# Patient Record
Sex: Male | Born: 1955 | Race: White | Hispanic: No | Marital: Married | Smoking: Current every day smoker
Health system: Southern US, Community
[De-identification: ages and names within clinical notes are randomized; demographics above are authoritative.]

## PROBLEM LIST (undated history)

## (undated) DIAGNOSIS — F1020 Alcohol dependence, uncomplicated: Secondary | ICD-10-CM

## (undated) DIAGNOSIS — R569 Unspecified convulsions: Secondary | ICD-10-CM

## (undated) DIAGNOSIS — G252 Other specified forms of tremor: Secondary | ICD-10-CM

## (undated) DIAGNOSIS — R4189 Other symptoms and signs involving cognitive functions and awareness: Secondary | ICD-10-CM

## (undated) DIAGNOSIS — S069XAA Unspecified intracranial injury with loss of consciousness status unknown, initial encounter: Secondary | ICD-10-CM

## (undated) DIAGNOSIS — Z720 Tobacco use: Secondary | ICD-10-CM

## (undated) DIAGNOSIS — F32A Depression, unspecified: Secondary | ICD-10-CM

## (undated) DIAGNOSIS — F329 Major depressive disorder, single episode, unspecified: Secondary | ICD-10-CM

## (undated) DIAGNOSIS — S069X9A Unspecified intracranial injury with loss of consciousness of unspecified duration, initial encounter: Secondary | ICD-10-CM

---

## 2006-01-09 ENCOUNTER — Emergency Department: Payer: Self-pay | Admitting: Emergency Medicine

## 2011-10-25 DIAGNOSIS — G250 Essential tremor: Secondary | ICD-10-CM | POA: Insufficient documentation

## 2012-09-17 DIAGNOSIS — F32A Depression, unspecified: Secondary | ICD-10-CM | POA: Insufficient documentation

## 2012-09-17 DIAGNOSIS — F329 Major depressive disorder, single episode, unspecified: Secondary | ICD-10-CM | POA: Insufficient documentation

## 2015-02-01 ENCOUNTER — Inpatient Hospital Stay (HOSPITAL_COMMUNITY)
Admit: 2015-02-01 | Discharge: 2015-02-01 | Disposition: A | Discharge status: Home / Self Care | Payer: Medicaid Other | Attending: Internal Medicine | Admitting: Internal Medicine

## 2015-02-01 ENCOUNTER — Inpatient Hospital Stay
Admission: EM | Admit: 2015-02-01 | Discharge: 2015-02-10 | DRG: 871 | Disposition: A | Discharge status: Home / Self Care | Payer: Medicaid Other | Attending: Internal Medicine | Admitting: Internal Medicine

## 2015-02-01 ENCOUNTER — Encounter: Payer: Self-pay | Admitting: *Deleted

## 2015-02-01 ENCOUNTER — Emergency Department: Payer: Medicaid Other

## 2015-02-01 DIAGNOSIS — B441 Other pulmonary aspergillosis: Secondary | ICD-10-CM | POA: Diagnosis present

## 2015-02-01 DIAGNOSIS — Z79899 Other long term (current) drug therapy: Secondary | ICD-10-CM

## 2015-02-01 DIAGNOSIS — Z452 Encounter for adjustment and management of vascular access device: Secondary | ICD-10-CM

## 2015-02-01 DIAGNOSIS — A419 Sepsis, unspecified organism: Secondary | ICD-10-CM | POA: Diagnosis present

## 2015-02-01 DIAGNOSIS — F17210 Nicotine dependence, cigarettes, uncomplicated: Secondary | ICD-10-CM | POA: Diagnosis present

## 2015-02-01 DIAGNOSIS — Z681 Body mass index (BMI) 19 or less, adult: Secondary | ICD-10-CM | POA: Diagnosis not present

## 2015-02-01 DIAGNOSIS — I4891 Unspecified atrial fibrillation: Secondary | ICD-10-CM | POA: Diagnosis present

## 2015-02-01 DIAGNOSIS — E43 Unspecified severe protein-calorie malnutrition: Secondary | ICD-10-CM | POA: Insufficient documentation

## 2015-02-01 DIAGNOSIS — G250 Essential tremor: Secondary | ICD-10-CM | POA: Diagnosis present

## 2015-02-01 DIAGNOSIS — R059 Cough, unspecified: Secondary | ICD-10-CM

## 2015-02-01 DIAGNOSIS — F1010 Alcohol abuse, uncomplicated: Secondary | ICD-10-CM | POA: Diagnosis present

## 2015-02-01 DIAGNOSIS — Z7982 Long term (current) use of aspirin: Secondary | ICD-10-CM

## 2015-02-01 DIAGNOSIS — G40909 Epilepsy, unspecified, not intractable, without status epilepticus: Secondary | ICD-10-CM | POA: Diagnosis present

## 2015-02-01 DIAGNOSIS — D638 Anemia in other chronic diseases classified elsewhere: Secondary | ICD-10-CM | POA: Diagnosis present

## 2015-02-01 DIAGNOSIS — A4189 Other specified sepsis: Secondary | ICD-10-CM | POA: Diagnosis present

## 2015-02-01 DIAGNOSIS — I240 Acute coronary thrombosis not resulting in myocardial infarction: Secondary | ICD-10-CM | POA: Diagnosis not present

## 2015-02-01 DIAGNOSIS — J9601 Acute respiratory failure with hypoxia: Secondary | ICD-10-CM | POA: Diagnosis present

## 2015-02-01 DIAGNOSIS — J984 Other disorders of lung: Secondary | ICD-10-CM | POA: Diagnosis present

## 2015-02-01 DIAGNOSIS — J189 Pneumonia, unspecified organism: Secondary | ICD-10-CM

## 2015-02-01 DIAGNOSIS — E876 Hypokalemia: Secondary | ICD-10-CM | POA: Diagnosis present

## 2015-02-01 DIAGNOSIS — R0600 Dyspnea, unspecified: Secondary | ICD-10-CM

## 2015-02-01 DIAGNOSIS — R6521 Severe sepsis with septic shock: Secondary | ICD-10-CM | POA: Diagnosis present

## 2015-02-01 DIAGNOSIS — E46 Unspecified protein-calorie malnutrition: Secondary | ICD-10-CM | POA: Diagnosis present

## 2015-02-01 DIAGNOSIS — I82623 Acute embolism and thrombosis of deep veins of upper extremity, bilateral: Secondary | ICD-10-CM | POA: Diagnosis not present

## 2015-02-01 DIAGNOSIS — I340 Nonrheumatic mitral (valve) insufficiency: Secondary | ICD-10-CM

## 2015-02-01 DIAGNOSIS — M7989 Other specified soft tissue disorders: Secondary | ICD-10-CM

## 2015-02-01 DIAGNOSIS — R600 Localized edema: Secondary | ICD-10-CM

## 2015-02-01 DIAGNOSIS — R05 Cough: Secondary | ICD-10-CM

## 2015-02-01 DIAGNOSIS — R64 Cachexia: Secondary | ICD-10-CM | POA: Diagnosis present

## 2015-02-01 HISTORY — DX: Unspecified convulsions: R56.9

## 2015-02-01 LAB — COMPREHENSIVE METABOLIC PANEL
ALBUMIN: 2.1 g/dL — AB (ref 3.5–5.0)
ALT: 13 U/L — AB (ref 17–63)
AST: 26 U/L (ref 15–41)
Alkaline Phosphatase: 105 U/L (ref 38–126)
Anion gap: 12 (ref 5–15)
BILIRUBIN TOTAL: 0.5 mg/dL (ref 0.3–1.2)
BUN: 14 mg/dL (ref 6–20)
CHLORIDE: 99 mmol/L — AB (ref 101–111)
CO2: 24 mmol/L (ref 22–32)
CREATININE: 0.97 mg/dL (ref 0.61–1.24)
Calcium: 8.3 mg/dL — ABNORMAL LOW (ref 8.9–10.3)
GFR calc Af Amer: >60 mL/min (ref >60)
GLUCOSE: 133 mg/dL — AB (ref 65–99)
Potassium: 3.3 mmol/L — ABNORMAL LOW (ref 3.5–5.1)
Sodium: 135 mmol/L (ref 135–145)
Total Protein: 6.6 g/dL (ref 6.5–8.1)

## 2015-02-01 LAB — TROPONIN I
TROPONIN I: 0.09 ng/mL — AB (ref <0.031)
Troponin I: 2.59 ng/mL — ABNORMAL HIGH (ref <0.031)

## 2015-02-01 LAB — RAPID HIV SCREEN (HIV 1/2 AB+AG)
HIV 1/2 ANTIBODIES: NONREACTIVE
HIV-1 P24 ANTIGEN - HIV24: NONREACTIVE

## 2015-02-01 LAB — CBC WITH DIFFERENTIAL/PLATELET
BASOS ABS: 0 10*3/uL (ref 0–0.1)
Basophils Relative: 0 %
Eosinophils Absolute: 0.3 10*3/uL (ref 0–0.7)
Eosinophils Relative: 1 %
HEMATOCRIT: 30.3 % — AB (ref 40.0–52.0)
Hemoglobin: 10.3 g/dL — ABNORMAL LOW (ref 13.0–18.0)
LYMPHS PCT: 1 %
Lymphs Abs: 0.3 10*3/uL — ABNORMAL LOW (ref 1.0–3.6)
MCH: 30.8 pg (ref 26.0–34.0)
MCHC: 33.9 g/dL (ref 32.0–36.0)
MCV: 90.9 fL (ref 80.0–100.0)
MONOS PCT: 4 %
Monocytes Absolute: 1.2 10*3/uL — ABNORMAL HIGH (ref 0.2–1.0)
Neutro Abs: 28.5 10*3/uL — ABNORMAL HIGH (ref 1.4–6.5)
Neutrophils Relative %: 94 %
PLATELETS: 613 10*3/uL — AB (ref 150–440)
RBC: 3.33 MIL/uL — AB (ref 4.40–5.90)
RDW: 14.7 % — ABNORMAL HIGH (ref 11.5–14.5)
WBC: 30.3 10*3/uL — AB (ref 3.8–10.6)

## 2015-02-01 LAB — PROTIME-INR
INR: 1.41
Prothrombin Time: 17.5 seconds — ABNORMAL HIGH (ref 11.4–15.0)

## 2015-02-01 LAB — URINE DRUG SCREEN, QUALITATIVE (ARMC ONLY)
AMPHETAMINES, UR SCREEN: NOT DETECTED
Barbiturates, Ur Screen: POSITIVE — AB
Benzodiazepine, Ur Scrn: NOT DETECTED
COCAINE METABOLITE, UR ~~LOC~~: NOT DETECTED
Cannabinoid 50 Ng, Ur ~~LOC~~: NOT DETECTED
MDMA (ECSTASY) UR SCREEN: NOT DETECTED
METHADONE SCREEN, URINE: NOT DETECTED
OPIATE, UR SCREEN: NOT DETECTED
Phencyclidine (PCP) Ur S: NOT DETECTED
TRICYCLIC, UR SCREEN: NOT DETECTED

## 2015-02-01 LAB — TSH: TSH: 0.916 u[IU]/mL (ref 0.350–4.500)

## 2015-02-01 LAB — URINALYSIS COMPLETE WITH MICROSCOPIC (ARMC ONLY)
BILIRUBIN URINE: NEGATIVE
GLUCOSE, UA: NEGATIVE mg/dL
KETONES UR: NEGATIVE mg/dL
NITRITE: NEGATIVE
Protein, ur: 100 mg/dL — AB
SPECIFIC GRAVITY, URINE: 1.02 (ref 1.005–1.030)
pH: 5 (ref 5.0–8.0)

## 2015-02-01 LAB — APTT: APTT: 59 s — AB (ref 24–36)

## 2015-02-01 LAB — ACETAMINOPHEN LEVEL: Acetaminophen (Tylenol), Serum: <10 ug/mL — ABNORMAL LOW (ref 10–30)

## 2015-02-01 LAB — PHOSPHORUS: PHOSPHORUS: 3.4 mg/dL (ref 2.5–4.6)

## 2015-02-01 LAB — T4, FREE: FREE T4: 0.91 ng/dL (ref 0.61–1.12)

## 2015-02-01 LAB — ETHANOL: Alcohol, Ethyl (B): <5 mg/dL (ref <5)

## 2015-02-01 LAB — EXPECTORATED SPUTUM ASSESSMENT W REFEX TO RESP CULTURE

## 2015-02-01 LAB — MRSA PCR SCREENING: MRSA by PCR: NEGATIVE

## 2015-02-01 LAB — CORTISOL: Cortisol, Plasma: 30.8 ug/dL

## 2015-02-01 LAB — SALICYLATE LEVEL: SALICYLATE LVL: 6.3 mg/dL (ref 2.8–30.0)

## 2015-02-01 LAB — GLUCOSE, CAPILLARY: Glucose-Capillary: 99 mg/dL (ref 65–99)

## 2015-02-01 LAB — LACTIC ACID, PLASMA
Lactic Acid, Venous: 2.2 mmol/L (ref 0.5–2.0)
Lactic Acid, Venous: 1.6 mmol/L (ref 0.5–2.0)

## 2015-02-01 LAB — EXPECTORATED SPUTUM ASSESSMENT W GRAM STAIN, RFLX TO RESP C

## 2015-02-01 LAB — MAGNESIUM: MAGNESIUM: 1.9 mg/dL (ref 1.7–2.4)

## 2015-02-01 MED ORDER — VANCOMYCIN HCL IN DEXTROSE 750-5 MG/150ML-% IV SOLN
750.0000 mg | Freq: Two times a day (BID) | INTRAVENOUS | Status: DC
  Administered 2015-02-01 – 2015-02-03 (×5): 750 mg via INTRAVENOUS
  Filled 2015-02-01 (×6): qty 150

## 2015-02-01 MED ORDER — SODIUM CHLORIDE 0.9 % IJ SOLN
3.0000 mL | Freq: Two times a day (BID) | INTRAMUSCULAR | Status: DC
  Administered 2015-02-01 – 2015-02-09 (×12): 3 mL via INTRAVENOUS

## 2015-02-01 MED ORDER — VANCOMYCIN HCL IN DEXTROSE 1-5 GM/200ML-% IV SOLN
1000.0000 mg | Freq: Once | INTRAVENOUS | Status: AC
  Administered 2015-02-01: 1000 mg via INTRAVENOUS
  Filled 2015-02-01: qty 200

## 2015-02-01 MED ORDER — ENOXAPARIN SODIUM 40 MG/0.4ML ~~LOC~~ SOLN
40.0000 mg | SUBCUTANEOUS | Status: DC
  Administered 2015-02-01 – 2015-02-10 (×10): 40 mg via SUBCUTANEOUS
  Filled 2015-02-01 (×10): qty 0.4

## 2015-02-01 MED ORDER — ANTISEPTIC ORAL RINSE SOLUTION (CORINZ)
7.0000 mL | Freq: Four times a day (QID) | OROMUCOSAL | Status: DC
Start: 2015-02-02 — End: 2015-02-04
  Administered 2015-02-02 – 2015-02-04 (×9): 7 mL via OROMUCOSAL
  Filled 2015-02-01 (×14): qty 7

## 2015-02-01 MED ORDER — ACETAMINOPHEN 325 MG PO TABS
650.0000 mg | ORAL_TABLET | Freq: Four times a day (QID) | ORAL | Status: DC | PRN
  Filled 2015-02-01: qty 2

## 2015-02-01 MED ORDER — SODIUM CHLORIDE 0.9 % IV BOLUS (SEPSIS)
500.0000 mL | Freq: Once | INTRAVENOUS | Status: AC
  Administered 2015-02-01: 500 mL via INTRAVENOUS

## 2015-02-01 MED ORDER — ACETAMINOPHEN 650 MG RE SUPP
650.0000 mg | Freq: Four times a day (QID) | RECTAL | Status: DC | PRN
Start: 2015-02-01 — End: 2015-02-10

## 2015-02-01 MED ORDER — CITALOPRAM HYDROBROMIDE 20 MG PO TABS
20.0000 mg | ORAL_TABLET | ORAL | Status: DC
  Administered 2015-02-01 – 2015-02-10 (×10): 20 mg via ORAL
  Filled 2015-02-01 (×10): qty 1

## 2015-02-01 MED ORDER — ADULT MULTIVITAMIN W/MINERALS CH
1.0000 | ORAL_TABLET | Freq: Every day | ORAL | Status: DC
  Administered 2015-02-01 – 2015-02-10 (×10): 1 via ORAL
  Filled 2015-02-01 (×10): qty 1

## 2015-02-01 MED ORDER — SODIUM CHLORIDE 0.9 % IV BOLUS (SEPSIS)
1000.0000 mL | Freq: Once | INTRAVENOUS | Status: AC
Start: 2015-02-01 — End: 2015-02-01
  Administered 2015-02-01: 1000 mL via INTRAVENOUS

## 2015-02-01 MED ORDER — SODIUM CHLORIDE 0.9 % IV SOLN
INTRAVENOUS | Status: DC
  Administered 2015-02-01 – 2015-02-08 (×13): via INTRAVENOUS

## 2015-02-01 MED ORDER — TOPIRAMATE 25 MG PO TABS
25.0000 mg | ORAL_TABLET | Freq: Two times a day (BID) | ORAL | Status: DC
  Administered 2015-02-01 – 2015-02-10 (×19): 25 mg via ORAL
  Filled 2015-02-01 (×19): qty 1

## 2015-02-01 MED ORDER — POTASSIUM CHLORIDE 20 MEQ/15ML (10%) PO SOLN
40.0000 meq | Freq: Once | ORAL | Status: AC
  Administered 2015-02-01: 40 meq via ORAL
  Filled 2015-02-01: qty 30

## 2015-02-01 MED ORDER — VITAMIN B-1 100 MG PO TABS
100.0000 mg | ORAL_TABLET | Freq: Every day | ORAL | Status: DC
  Administered 2015-02-01 – 2015-02-10 (×10): 100 mg via ORAL
  Filled 2015-02-01 (×10): qty 1

## 2015-02-01 MED ORDER — NICOTINE 21 MG/24HR TD PT24
21.0000 mg | MEDICATED_PATCH | Freq: Every day | TRANSDERMAL | Status: DC
  Administered 2015-02-01 – 2015-02-10 (×8): 21 mg via TRANSDERMAL
  Filled 2015-02-01 (×9): qty 1

## 2015-02-01 MED ORDER — SENNOSIDES-DOCUSATE SODIUM 8.6-50 MG PO TABS: 1.0000 | ORAL_TABLET | Freq: Every evening | ORAL | Status: DC | PRN

## 2015-02-01 MED ORDER — FOLIC ACID 1 MG PO TABS
1.0000 mg | ORAL_TABLET | Freq: Every day | ORAL | Status: DC
  Administered 2015-02-01 – 2015-02-10 (×10): 1 mg via ORAL
  Filled 2015-02-01 (×10): qty 1

## 2015-02-01 MED ORDER — SODIUM CHLORIDE 0.9 % IV BOLUS (SEPSIS)
1551.0000 mL | Freq: Once | INTRAVENOUS | Status: AC
  Administered 2015-02-01: 1551 mL via INTRAVENOUS

## 2015-02-01 MED ORDER — ASPIRIN 81 MG PO CHEW
324.0000 mg | CHEWABLE_TABLET | Freq: Once | ORAL | Status: AC
  Administered 2015-02-01: 324 mg via ORAL

## 2015-02-01 MED ORDER — PIPERACILLIN-TAZOBACTAM 3.375 G IVPB
3.3750 g | Freq: Once | INTRAVENOUS | Status: AC
  Administered 2015-02-01: 3.375 g via INTRAVENOUS
  Filled 2015-02-01: qty 50

## 2015-02-01 MED ORDER — PRIMIDONE 50 MG PO TABS
200.0000 mg | ORAL_TABLET | Freq: Every day | ORAL | Status: DC
  Administered 2015-02-01 – 2015-02-09 (×9): 200 mg via ORAL
  Filled 2015-02-01 (×9): qty 4

## 2015-02-01 MED ORDER — ASPIRIN 81 MG PO CHEW
CHEWABLE_TABLET | ORAL | Status: AC
  Filled 2015-02-01: qty 4

## 2015-02-01 MED ORDER — PIPERACILLIN-TAZOBACTAM 3.375 G IVPB
3.3750 g | Freq: Three times a day (TID) | INTRAVENOUS | Status: DC
  Administered 2015-02-01 – 2015-02-07 (×18): 3.375 g via INTRAVENOUS
  Filled 2015-02-01 (×22): qty 50

## 2015-02-01 MED ORDER — LORAZEPAM 2 MG/ML IJ SOLN
1.0000 mg | INTRAMUSCULAR | Status: DC | PRN
  Administered 2015-02-01: 1 mg via INTRAVENOUS
  Administered 2015-02-02 – 2015-02-06 (×3): 2 mg via INTRAVENOUS
  Filled 2015-02-01 (×4): qty 1

## 2015-02-01 MED ORDER — HYDROCODONE-ACETAMINOPHEN 5-325 MG PO TABS: 1.0000 | ORAL_TABLET | ORAL | Status: DC | PRN
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
## 2015-02-01 NOTE — Progress Notes (Signed)
Pt remains alert and oriented on airborne precautions for potential tuberculosis; AFB sent to lab upon pts arrival to ICU; uop minimal notified Dr Benjie Karvonen given orders to increase NS to 150 ml/hr; pt had 13 second pause and is currently in second degree heart block type 1 cardiology consult placed per Dr. Benjie Karvonen; notified Dr Ubaldo Glassing of elevated troponin level; other vss; no c/o pain; pt showing signs of tremors per Dr. Benjie Karvonen placed CIWA orders will administer ativan as prescribed; will continue to monitor and assess pt

## 2015-02-01 NOTE — ED Provider Notes (Addendum)
[facility] Emergency Department Provider Note  ____________________________________________  Time seen: Approximately 7:28 AM  I have reviewed the triage vital signs and the nursing notes.   HISTORY  Chief Complaint Psychiatric Evaluation and Shortness of Breath  Caveat-history of present illness and review of systems Limited secondary to the patient's somewhat tangential speech and inability to stay on topic.  HPI Jonathan [name] is a 59 y.o. male with history of essential tremor, history of seizure disorder, history of alcohol abuse who presents for evaluation of feeling unsafe at home. When asked what exactly he means he reports "it's a long story. nobody in the community wants to do anything for my family. There are people who think I have AIDS because I have lost so much weight". He denies any audiovisual hallucinations, SI or HI. He is unable to tell me exactly why he called the ambulance this morning however on EMS arrival he was coughing, appeared short of breath and tachypnea and was hypoxic requiring supplemental oxygen. He reports he has been coughing for 2-3 weeks and has been feeling "hot and cold". He reports that his sputum is blood-tinged. He denies any vomiting or diarrhea. He denies any chest pain or difficulty breathing. His last alcoholic drink was "a few days ago". Current severity of symptoms is moderate. No modifying factors.   Past Medical History  Diagnosis Date  . Seizures     There are no active problems to display for this patient.   History reviewed. No pertinent past surgical history.  Current Outpatient Rx  Name  Route  Sig  Dispense  Refill  . citalopram (CELEXA) 20 MG tablet   Oral   Take 20 mg by mouth every morning.         . primidone (MYSOLINE) 50 MG tablet   Oral   Take 200 mg by mouth at bedtime.         . topiramate (TOPAMAX) 25 MG tablet   Oral   Take 25 mg by mouth 2 (two) times daily. 1 tablet in the  morning and the second tablet 6 hours after the first           Allergies Review of patient's allergies indicates no known allergies.  History reviewed. No pertinent family history.  Social History Social History  Substance Use Topics  . Smoking status: Current Every Day Smoker  . Smokeless tobacco: None  . Alcohol Use: None    Review of Systems Constitutional: +  subjective fever/chills Eyes: No visual changes. ENT: No sore throat. Cardiovascular: Denies chest pain. Respiratory: Denies shortness of breath. Gastrointestinal: No abdominal pain.  No nausea, no vomiting.  No diarrhea.  No constipation. Genitourinary: Negative for dysuria. Musculoskeletal: Negative for back pain. Skin: Negative for rash. Neurological: Negative for headaches, focal weakness or numbness.   Caveat-history of present illness and review of systems Limited secondary to the patient's somewhat tangential speech and inability to stay on topic. ____________________________________________   PHYSICAL EXAM:  VITAL SIGNS: ED Triage Vitals  Enc Vitals Group     BP 02/01/15 0719 97/79 mmHg     Pulse Rate 02/01/15 0719 130     Resp 02/01/15 0719 22     Temp 02/01/15 0719 98.5 F (36.9 C)     Temp Source 02/01/15 0719 Oral     SpO2 02/01/15 0717 95 %     Weight 02/01/15 0719 114 lb (51.71 kg)     Height 02/01/15 0719 5\' 9"  (1.753 m)  Head Cir --      Peak Flow --      Pain Score --      Pain Loc --      Pain Edu? --      Excl. in [place]? --     Constitutional: Alert and oriented x 3. The patient is ill-appearing, thin, cachectic, diaphoretic. Coughing up purulent blood-tinged mucus. Eyes: Conjunctivae are normal. PERRL. EOMI. Head: Atraumatic. Nose: No congestion/rhinnorhea. Mouth/Throat: Mucous membranes are dry.  Oropharynx non-erythematous. Neck: No stridor.  Cardiovascular: tachycardic rate, regular rhythm. Grossly normal heart sounds.  Good peripheral circulation. Respiratory:  Tachypnea with slightly increased work of breathing, globally diminished breath sounds bilaterally. Gastrointestinal: Soft and nontender. No distention. No abdominal bruits. No CVA tenderness. Genitourinary: Deferred Musculoskeletal: No lower extremity tenderness nor edema.  No joint effusions. Neurologic:  Normal speech and language. No gross focal neurologic deficits are appreciated.  Skin:  Skin is warm, dry and intact. No rash noted. Psychiatric: patient appears somewhat paranoid with flat affect.  ____________________________________________   LABS (all labs ordered are listed, but only abnormal results are displayed)  Labs Reviewed  CBC WITH DIFFERENTIAL/PLATELET - Abnormal; Notable for the following:    WBC 30.3 (*)    RBC 3.33 (*)    Hemoglobin 10.3 (*)    HCT 30.3 (*)    RDW 14.7 (*)    Platelets 613 (*)    Neutro Abs 28.5 (*)    Lymphs Abs 0.3 (*)    Monocytes Absolute 1.2 (*)    All other components within normal limits  COMPREHENSIVE METABOLIC PANEL - Abnormal; Notable for the following:    Potassium 3.3 (*)    Chloride 99 (*)    Glucose, Bld 133 (*)    Calcium 8.3 (*)    Albumin 2.1 (*)    ALT 13 (*)    All other components within normal limits  TROPONIN I - Abnormal; Notable for the following:    Troponin I 0.09 (*)    All other components within normal limits  URINALYSIS COMPLETEWITH MICROSCOPIC ([facility] ONLY) - Abnormal; Notable for the following:    Color, Urine AMBER (*)    APPearance CLOUDY (*)    Hgb urine dipstick 1+ (*)    Protein, ur 100 (*)    Leukocytes, UA TRACE (*)    Bacteria, UA RARE (*)    Squamous Epithelial / LPF 0-5 (*)    All other components within normal limits  APTT - Abnormal; Notable for the following:    aPTT 59 (*)    All other components within normal limits  PROTIME-INR - Abnormal; Notable for the following:    Prothrombin Time 17.5 (*)    All other components within normal limits  URINE DRUG SCREEN, QUALITATIVE ([facility] ONLY) -  Abnormal; Notable for the following:    Barbiturates, Ur Screen POSITIVE (*)    All other components within normal limits  CULTURE, BLOOD (ROUTINE X 2)  CULTURE, BLOOD (ROUTINE X 2)  URINE CULTURE  CULTURE, EXPECTORATED SPUTUM-ASSESSMENT  ACID FAST SMEAR+CULTURE W/RFLX ([facility] ONLY)  LACTIC ACID, PLASMA  LACTIC ACID, PLASMA  TSH   ____________________________________________  EKG  ED ECG REPORT I, Joanne Gavel, the attending physician, personally viewed and interpreted this ECG.   Date: 02/01/2015  EKG Time: 07:32  Rate: 124  Rhythm: sinus tachycardia  Axis: normal  Intervals:none  ST&T Change: No acute ST elevation  ____________________________________________  RADIOLOGY  CXR IMPRESSION: Consolidation of the left upper lobe with cavitation.  The findings are likely infectious in nature, TB is not excluded based on the x-ray.  ____________________________________________   PROCEDURES  Procedure(s) performed: None  Critical Care performed: Yes, see critical care note(s). Total critical care time spent 35 minutes.  ____________________________________________   INITIAL IMPRESSION / ASSESSMENT AND PLAN / ED COURSE  Pertinent labs & imaging results that were available during my care of the patient were reviewed by me and considered in my medical decision making (see chart for details).  CRU KRITIKOS is a 59 y.o. male with history of essential tremor, history of seizure disorder, history of alcohol abuse who presents for evaluation of feeling unsafe at home. On exam, he is very ill-appearing. He is diaphoretic, thin and cachectic, tachypnea, tachycardic and hypotensive. The minutes breath sounds, is coughing up frothy purulent sputum with blood in it. Benign abdominal exam, intact neurological examination. Currently meeting 2 out of 4 Sirs criteria, we'll treat empirically with IV fluids, make a mycin and Zosyn. He may be suffering from delirium in the setting of  acute delirium tremens however I would not expect for him to be hypotensive in acute alcohol withdrawal. Will check CIWA scores. Awaiting urinalysis, chest x-ray, labs, anticipate admission. Additionally, given his complaints of night sweats, weight loss, bloody sputum, there is concern for possible TB. He is on isolation and has been placed in a negative pressure room. AFB ordered.  ----------------------------------------- 9:30 AM on 02/01/2015 -----------------------------------------  Heart rate and blood pressure improved with the above treatments. Troponin elevated at 0.09. Aspirin ordered. Labs notable for significant leukocytosis with white count 30,000. Urinalysis also with multiple red blood cells, multiple white blood cells in clumps. Chest x-ray with left upper lobe consolidation with cavitation likely represents pneumonia versus active tuberculosis. Discussed with Dr. Earleen Newport, hospitalist, for admission at this time.  ____________________________________________   FINAL CLINICAL IMPRESSION(S) / ED DIAGNOSES  Final diagnoses:  Cough  Sepsis, due to unspecified organism  Community acquired pneumonia      Joanne Gavel, MD 02/01/15 1314  Joanne Gavel, MD 02/01/15 1558

## 2015-02-01 NOTE — ED Notes (Signed)
Pt placed in room 1 with negative pressure, pt placed on airborne precautions, pt placed on 2L [place]

## 2015-02-01 NOTE — ED Notes (Addendum)
Pt arrives via EMS for "not feeling safe at home", when asked why pt states "well would you?", pt unable to elaborate stating "its a long story", pt also presents with SOB, productive cough, EMS placed pt on 3L [place], pt states "someone [place] thinks [place] have AIDS"

## 2015-02-01 NOTE — H&P (Signed)
[facility] [place] NAME: Jonathan [name]    MR#:  [id number]  DATE OF BIRTH:  1955-07-25  DATE OF ADMISSION:  02/01/2015  PRIMARY CARE PHYSICIAN: No primary care provider on file.   REQUESTING/REFERRING PHYSICIAN: dr Edd Fabian  CHIEF COMPLAINT:  Shortness of breath  HISTORY OF PRESENT ILLNESS:  Jonathan [name]  is a 59 y.o. male with a known history of tobacco abuse and seizure disorder who presented to the emergency room shortness of breath.  Patient patient endorses shortness of breath as well as weight loss as well as night sweats. Upon arrival EMS he was coughing and appears short of breath with tachypnea. He was found to have hypoxia. Chest x-ray showed left lobe consolidation with possible tuberculosis. He was started on Zosyn and vancomycin. He also reported blood-tinged sputum.  PAST MEDICAL HISTORY:   Past Medical History  Diagnosis Date  . Seizures     PAST SURGICAL HISTORY:  None  SOCIAL HISTORY:   He smokes one pack a day. Occasional alcohol use.  FAMILY HISTORY:  Unknown to the patient  DRUG ALLERGIES:  No Known Allergies   REVIEW OF SYSTEMS:  CONSTITUTIONAL: No fever, positive weight loss ,fatigue and weakness.  EYES: No blurred or double vision.  EARS, NOSE, AND THROAT: No tinnitus or ear pain.  RESPIRATORY: Positive cough, shortness of breath, no wheezing  positive hemoptysis.  CARDIOVASCULAR: No chest pain, orthopnea, edema.  GASTROINTESTINAL: No nausea, vomiting, diarrhea or abdominal pain.  GENITOURINARY: No dysuria, hematuria.  ENDOCRINE: No polyuria, nocturia,  HEMATOLOGY: No anemia, easy bruising or bleeding SKIN: No rash or lesion. MUSCULOSKELETAL: No joint pain or arthritis.   NEUROLOGIC: No tingling, numbness, weakness.  PSYCHIATRY: No anxiety or depression. Tangential speech  MEDICATIONS AT HOME:   Prior to Admission medications   Medication Sig Start Date End Date Taking? Authorizing  Provider  citalopram (CELEXA) 20 MG tablet Take 20 mg by mouth every morning.   Yes Historical Provider, MD  primidone (MYSOLINE) 50 MG tablet Take 200 mg by mouth at bedtime.   Yes Historical Provider, MD  topiramate (TOPAMAX) 25 MG tablet Take 25 mg by mouth 2 (two) times daily. 1 tablet in the morning and the second tablet 6 hours after the first   Yes Historical Provider, MD      VITAL SIGNS:  Blood pressure 107/69, pulse 105, temperature 98.5 F (36.9 C), temperature source Oral, resp. rate 24, height 5\' 9"  (1.753 m), weight 51.71 kg (114 lb), SpO2 97 %.  PHYSICAL EXAMINATION:  GENERAL:  59 y.o.-year-old patient lying in the bed with no acute distress. Frail appearing EYES: Pupils equal, round, reactive to light and accommodation. No scleral icterus. Extraocular muscles intact.  HEENT: Head atraumatic, normocephalic. Oropharynx and nasopharynx clear.  NECK:  Supple, no jugular venous distention. No thyroid enlargement, no tenderness.  LUNGS: Decreased breath sounds left base no wheezing, rales,rhonchi or crepitation. No use of accessory muscles of respiration.  CARDIOVASCULAR: S1, S2 normal. No murmurs, rubs, or gallops.  ABDOMEN: Soft, nontender, nondistended. Bowel sounds present. No organomegaly or mass.  EXTREMITIES: No pedal edema, cyanosis, or clubbing.  NEUROLOGIC: Cranial nerves II through XII are grossly intact. No focal deficits. PSYCHIATRIC: The patient is alert and oriented x 3.  SKIN: No obvious rash, lesion, or ulcer.   LABORATORY PANEL:   CBC  Recent Labs Lab 02/01/15 0734  WBC 30.3*  HGB 10.3*  HCT 30.3*  PLT 613*   ------------------------------------------------------------------------------------------------------------------  Chemistries  Recent Labs Lab 02/01/15 0734  NA 135  K 3.3*  CL 99*  CO2 24  GLUCOSE 133*  BUN 14  CREATININE 0.97  CALCIUM 8.3*  AST 26  ALT 13*  ALKPHOS 105  BILITOT 0.5    ------------------------------------------------------------------------------------------------------------------  Cardiac Enzymes  Recent Labs Lab 02/01/15 0734  TROPONINI 0.09*   ------------------------------------------------------------------------------------------------------------------  RADIOLOGY:  Dg Chest Portable 1 View  02/01/2015   CLINICAL DATA:  Productive cough, shortness of breath, assess for TB  EXAM: PORTABLE CHEST - 1 VIEW  COMPARISON:  None.  FINDINGS: There is consolidation of the left upper lobe with cavitation. The right lung is clear. There is no pleural effusion bilaterally. The mediastinal contour and cardiac silhouette are normal. The bony structures are normal.  IMPRESSION: Consolidation of the left upper lobe with cavitation. The findings are likely infectious in nature, TB is not excluded based on the x-ray.   Electronically Signed   By: Abelardo Diesel M.D.   On: 02/01/2015 09:15    EKG:  Sinus tachycardia no ST elevations or depression.  IMPRESSION AND PLAN:  59 year old male with tobacco dependence and seizure disorder who presents with hypoxia and found to have consolidation of the left upper lobe with cavitation.  1. Sepsis: Patient presents with pneumonia along with elevation in his white cell count and hypoxia. Continue IV fluids and follow up on blood cultures. Continue Zosyn and vancomycin.  2. Left upper lobe cavitary lesion with pneumonia: Patient is on Zosyn and vancomycin which I'll continue. Patient needs to be ruled out for tuberculosis. Patient is placed in TB isolation. AFBs have been ordered. I will also consult infectious disease for further management. Check HIV 3. Leukocytosis: This is secondary to problem #2. Continue to monitor CBC.  4. Tobacco dependence: Patient was counseled for stopping smoking. Patient was consulted for 3 minutes. Nicotine patch will be placed.  5. Elevation in troponin: This could be supply demand ischemia.  I will continue to trend up on his.  6. Hypotension: Patient's blood pressure was low in the ER. I will continue with IV fluids. Hypotension likely secondary sepsis.  7. Sinus tachycardia: Likely secondary to sepsis and pneumonia. Continue to monitor on telemetry.  8. Hypokalemia: Potassium will be repleted. Check magnesium and phosphorus as well.  All the records are reviewed and case discussed with ED provider. Management plans discussed with the patient and he is in agreement.  CODE STATUS: FULL  TOTAL TIME TAKING CARE OF THIS PATIENT: 50 minutes.    Lavi Sheehan M.D on 02/01/2015 at 11:46 AM  Between 7am to 6pm - Pager - [phone] After 6pm go to [email or web address] - password [email or web address] [facility]  [facility]  Office  [phone]  CC: Primary care physician; No primary care provider on file.

## 2015-02-01 NOTE — Consult Note (Signed)
[facility]  CARDIOLOGY CONSULT NOTE  Patient ID: Jonathan [name] MRN: [id number] DOB/AGE: [birth year] 59 y.o.  Admit date: 02/01/2015 Referring Physician Mody Primary Physician   Primary Cardiologist   Reason for Consultation Abnormal troponin and reported "13 sec pause"  HPI: Pt is a 59 yo male who presented to the er with complaints of a 1 month history of cough, fevers and weight loss. He has a 1-2 pk year tobacco history and drinks frequently. He ha a essential tremor.  In the er he was noted to have mobitz 1 heart block and chart reports a "13 sec pause" with records of this not available. He has sinus tachycardia on ekg in the icu where he was placed on isolation. His serum troponin was 2.59. He denies chest pain and has not ischemia on ekg. Echo is pending. TSH is 0.916. HIv negative and tox screen is negative. He complains of weakness and cough. Lactate was 2.2.   ROS Review of Systems - History obtained from chart review and the patient General ROS: positive for  - chills, fatigue, fever, night sweats and weight loss Respiratory ROS: positive for - cough, pleuritic pain, shortness of breath, sputum changes and tachypnea Cardiovascular ROS: positive for - dyspnea on exertion and shortness of breath Gastrointestinal ROS: no abdominal pain, change in bowel habits, or black or bloody stools Neurological ROS: no TIA or stroke symptoms   Past Medical History  Diagnosis Date  . Seizures     History reviewed. No pertinent family history.  Social History   Social History  . Marital Status: Married    Spouse Name: N/A  . Number of Children: N/A  . Years of Education: N/A   Occupational History  . Not on file.   Social History Main Topics  . Smoking status: Current Every Day Smoker  . Smokeless tobacco: Not on file  . Alcohol Use: Not on file  . Drug Use: Not on file  . Sexual Activity: Not on file   Other Topics Concern  . Not on  file   Social History Narrative  . No narrative on file    History reviewed. No pertinent past surgical history.   Prescriptions prior to admission  Medication Sig Dispense Refill Last Dose  . citalopram (CELEXA) 20 MG tablet Take 20 mg by mouth every morning.   unknown  . primidone (MYSOLINE) 50 MG tablet Take 200 mg by mouth at bedtime.   01/31/2015 at pm  . topiramate (TOPAMAX) 25 MG tablet Take 25 mg by mouth 2 (two) times daily. 1 tablet in the morning and the second tablet 6 hours after the first   01/31/2015 at pm    Physical Exam: Blood pressure 121/103, pulse 129, temperature 100.8 F (38.2 C), temperature source Axillary, resp. rate 36, height 5\' 9"  (1.753 m), weight 47 kg (103 lb 9.9 oz), SpO2 100 %.    General appearance: cooperative Resp: rhonchi bibasilar Chest wall: no tenderness Cardio: regular rate and rhythm GI: soft, non-tender; bowel sounds normal; no masses,  no organomegaly Extremities: extremities normal, atraumatic, no cyanosis or edema Neurologic: Grossly normal Labs:   Lab Results  Component Value Date   WBC 30.3* 02/01/2015   HGB 10.3* 02/01/2015   HCT 30.3* 02/01/2015   MCV 90.9 02/01/2015   PLT 613* 02/01/2015    Recent Labs Lab 02/01/15 0734  NA 135  K 3.3*  CL 99*  CO2 24  BUN 14  CREATININE 0.97  CALCIUM 8.3*  PROT 6.6  BILITOT 0.5  ALKPHOS 105  ALT 13*  AST 26  GLUCOSE 133*   Lab Results  Component Value Date   TROPONINI 2.59* 02/01/2015      Radiology: Consolidation of lul with cavitation c/w infection and possible tb EKG: sinus tachycardia  ASSESSMENT AND PLAN:  Pt with multiple complaints with cough, sos, fever chills and weight loss. LUL cavittion on cxr. Has sinus tach on ekg c/w with sepsis. No evidence of prolonged pauses on telemetry. Elevated tropoinin is likely demand ischemia and does not appear to represent acs. Would continue to agressively treat infectious process as you are doing . Will follow heart rate and  hemodynamics.  Signed: Teodoro Spray MD, [facility] 02/01/2015, 6:52 PM

## 2015-02-01 NOTE — Progress Notes (Signed)
*  PRELIMINARY RESULTS* Echocardiogram 2D Echocardiogram has been performed.  Jonathan [name] 02/01/2015, 5:09 PM

## 2015-02-01 NOTE — ED Notes (Signed)
CRITICAL VALUE ALERT  Critical value received:  Lactic acid 2.2  Date of notification:  02/01/15  Time of notification:  0940  Critical value read back: yes  Nurse who received alert:  Narda Rutherford, RN  MD notified (1st page):    Time of first page:    MD notified (2nd page):  Time of second page:  Responding MD:  Edd Fabian, MD  Time MD responded:  [phone]

## 2015-02-01 NOTE — ED Notes (Signed)
CRITICAL VALUE ALERT  Critical value received:  Troponin 0.09  Date of notification:  02/01/15  Time of notification:  0825  Critical value read back: yes  Nurse who received alert:  Narda Rutherford, RN  MD notified (1st page):    Time of first page:    MD notified (2nd page):  Time of second page:  Responding MD:  Edd Fabian, MD  Time MD responded:  [phone]

## 2015-02-01 NOTE — Consult Note (Signed)
ANTIBIOTIC CONSULT NOTE - INITIAL  Pharmacy Consult for vancomycin/zosyn Indication: rule out pneumonia and rule out sepsis  No Known Allergies  Patient Measurements: Height: 5\' 9"  (175.3 cm) Weight: 114 lb (51.71 kg) IBW/kg (Calculated) : 70.7 Adjusted Body Weight:   Vital Signs: Temp: 98.5 F (36.9 C) (09/20 0719) Temp Source: Oral (09/20 0719) BP: 107/69 mmHg (09/20 1115) Pulse Rate: 105 (09/20 1115) Intake/Output from previous day:   Intake/Output from this shift:    Labs:  Recent Labs  02/01/15 0734  WBC 30.3*  HGB 10.3*  PLT 613*  CREATININE 0.97   Estimated Creatinine Clearance: 61.4 mL/min (by C-G formula based on Cr of 0.97). No results for input(s): VANCOTROUGH, VANCOPEAK, VANCORANDOM, GENTTROUGH, GENTPEAK, GENTRANDOM, TOBRATROUGH, TOBRAPEAK, TOBRARND, AMIKACINPEAK, AMIKACINTROU, AMIKACIN in the last 72 hours.   Microbiology: No results found for this or any previous visit (from the past 720 hour(s)).  Medical History: Past Medical History  Diagnosis Date  . Seizures     Medications:  Scheduled:  . aspirin      . citalopram  20 mg Oral BH-q7a  . enoxaparin (LOVENOX) injection  40 mg Subcutaneous Q24H  . nicotine  21 mg Transdermal Daily  . potassium chloride  40 mEq Oral Once  . primidone  200 mg Oral QHS  . sodium chloride  3 mL Intravenous Q12H  . topiramate  25 mg Oral BID   Assessment: Pt is a 59 year old male who presents with SOB, cough, x-ray showing left lobe consolidation with possible pneumonia or tuberculosis. Pt also reports blood-tinged soutum  Goal of Therapy:  Vancomycin trough level 15-20 mcg/ml  Plan:  Measure antibiotic drug levels at steady state Follow up culture results patient recieved 1 g of vancomycin in the ED. Will start vancomycin 750mg  q 12 hours . continue zosyn 3.375g q 8 hours EI infusion  Check trough at steady state. 9/22 at [place] 02/01/2015,12:06 PM

## 2015-02-02 ENCOUNTER — Inpatient Hospital Stay: Payer: Medicaid Other

## 2015-02-02 LAB — APTT: APTT: 50 s — AB (ref 24–36)

## 2015-02-02 LAB — PROTIME-INR
INR: 1.26
Prothrombin Time: 16 seconds — ABNORMAL HIGH (ref 11.4–15.0)

## 2015-02-02 LAB — BASIC METABOLIC PANEL
Anion gap: 3 — ABNORMAL LOW (ref 5–15)
BUN: 13 mg/dL (ref 6–20)
CALCIUM: 7.5 mg/dL — AB (ref 8.9–10.3)
CO2: 25 mmol/L (ref 22–32)
CREATININE: 0.71 mg/dL (ref 0.61–1.24)
Chloride: 106 mmol/L (ref 101–111)
GFR calc Af Amer: >60 mL/min (ref >60)
GLUCOSE: 112 mg/dL — AB (ref 65–99)
Potassium: 3 mmol/L — ABNORMAL LOW (ref 3.5–5.1)
SODIUM: 134 mmol/L — AB (ref 135–145)

## 2015-02-02 LAB — CBC
HEMATOCRIT: 25 % — AB (ref 40.0–52.0)
HEMATOCRIT: 25.7 % — AB (ref 40.0–52.0)
HEMOGLOBIN: 8.3 g/dL — AB (ref 13.0–18.0)
Hemoglobin: 8.3 g/dL — ABNORMAL LOW (ref 13.0–18.0)
MCH: 31 pg (ref 26.0–34.0)
MCH: 29.8 pg (ref 26.0–34.0)
MCHC: 33.2 g/dL (ref 32.0–36.0)
MCHC: 32.2 g/dL (ref 32.0–36.0)
MCV: 93.3 fL (ref 80.0–100.0)
MCV: 92.6 fL (ref 80.0–100.0)
PLATELETS: 485 10*3/uL — AB (ref 150–440)
Platelets: 532 10*3/uL — ABNORMAL HIGH (ref 150–440)
RBC: 2.68 MIL/uL — ABNORMAL LOW (ref 4.40–5.90)
RBC: 2.78 MIL/uL — AB (ref 4.40–5.90)
RDW: 14.8 % — AB (ref 11.5–14.5)
RDW: 14.9 % — ABNORMAL HIGH (ref 11.5–14.5)
WBC: 31 10*3/uL — AB (ref 3.8–10.6)
WBC: 26.5 10*3/uL — AB (ref 3.8–10.6)

## 2015-02-02 LAB — URINALYSIS COMPLETE WITH MICROSCOPIC (ARMC ONLY)
BILIRUBIN URINE: NEGATIVE
GLUCOSE, UA: NEGATIVE mg/dL
HGB URINE DIPSTICK: NEGATIVE
Ketones, ur: NEGATIVE mg/dL
LEUKOCYTES UA: NEGATIVE
Nitrite: NEGATIVE
PH: 5 (ref 5.0–8.0)
Protein, ur: NEGATIVE mg/dL
SQUAMOUS EPITHELIAL / LPF: NONE SEEN
Specific Gravity, Urine: 1.014 (ref 1.005–1.030)

## 2015-02-02 LAB — URINE CULTURE: Culture: NO GROWTH

## 2015-02-02 LAB — LACTIC ACID, PLASMA
Lactic Acid, Venous: 1.2 mmol/L (ref 0.5–2.0)
Lactic Acid, Venous: 0.8 mmol/L (ref 0.5–2.0)

## 2015-02-02 LAB — EXPECTORATED SPUTUM ASSESSMENT W GRAM STAIN, RFLX TO RESP C

## 2015-02-02 LAB — TROPONIN I
TROPONIN I: 0.05 ng/mL — AB (ref <0.031)
Troponin I: 0.04 ng/mL — ABNORMAL HIGH (ref <0.031)

## 2015-02-02 LAB — CORTISOL: CORTISOL PLASMA: 15.3 ug/dL

## 2015-02-02 LAB — TYPE AND SCREEN
ABO/RH(D): O NEG
ANTIBODY SCREEN: NEGATIVE

## 2015-02-02 LAB — EXPECTORATED SPUTUM ASSESSMENT W REFEX TO RESP CULTURE

## 2015-02-02 LAB — ABO/RH: ABO/RH(D): O NEG

## 2015-02-02 LAB — PROCALCITONIN: PROCALCITONIN: 0.58 ng/mL

## 2015-02-02 MED ORDER — NOREPINEPHRINE 4 MG/250ML-% IV SOLN
0.0000 ug/min | INTRAVENOUS | Status: DC
  Administered 2015-02-02: 4 ug/min via INTRAVENOUS
  Administered 2015-02-02: 3 ug/min via INTRAVENOUS
  Administered 2015-02-03: 2 ug/min via INTRAVENOUS
  Administered 2015-02-03: 0 ug/min via INTRAVENOUS
  Filled 2015-02-02: qty 250

## 2015-02-02 MED ORDER — AMIODARONE LOAD VIA INFUSION
150.0000 mg | Freq: Once | INTRAVENOUS | Status: AC
Start: 2015-02-02 — End: 2015-02-02
  Administered 2015-02-02: 150 mg via INTRAVENOUS
  Filled 2015-02-02: qty 83.34

## 2015-02-02 MED ORDER — SODIUM CHLORIDE 0.9 % IV BOLUS (SEPSIS)
1000.0000 mL | INTRAVENOUS | Status: AC
  Administered 2015-02-02: 1000 mL via INTRAVENOUS

## 2015-02-02 MED ORDER — ENSURE ENLIVE PO LIQD
237.0000 mL | Freq: Three times a day (TID) | ORAL | Status: DC
  Administered 2015-02-02 – 2015-02-10 (×22): 237 mL via ORAL

## 2015-02-02 MED ORDER — AMIODARONE HCL IN DEXTROSE 360-4.14 MG/200ML-% IV SOLN
60.0000 mg/h | INTRAVENOUS | Status: AC
  Administered 2015-02-02 (×2): 60 mg/h via INTRAVENOUS
  Filled 2015-02-02 (×2): qty 200

## 2015-02-02 MED ORDER — AMIODARONE HCL IN DEXTROSE 360-4.14 MG/200ML-% IV SOLN
30.0000 mg/h | INTRAVENOUS | Status: AC
  Administered 2015-02-02 – 2015-02-03 (×2): 30 mg/h via INTRAVENOUS
  Filled 2015-02-02 (×7): qty 200

## 2015-02-02 MED ORDER — POTASSIUM CHLORIDE CRYS ER 20 MEQ PO TBCR
40.0000 meq | EXTENDED_RELEASE_TABLET | Freq: Three times a day (TID) | ORAL | Status: AC
  Administered 2015-02-02 (×2): 40 meq via ORAL
  Filled 2015-02-02 (×2): qty 2

## 2015-02-02 MED ORDER — NOREPINEPHRINE BITARTRATE 1 MG/ML IV SOLN
0.0000 ug/min | INTRAVENOUS | Status: DC
  Filled 2015-02-02: qty 4

## 2015-02-02 MED ORDER — HYDROCORTISONE NA SUCCINATE PF 100 MG IJ SOLR
50.0000 mg | Freq: Three times a day (TID) | INTRAMUSCULAR | Status: DC
  Administered 2015-02-02 – 2015-02-04 (×6): 50 mg via INTRAVENOUS
  Filled 2015-02-02 (×6): qty 2

## 2015-02-02 NOTE — Progress Notes (Signed)
Levophed drip hung to support patient's blood pressure.

## 2015-02-02 NOTE — Progress Notes (Signed)
Per Vesta Mixer, RN from [facility] it is okay to use PICC line per XRAY verification.

## 2015-02-02 NOTE — Progress Notes (Signed)
[facility] INFECTIOUS DISEASE PROGRESS NOTE Date of Admission:  02/01/2015     ID: Jonathan [name] is a 59 y.o. male with cavitary upper lobe PNA, 50 # wt loss  Active Problems:   Sepsis   Subjective: Febrile to 100.8, wbc down to 26. Still with cough, hypoxia. Sputum cx and AFB pending.   ROS  Eleven systems are reviewed and negative except per hpi  Medications:  Antibiotics Given (last 72 hours)    Date/Time Action Medication Dose Rate   02/01/15 1607 Given   piperacillin-tazobactam (ZOSYN) IVPB 3.375 g 3.375 g 12.5 mL/hr   02/01/15 2029 Given   vancomycin (VANCOCIN) IVPB 750 mg/150 ml premix 750 mg 150 mL/hr   02/02/15 0006 Given   piperacillin-tazobactam (ZOSYN) IVPB 3.375 g 3.375 g 12.5 mL/hr   02/02/15 0908 Given   piperacillin-tazobactam (ZOSYN) IVPB 3.375 g 3.375 g 12.5 mL/hr   02/02/15 0919 Given   vancomycin (VANCOCIN) IVPB 750 mg/150 ml premix 750 mg 150 mL/hr     . antiseptic oral rinse  7 mL Mouth Rinse QID  . citalopram  20 mg Oral BH-q7a  . enoxaparin (LOVENOX) injection  40 mg Subcutaneous Q24H  . feeding supplement (ENSURE ENLIVE)  237 mL Oral TID  . folic acid  1 mg Oral Daily  . hydrocortisone sod succinate (SOLU-CORTEF) inj  50 mg Intravenous Q8H  . multivitamin with minerals  1 tablet Oral Daily  . nicotine  21 mg Transdermal Daily  . piperacillin-tazobactam (ZOSYN)  IV  3.375 g Intravenous 3 times per day  . primidone  200 mg Oral QHS  . sodium chloride  3 mL Intravenous Q12H  . thiamine  100 mg Oral Daily  . topiramate  25 mg Oral BID  . vancomycin  750 mg Intravenous Q12H    Objective: Vital signs in last 24 hours: Temp:  [98.4 F (36.9 C)-100 F (37.8 C)] 98.6 F (37 C) (09/21 1025) Pulse Rate:  [27-173] 111 (09/21 1305) Resp:  [14-39] 32 (09/21 1305) BP: (65-128)/(45-108) 87/59 mmHg (09/21 1305) SpO2:  [91 %-100 %] 100 % (09/21 1305) Constitutional: He is oriented to person, place, and time. Very thin, dishelved, slurred speech,  tremor HENT: Mouth/Throat: Oropharynx is clear and moist. Poor dentition No oropharyngeal exudate.  Cardiovascular: Normal rate, re gular rhythm and normal heart sounds. Exam reveals no gallop and no friction rub.  Pulmonary/Chest: poor air movement on L side. Abdominal: Soft. Bowel sounds are normal. He exhibits no distension. There is no tenderness.  Lymphadenopathy: He has no cervical adenopathy.  Neurological: He is alert and oriented to person, place, and time.  Skin: Skin is warm and dry. No rash noted. No erythema.  Psychiatric: He has a normal mood and affect. His behavior is normal.   Lab Results  Recent Labs  02/01/15 0734 02/02/15 0348 02/02/15 1221  WBC 30.3* 31.0* 26.5*  HGB 10.3* 8.3* 8.3*  HCT 30.3* 25.0* 25.7*  NA 135 134*  --   K 3.3* 3.0*  --   CL 99* 106  --   CO2 24 25  --   BUN 14 13  --   CREATININE 0.97 0.71  --     Microbiology: Results for orders placed or performed during the hospital encounter of 02/01/15  Blood culture (routine x 2)     Status: None (Preliminary result)   Collection Time: 02/01/15  7:41 AM  Result Value Ref Range Status   Specimen Description BLOOD LEFT HAND  Final  Special Requests   Final    BOTTLES DRAWN AEROBIC AND ANAEROBIC 2 CC AERO 1 CC ANAERO   Culture NO GROWTH 1 DAY  Final   Report Status PENDING  Incomplete  Culture, expectorated sputum-assessment     Status: None   Collection Time: 02/01/15  7:42 AM  Result Value Ref Range Status   Specimen Description EXPECTORATED SPUTUM  Final   Special Requests Immunocompromised  Final   Sputum evaluation THIS SPECIMEN IS ACCEPTABLE FOR SPUTUM CULTURE  Final   Report Status 02/01/2015 FINAL  Final  Culture, respiratory (NON-Expectorated)     Status: None (Preliminary result)   Collection Time: 02/01/15  7:42 AM  Result Value Ref Range Status   Specimen Description EXPECTORATED SPUTUM  Final   Special Requests Immunocompromised Reflexed from [id number]  Final   Gram Stain  PENDING  Incomplete   Culture HOLDING FOR POSSIBLE PATHOGEN  Final   Report Status PENDING  Incomplete  Blood culture (routine x 2)     Status: None (Preliminary result)   Collection Time: 02/01/15  7:43 AM  Result Value Ref Range Status   Specimen Description BLOOD RIGHT ARM  Final   Special Requests   Final    BOTTLES DRAWN AEROBIC AND ANAEROBIC 1 CC ANAERO 4 CC AERO   Culture NO GROWTH 1 DAY  Final   Report Status PENDING  Incomplete  Urine culture     Status: None   Collection Time: 02/01/15  8:35 AM  Result Value Ref Range Status   Specimen Description URINE, CLEAN CATCH  Final   Special Requests NONE  Final   Culture NO GROWTH 1 DAY  Final   Report Status 02/02/2015 FINAL  Final  MRSA PCR Screening     Status: None   Collection Time: 02/01/15 12:50 PM  Result Value Ref Range Status   MRSA by PCR NEGATIVE NEGATIVE Final    Comment:        The GeneXpert MRSA Assay (FDA approved for NASAL specimens only), is one component of a comprehensive MRSA colonization surveillance program. It is not intended to diagnose MRSA infection nor to guide or monitor treatment for MRSA infections.     Studies/Results: Dg Chest Port 1 View  02/02/2015   CLINICAL DATA:  Pneumonia. Shortness of breath. Weight loss. Nights with  EXAM: PORTABLE CHEST - 1 VIEW  COMPARISON:  02/01/2015  FINDINGS: Dense consolidation again noted in the upper lobe, unchanged. Questionable central cavitation. No confluent opacity on the right. Heart is normal size. No visible effusions.  IMPRESSION: Stable dense consolidation in the left upper lobe with questionable areas of central cavitation.   Electronically Signed   By: Rolm Baptise M.D.   On: 02/02/2015 11:07   Dg Chest Portable 1 View  02/01/2015   CLINICAL DATA:  Productive cough, shortness of breath, assess for TB  EXAM: PORTABLE CHEST - 1 VIEW  COMPARISON:  None.  FINDINGS: There is consolidation of the left upper lobe with cavitation. The right lung is clear.  There is no pleural effusion bilaterally. The mediastinal contour and cardiac silhouette are normal. The bony structures are normal.  IMPRESSION: Consolidation of the left upper lobe with cavitation. The findings are likely infectious in nature, TB is not excluded based on the x-ray.   Electronically Signed   By: Abelardo Diesel M.D.   On: 02/01/2015 09:15    Assessment/Plan: Jonathan [name] is a 59 y.o. male with seizure disorder, essential tremor, chronic heavy tobacco and etoh  use now with 50# wt los in 2 years, chronic cough with sputum and hemoptysis. No history of TB contacts, prison, [employer]. Denies ever having TB test done. WBC is 30K. UA also dirty. CXR with LUL infiltrate, I suspect lung cancer but TB is still possible. Could also have necrotizing PNA.  HIV neg, sputum cx and afb pending  Recommendations Check Quantiferon gold. Await sputum and AFB x 3.  Continue vanco zosyn pending sputum results Will need CT chest to further evaluate for mass  Thank you very much for the consult. Will follow with you.  [place], [place]   02/02/2015, 1:15 PM

## 2015-02-02 NOTE — Progress Notes (Signed)
Initial Nutrition Assessment      INTERVENTION:  Meals and snacks: Cater to pt preferences Medical Nutrition Supplement Therapy: Recommend adding Ensure Enlive po TID, each supplement provides 350 kcal and 20 grams of protein    NUTRITION DIAGNOSIS:   Inadequate oral intake related to acute illness as evidenced by percent weight loss.    GOAL:   Patient will meet greater than or equal to 90% of their needs    MONITOR:    (Energy intake, Anthropometric, electrolyte and renal profile)  REASON FOR ASSESSMENT:   Other (Comment) (BMI of 15)    ASSESSMENT:      Pt admitted with acute respiratory failure, ruling out TB, septic shock, MI, afib RVR, elevated troponin  Past Medical History  Diagnosis Date  . Seizures     Current Nutrition: Noted per chart ate 80% of dinner last night and 100% of breakfast this am. Unable to speak with pt this am secondary to change in status  Food/Nutrition-Related History: unsure intake prior to admission   Medications: NS at 132ml/hr, starting levophed, senokot S, KCL, thiamine, MVI, folic acid  Electrolyte/Renal Profile and Glucose Profile:   Recent Labs Lab 02/01/15 0734 02/01/15 1319 02/02/15 0348  NA 135  --  134*  K 3.3*  --  3.0*  CL 99*  --  106  CO2 24  --  25  BUN 14  --  13  CREATININE 0.97  --  0.71  CALCIUM 8.3*  --  7.5*  MG  --  1.9  --   PHOS  --  3.4  --   GLUCOSE 133*  --  112*   Protein Profile:  Recent Labs Lab 02/01/15 0734  ALBUMIN 2.1*    Gastrointestinal Profile: Last BM:9/21   Nutrition-Focused Physical Exam Findings:  Unable to complete Nutrition-Focused physical exam at this time.     Weight Change: noted per MD note wt loss of 50 pounds in the last 2 year (32% weight loss in 2 years)    Diet Order:  Diet regular Room service appropriate?: Yes; Fluid consistency:: Thin  Skin:   reviewed   Height:   Ht Readings from Last 1 Encounters:  02/01/15 5\' 9"  (1.753 m)     Weight:   Wt Readings from Last 1 Encounters:  02/01/15 103 lb 9.9 oz (47 kg)     BMI:  Body mass index is 15.29 kg/(m^2).  Estimated Nutritional Needs:   Kcal:  BEE 1545 kcals (IF 1.1-1.3, AF 1.3) 2209-2611 kcals/d. Using IBW of 73kg  Protein:  (1.1-1.3 g/kg) 80-95 g/d  Fluid:  (30-63ml/kg) 2130-8657 ml/d  EDUCATION NEEDS:   No education needs identified at this time  Norris. Zenia Resides, [place], [place] (pager)

## 2015-02-02 NOTE — Progress Notes (Signed)
Pharmacy Consult for Electrolyte Managemen  No Known Allergies  Patient Measurements: Height: 5\' 9"  (175.3 cm) Weight: 103 lb 9.9 oz (47 kg) IBW/kg (Calculated) : 70.7  Vital Signs: Temp: 98.8 F (37.1 C) (09/21 1330) Temp Source: Oral (09/21 1330) BP: 83/56 mmHg (09/21 1330) Pulse Rate: 101 (09/21 1330) Intake/Output from previous day: 09/20 0701 - 09/21 0700 In: 387 [P.O.:237; I.V.:150] Out: 2 [Urine:1; Stool:1] Intake/Output from this shift: Total I/O In: 2423.9 [P.O.:240; I.V.:983.9; IV Piggyback:1200] Out: -   Labs:  Recent Labs  02/01/15 0734 02/02/15 0348 02/02/15 1221  WBC 30.3* 31.0* 26.5*  HGB 10.3* 8.3* 8.3*  HCT 30.3* 25.0* 25.7*  PLT 613* 485* 532*  APTT 59*  --  50*  INR 1.41  --  1.26     Recent Labs  02/01/15 0734 02/01/15 1319 02/02/15 0348 02/02/15 1221  NA 135  --  134* 133*  K 3.3*  --  3.0* 2.8*  CL 99*  --  106 105  CO2 24  --  25 21*  GLUCOSE 133*  --  112* 127*  BUN 14  --  13 11  CREATININE 0.97  --  0.71 0.69  CALCIUM 8.3*  --  7.5* 7.1*  MG  --  1.9  --   --   PHOS  --  3.4  --   --   PROT 6.6  --   --  4.7*  ALBUMIN 2.1*  --   --  1.5*  AST 26  --   --  49*  ALT 13*  --   --  20  ALKPHOS 105  --   --  80  BILITOT 0.5  --   --  0.4   Estimated Creatinine Clearance: 67.7 mL/min (by C-G formula based on Cr of 0.69).    Recent Labs  02/01/15 1214  GLUCAP 99    Medical History: Past Medical History  Diagnosis Date  . Seizures     Medications:  Scheduled:  . antiseptic oral rinse  7 mL Mouth Rinse QID  . citalopram  20 mg Oral BH-q7a  . enoxaparin (LOVENOX) injection  40 mg Subcutaneous Q24H  . feeding supplement (ENSURE ENLIVE)  237 mL Oral TID  . folic acid  1 mg Oral Daily  . hydrocortisone sod succinate (SOLU-CORTEF) inj  50 mg Intravenous Q8H  . multivitamin with minerals  1 tablet Oral Daily  . nicotine  21 mg Transdermal Daily  . piperacillin-tazobactam (ZOSYN)  IV  3.375 g Intravenous 3 times per day   . potassium chloride  40 mEq Oral TID  . primidone  200 mg Oral QHS  . sodium chloride  3 mL Intravenous Q12H  . thiamine  100 mg Oral Daily  . topiramate  25 mg Oral BID  . vancomycin  750 mg Intravenous Q12H   Infusions:  . sodium chloride 150 mL/hr at 02/02/15 1221  . amiodarone 60 mg/hr (02/02/15 1319)   Followed by  . amiodarone    . norepinephrine 4 mcg/min (02/02/15 1213)   PRN: acetaminophen **OR** acetaminophen, HYDROcodone-acetaminophen, LORazepam, senna-docusate  Assessment: Pharmacy consulted to assist in managing electrolytes in this 59 y/o M with acute respiratory failure due to LUL cavitary PNA.   Plan:  Potassium is low so will replace with 40 meq po x 2 and f/u am labs.   Ulice Dash D 02/02/2015,2:15 PM

## 2015-02-02 NOTE — Progress Notes (Signed)
Patient converted to Sinus tach.

## 2015-02-02 NOTE — Progress Notes (Signed)
Blood pressure rechecked and is 80/55, patient sleepy but arousable to voice and oriented. Heart rate remains elevated in afib. Dr. Benjie Karvonen paged. Margreta Journey, RN in room managing patient at this time.

## 2015-02-02 NOTE — Progress Notes (Signed)
Erasmo Downer, tele clerk made this RN aware that patients heart rate elevated to 160 which looks like some artifact. Patient having tremors. This RN asked Margreta Journey, RN charge nurse to check on patient as this RN is tied up with a different critical patient who was just intubated.

## 2015-02-02 NOTE — Progress Notes (Signed)
Dr. Benjie Karvonen has been paged twice and has not called ICU back.  RN informed Dr. Ashby Dawes that patient's blood pressure is low systolic running 88'T-25'Q and that heart rate is elevated 160's and in afib and that patient was previously in sinus tach.  Respirations 30/min with o2 sats 90's on 2L nasal cannula. MD acknowledged all mentioned and gave order for normal saline 1 liter bolus, chest xray and amio bolus with drip to be started.

## 2015-02-02 NOTE — Evaluation (Signed)
The critical care RN informed us during multidisciplinary rounds that the patient appeared to have atrial fibrillation with rapid ventricular rate with a heart rate of approximately 160. Patient was also noted to be hypotensive with a systolic blood pressure of 85. The patient's primary team was not available at this time, critical care team is not currently consulted.   Recommended the patient be started on IV amiodarone bolus followed by an IV amiodarone infusion to better control heart rate. Also recommended the patient be given a 1 L bolus of normal saline to stabilize blood pressure.  -Marda Stalker, M.D.

## 2015-02-02 NOTE — Progress Notes (Signed)
Patient in Afib rate 170's and blood pressure low.  This RN asked Levada Dy, RN and Margreta Journey, RN to check on patient because this RN is still busy stabilizing another patient's blood pressure.

## 2015-02-02 NOTE — Progress Notes (Signed)
Blood pressure stable on levophed drip. NSR per cardiac monitor on amio drip. Denies pain. A&O. o2 sats upper 90's on 2L.

## 2015-02-02 NOTE — Consult Note (Signed)
ANTIBIOTIC CONSULT NOTE - INITIAL  Pharmacy Consult for vancomycin/zosyn Indication: rule out pneumonia and rule out sepsis  No Known Allergies  Patient Measurements: Height: 5\' 9"  (175.3 cm) Weight: 103 lb 9.9 oz (47 kg) IBW/kg (Calculated) : 70.7 Adjusted Body Weight:   Vital Signs: Temp: 98.4 F (36.9 C) (09/20 2100) Temp Source: Oral (09/20 2100) BP: 100/58 mmHg (09/21 0700) Pulse Rate: 113 (09/21 0700) Intake/Output from previous day: 09/20 0701 - 09/21 0700 In: 387 [P.O.:237; I.V.:150] Out: 2 [Urine:1; Stool:1] Intake/Output from this shift:    Labs:  Recent Labs  02/01/15 0734 02/02/15 0348  WBC 30.3* 31.0*  HGB 10.3* 8.3*  PLT 613* 485*  CREATININE 0.97 0.71   Estimated Creatinine Clearance: 67.7 mL/min (by C-G formula based on Cr of 0.71). No results for input(s): VANCOTROUGH, VANCOPEAK, VANCORANDOM, GENTTROUGH, GENTPEAK, GENTRANDOM, TOBRATROUGH, TOBRAPEAK, TOBRARND, AMIKACINPEAK, AMIKACINTROU, AMIKACIN in the last 72 hours.   Microbiology: Recent Results (from the past 720 hour(s))  Culture, expectorated sputum-assessment     Status: None   Collection Time: 02/01/15  7:42 AM  Result Value Ref Range Status   Specimen Description EXPECTORATED SPUTUM  Final   Special Requests Immunocompromised  Final   Sputum evaluation THIS SPECIMEN IS ACCEPTABLE FOR SPUTUM CULTURE  Final   Report Status 02/01/2015 FINAL  Final  MRSA PCR Screening     Status: None   Collection Time: 02/01/15 12:50 PM  Result Value Ref Range Status   MRSA by PCR NEGATIVE NEGATIVE Final    Comment:        The GeneXpert MRSA Assay (FDA approved for NASAL specimens only), is one component of a comprehensive MRSA colonization surveillance program. It is not intended to diagnose MRSA infection nor to guide or monitor treatment for MRSA infections.     Medical History: Past Medical History  Diagnosis Date  . Seizures     Medications:  Scheduled:  . antiseptic oral rinse  7 mL  Mouth Rinse QID  . citalopram  20 mg Oral BH-q7a  . enoxaparin (LOVENOX) injection  40 mg Subcutaneous Q24H  . folic acid  1 mg Oral Daily  . multivitamin with minerals  1 tablet Oral Daily  . nicotine  21 mg Transdermal Daily  . piperacillin-tazobactam (ZOSYN)  IV  3.375 g Intravenous 3 times per day  . primidone  200 mg Oral QHS  . sodium chloride  3 mL Intravenous Q12H  . thiamine  100 mg Oral Daily  . topiramate  25 mg Oral BID  . vancomycin  750 mg Intravenous Q12H   Assessment: Pt is a 59 year old male who presents with SOB, cough, x-ray showing left lobe consolidation with possible pneumonia or tuberculosis. Pt also reports blood-tinged sputum  Goal of Therapy:  Vancomycin trough level 15-20 mcg/ml  Plan:  Will continue current therapy with vancomycin and Zosyn and check vancomycin trough at steady state. Will f/u renal funciton and culture results.  Ulice Dash D 02/02/2015,8:15 AM

## 2015-02-02 NOTE — Progress Notes (Signed)
Pt sitting in bed eating breakfast. Pt noted to have tremors that are shaking food off. Pt states MD told him that tremors were not due to alcohol but that they are inherited. Pt denies having tremors this bad normally. Pt heart rate noted to be in the 160's to 170's while eating breakfast. Pt was given a dose of Ativan to see if this would help relieve the increased HR and the tremors. Anh Bigos E 12:16 PM. 02/02/2015

## 2015-02-02 NOTE — Progress Notes (Signed)
[facility] [place] NAME: Jonathan [name]    MR#:  [id number]  DATE OF BIRTH:  1955/06/04  SUBJECTIVE:  Patient with elevation in heart rate and hypotension. Patient is asymptomatic.  REVIEW OF SYSTEMS:    Review of Systems  Constitutional: Positive for weight loss. Negative for fever, chills and malaise/fatigue.  HENT: Negative for sore throat.   Eyes: Negative for blurred vision.  Respiratory: Positive for cough. Negative for hemoptysis, shortness of breath and wheezing.   Cardiovascular: Negative for chest pain, palpitations and leg swelling.  Gastrointestinal: Negative for nausea, vomiting, abdominal pain, diarrhea and blood in stool.  Genitourinary: Negative for dysuria.  Musculoskeletal: Negative for back pain.  Neurological: Positive for weakness. Negative for dizziness, tremors and headaches.  Endo/Heme/Allergies: Does not bruise/bleed easily.  Psychiatric/Behavioral: Negative for depression.    Tolerating Diet: Yes      DRUG ALLERGIES:  No Known Allergies  VITALS:  Blood pressure 101/60, pulse 118, temperature 98.6 F (37 C), temperature source Axillary, resp. rate 36, height 5\' 9"  (1.753 m), weight 47 kg (103 lb 9.9 oz), SpO2 100 %.  PHYSICAL EXAMINATION:   Physical Exam  Constitutional: He is oriented to person, place, and time and well-developed, well-nourished, and in no distress. No distress.  Frail weak  HENT:  Head: Normocephalic.  Eyes: No scleral icterus.  Neck: Normal range of motion. Neck supple. No JVD present. No tracheal deviation present.  Cardiovascular: Normal heart sounds.  Exam reveals no gallop and no friction rub.   No murmur heard. Tachycardic, irregular, irregular  Pulmonary/Chest: Effort normal. No respiratory distress. He has wheezes. He has no rales. He exhibits no tenderness.  Abdominal: Soft. Bowel sounds are normal. He exhibits no distension and no mass. There is no tenderness.  There is no rebound and no guarding.  Musculoskeletal: Normal range of motion. He exhibits no edema.  Neurological: He is alert and oriented to person, place, and time.  Skin: Skin is warm. No rash noted. He is not diaphoretic. No erythema.  Psychiatric: Affect and judgment normal.      LABORATORY PANEL:   CBC  Recent Labs Lab 02/02/15 0348  WBC 31.0*  HGB 8.3*  HCT 25.0*  PLT 485*   ------------------------------------------------------------------------------------------------------------------  Chemistries   Recent Labs Lab 02/01/15 0734 02/01/15 1319 02/02/15 0348  NA 135  --  134*  K 3.3*  --  3.0*  CL 99*  --  106  CO2 24  --  25  GLUCOSE 133*  --  112*  BUN 14  --  13  CREATININE 0.97  --  0.71  CALCIUM 8.3*  --  7.5*  MG  --  1.9  --   AST 26  --   --   ALT 13*  --   --   ALKPHOS 105  --   --   BILITOT 0.5  --   --    ------------------------------------------------------------------------------------------------------------------  Cardiac Enzymes  Recent Labs Lab 02/01/15 0734 02/01/15 1319 02/01/15 2355  TROPONINI 0.09* 2.59* 0.05*   ------------------------------------------------------------------------------------------------------------------  RADIOLOGY:  Dg Chest Port 1 View  02/02/2015   CLINICAL DATA:  Pneumonia. Shortness of breath. Weight loss. Nights with  EXAM: PORTABLE CHEST - 1 VIEW  COMPARISON:  02/01/2015  FINDINGS: Dense consolidation again noted in the upper lobe, unchanged. Questionable central cavitation. No confluent opacity on the right. Heart is normal size. No visible effusions.  IMPRESSION: Stable dense consolidation in the left upper lobe with  questionable areas of central cavitation.   Electronically Signed   By: Rolm Baptise M.D.   On: 02/02/2015 11:07   Dg Chest Portable 1 View  02/01/2015   CLINICAL DATA:  Productive cough, shortness of breath, assess for TB  EXAM: PORTABLE CHEST - 1 VIEW  COMPARISON:  None.   FINDINGS: There is consolidation of the left upper lobe with cavitation. The right lung is clear. There is no pleural effusion bilaterally. The mediastinal contour and cardiac silhouette are normal. The bony structures are normal.  IMPRESSION: Consolidation of the left upper lobe with cavitation. The findings are likely infectious in nature, TB is not excluded based on the x-ray.   Electronically Signed   By: Abelardo Diesel M.D.   On: 02/01/2015 09:15     ASSESSMENT AND PLAN:   59 year old male who presents with acute respiratory failure secondary to left upper lobe cavitation with septic shock.  1. Acute hypoxic respiratory failure: This is secondary to left upper lobe cavitation: We are currently ruling out tuberculosis. AFBs are pending. Patient is on broad-spectrum antibiotics including Zosyn and vancomycin. Blood cultures are negative to date. Patient is being followed by infectious disease and pulmonary. Patient will eventually need a chest CT to evaluate for possible underlying lung mass.  2. Septic shock: This is secondary to left upper lobe cavitation. Since blood pressure is low and is now started on Levophed.  3. Atrial fibrillation RVR: This is likely secondary to problem #1 and 2. Patient is on amiodarone drip after bolus. Cardiology is following patient consultation. Patient underwent 2-D echocardiogram which showed normal ejection fraction and no major valvular abnormalities and/or endocarditis. Continue to follow heart rate on amiodarone.  4. Elevated troponin: It is reported that the second troponin is 2.59 and then subsequently 0.05 I believes that there is this probably an error and will call lab.   5. Hypokalemia: This will be repleted and rechecked in a.m. Magnesium level was normal. 6. Leukocytosis: Likely secondary to pneumonia and cavitary lesion. Repeat CBC in a.m.   Management plans discussed with the patient and he is in agreement.  patient is critically ill and high  risk of cardiopulmonary failure    CODE STATUS: Full    critical care TOTAL TIME TAKING CARE OF THIS PATIENT: 35 minutes.     POSSIBLE D/C ??, DEPENDING ON CLINICAL CONDITION.   Trayquan Kolakowski M.D on 02/02/2015 at 12:42 PM  Between 7am to 6pm - Pager - [phone] After 6pm go to [email or web address] - password [email or web address] [facility]  [facility]  Office  [phone]  CC: Primary care physician; No primary care provider on file.

## 2015-02-02 NOTE — Consult Note (Addendum)
[facility] Consultation     ASSESSMENT/PLAN   59 year old male with acute respiratory failure likely secondary to Left-sided pneumonia. Compensated by septic shock with type II, myocardial infarction, and atrial fibrillation with rapid ventricular rate.  PULMONARY  A: Left-sided pneumonia with acute respiratory failure. P:   -Continue daily sputum and daily 3 days, to rule out TB. -May need to consider bronchoscopy if The patient is not symptomatically improving.  CARDIOVASCULAR  A: Type II, myocardial infarction, likely stress-induced ischemia. Atrial fibrillation with RVR. Hypotension due to septic shock and atrial fibrillation. P:  Heart disease services, following, continue IV amiodarone. Continue IV fluids. May need to start the patient on levophed.  RENAL A:  Kidney function appears to be stable at this time. P:   Continue fluid resuscitation for septic shock.  GASTROINTESTINAL Continue by mouth diet.  HEMATOLOGIC A:  Leukocytosis, with left shift, likely due to pneumonia with sepsis P:  Continue to monitor.  INFECTIOUS A:  Pneumonia with sepsis. Infectious disease services, following. -Rule out TB. P:    Sputum 02/01/2015; sputum culture 2, and sputum for AFB are all pending at this time. We'll continue to check sputum AFB daily 3 total. Abx: Vancomycin, Zosyn, will continue per infectious disease recommendations.  ENDOCRINE --  NEUROLOGIC --   ---------------------------------------  ---------------------------------------   Name: Jonathan [name] MRN: [id number] DOB: 05-25-1955    ADMISSION DATE:  02/01/2015 CONSULTATION DATE:  02/02/2015.  REFERRING MD :  Dr. Benjie Karvonen  CHIEF COMPLAINT:  Dyspnea.    HISTORY OF PRESENT ILLNESS:    The patient is currently slightly lethargic and is tangential in his speech, he can not provide a detailed history therefore history was obtained from chart and staff.   Per ED: Jonathan [name] is a 59 y.o. male with history of essential tremor, history of seizure disorder, history of alcohol abuse who presents for evaluation of feeling unsafe at home. When asked what exactly he means he reports "it's a long story. nobody in the community wants to do anything for my family. There are people who think I have AIDS because I have lost so much weight". He denies any audiovisual hallucinations, SI or HI. He is unable to tell me exactly why he called the ambulance this morning however on EMS arrival he was coughing, appeared short of breath and tachypnea and was hypoxic requiring supplemental oxygen. He reports he has been coughing for 2-3 weeks and has been feeling "hot and cold". He reports that his sputum is blood-tinged. He denies any vomiting or diarrhea. He denies any chest pain or difficulty breathing. His last alcoholic drink was "a few days ago". Current severity of symptoms is moderate. No modifying factors.   PAST MEDICAL HISTORY :  Past Medical History  Diagnosis Date  . Seizures    History reviewed. No pertinent past surgical history. Prior to Admission medications   Medication Sig Start Date End Date Taking? Authorizing Provider  citalopram (CELEXA) 20 MG tablet Take 20 mg by mouth every morning.   Yes Historical Provider, MD  primidone (MYSOLINE) 50 MG tablet Take 200 mg by mouth at bedtime.   Yes Historical Provider, MD  topiramate (TOPAMAX) 25 MG tablet Take 25 mg by mouth 2 (two) times daily. 1 tablet in the morning and the second tablet 6 hours after the first   Yes Historical Provider, MD   No Known Allergies  FAMILY HISTORY:  History reviewed. No pertinent family history. SOCIAL HISTORY:  reports  that he has been smoking.  He does not have any smokeless tobacco history on file. His alcohol and drug histories are not on file.  REVIEW OF SYSTEMS:   Constitutional: Feels well. Cardiovascular: No chest pain.  Pulmonary: Denies dyspnea.   The remainder of  systems were reviewed and were found to be negative other than what is documented in the HPI.    VITAL SIGNS: Temp:  [97.1 F (36.2 C)-100.8 F (38.2 C)] 98.6 F (37 C) (09/21 1025) Pulse Rate:  [92-173] 150 (09/21 1125) Resp:  [14-39] 32 (09/21 1130) BP: (65-128)/(45-108) 71/52 mmHg (09/21 1130) SpO2:  [94 %-100 %] 100 % (09/21 1120) Weight:  [47 kg (103 lb 9.9 oz)] 47 kg (103 lb 9.9 oz) (09/20 1220) HEMODYNAMICS:   VENTILATOR SETTINGS:   INTAKE / OUTPUT:  Intake/Output Summary (Last 24 hours) at 02/02/15 1134 Last data filed at 02/02/15 1100  Gross per 24 hour  Intake 2174.3 ml  Output      2 ml  Net 2172.3 ml    Physical Examination:   VS: BP 71/52 mmHg  Pulse 150  Temp(Src) 98.6 F (37 C) (Axillary)  Resp 32  Ht 5\' 9"  (1.753 m)  Wt 47 kg (103 lb 9.9 oz)  BMI 15.29 kg/m2  SpO2 100%  General Appearance: No distress  Neuro:without focal findings, mental status, speech normal, alert and oriented, cranial nerves 2-12 intact, reflexes normal and symmetric, sensation grossly normal  HEENT: PERRLA, EOM intact, no ptosis, no other lesions noticed;  Pulmonary: normal breath sounds., diaphragmatic excursion normal.No wheezing, No rales;     CardiovascularNormal S1,S2.  No m/r/g.    Abdomen: Benign, Soft, non-tender, No masses, hepatosplenomegaly, No lymphadenopathy Renal:  No costovertebral tenderness  GU:  Not performed at this time. Endoc: No evident thyromegaly, no signs of acromegaly. Skin:   warm, no rashes, no ecchymosis  Extremities: normal, no cyanosis, clubbing, no edema, warm with normal capillary refill.    LABS: Reviewed   LABORATORY PANEL:   CBC  Recent Labs Lab 02/02/15 0348  WBC 31.0*  HGB 8.3*  HCT 25.0*  PLT 485*    Chemistries   Recent Labs Lab 02/01/15 0734 02/01/15 1319 02/02/15 0348  NA 135  --  134*  K 3.3*  --  3.0*  CL 99*  --  106  CO2 24  --  25  GLUCOSE 133*  --  112*  BUN 14  --  13  CREATININE 0.97  --  0.71    CALCIUM 8.3*  --  7.5*  MG  --  1.9  --   PHOS  --  3.4  --   AST 26  --   --   ALT 13*  --   --   ALKPHOS 105  --   --   BILITOT 0.5  --   --      Recent Labs Lab 02/01/15 1214  GLUCAP 99   No results for input(s): PHART, PCO2ART, PO2ART in the last 168 hours.  Recent Labs Lab 02/01/15 0734  AST 26  ALT 13*  ALKPHOS 105  BILITOT 0.5  ALBUMIN 2.1*    Cardiac Enzymes  Recent Labs Lab 02/01/15 2355  TROPONINI 0.05*    RADIOLOGY:  Dg Chest Port 1 View  02/02/2015   CLINICAL DATA:  Pneumonia. Shortness of breath. Weight loss. Nights with  EXAM: PORTABLE CHEST - 1 VIEW  COMPARISON:  02/01/2015  FINDINGS: Dense consolidation again noted in the upper lobe, unchanged. Questionable central  cavitation. No confluent opacity on the right. Heart is normal size. No visible effusions.  IMPRESSION: Stable dense consolidation in the left upper lobe with questionable areas of central cavitation.   Electronically Signed   By: Rolm Baptise M.D.   On: 02/02/2015 11:07   Dg Chest Portable 1 View  02/01/2015   CLINICAL DATA:  Productive cough, shortness of breath, assess for TB  EXAM: PORTABLE CHEST - 1 VIEW  COMPARISON:  None.  FINDINGS: There is consolidation of the left upper lobe with cavitation. The right lung is clear. There is no pleural effusion bilaterally. The mediastinal contour and cardiac silhouette are normal. The bony structures are normal.  IMPRESSION: Consolidation of the left upper lobe with cavitation. The findings are likely infectious in nature, TB is not excluded based on the x-ray.   Electronically Signed   By: Abelardo Diesel M.D.   On: 02/01/2015 09:15       --Marda Stalker, MD.  Board Certified in Internal Medicine, Pulmonary Medicine, [place], and Sleep Medicine.  [facility]  Patricia Pesa, M.D.  Vilinda Boehringer, M.D.  Merton Border, M.D   02/02/2015, 11:34 AM  [place].  I have personally obtained a  history, examined the patient, evaluated laboratory and imaging results, formulated the assessment and plan and placed orders. The Patient requires high complexity decision making for assessment and support, frequent evaluation and titration of therapies, application of advanced monitoring technologies and extensive interpretation of multiple databases. The patient has critical illness that could lead imminently to failure of 1 or more organ systems and requires the highest level of physician preparedness to intervene.  Critical Care Time devoted to patient care services described in this note is 35 minutes and is exclusive of time spent in procedures.

## 2015-02-02 NOTE — Progress Notes (Signed)
RN made Dr. Ashby Dawes aware that k+2.8 and troponin 0.04, MD acknowledged and gave no verbal orders.

## 2015-02-02 NOTE — Progress Notes (Signed)
RN informed Dr. Ashby Dawes that fluid bolus is complete and that blood pressure remains low systoliclly in 51-89'Q with MAP 50-60. MD gave order for levophed drip.  RN asked MD if levophed drip could be started before PICC line was placed and use peripheral IV until then. MD okay with RN using peripheral IV's to infuse levophed drip until PICC is placed.

## 2015-02-03 ENCOUNTER — Inpatient Hospital Stay: Payer: Medicaid Other

## 2015-02-03 ENCOUNTER — Encounter: Payer: Self-pay | Admitting: Radiology

## 2015-02-03 DIAGNOSIS — R6521 Severe sepsis with septic shock: Secondary | ICD-10-CM

## 2015-02-03 LAB — C DIFFICILE QUICK SCREEN W PCR REFLEX
C DIFFICILE (CDIFF) INTERP: NEGATIVE
C Diff antigen: NEGATIVE
C Diff toxin: NEGATIVE

## 2015-02-03 LAB — QUANTIFERON IN TUBE
QFT TB AG MINUS NIL VALUE: <0 IU/mL
QUANTIFERON MITOGEN VALUE: 0.02 [IU]/mL
QUANTIFERON NIL VALUE: 0.03 [IU]/mL
QUANTIFERON TB AG VALUE: 0.02 [IU]/mL
QUANTIFERON TB GOLD: UNDETERMINED

## 2015-02-03 LAB — COMPREHENSIVE METABOLIC PANEL
ALBUMIN: 1.5 g/dL — AB (ref 3.5–5.0)
ALK PHOS: 80 U/L (ref 38–126)
ALT: 20 U/L (ref 17–63)
AST: 49 U/L — AB (ref 15–41)
Anion gap: 7 (ref 5–15)
BILIRUBIN TOTAL: 0.4 mg/dL (ref 0.3–1.2)
BUN: 11 mg/dL (ref 6–20)
CALCIUM: 7.1 mg/dL — AB (ref 8.9–10.3)
CO2: 21 mmol/L — AB (ref 22–32)
Chloride: 105 mmol/L (ref 101–111)
Creatinine, Ser: 0.69 mg/dL (ref 0.61–1.24)
GFR calc Af Amer: >60 mL/min (ref >60)
GFR calc non Af Amer: >60 mL/min (ref >60)
GLUCOSE: 127 mg/dL — AB (ref 65–99)
Potassium: 2.8 mmol/L — CL (ref 3.5–5.1)
SODIUM: 133 mmol/L — AB (ref 135–145)
TOTAL PROTEIN: 4.7 g/dL — AB (ref 6.5–8.1)

## 2015-02-03 LAB — VANCOMYCIN, TROUGH: VANCOMYCIN TR: 11 ug/mL (ref 10–20)

## 2015-02-03 LAB — BASIC METABOLIC PANEL
ANION GAP: 3 — AB (ref 5–15)
BUN: 11 mg/dL (ref 6–20)
CHLORIDE: 111 mmol/L (ref 101–111)
CO2: 24 mmol/L (ref 22–32)
Calcium: 7.4 mg/dL — ABNORMAL LOW (ref 8.9–10.3)
Creatinine, Ser: 0.67 mg/dL (ref 0.61–1.24)
GFR calc Af Amer: >60 mL/min (ref >60)
Glucose, Bld: 130 mg/dL — ABNORMAL HIGH (ref 65–99)
POTASSIUM: 3.2 mmol/L — AB (ref 3.5–5.1)
SODIUM: 138 mmol/L (ref 135–145)

## 2015-02-03 LAB — QUANTIFERON TB GOLD ASSAY (BLOOD)

## 2015-02-03 LAB — MAGNESIUM: MAGNESIUM: 2 mg/dL (ref 1.7–2.4)

## 2015-02-03 LAB — PHOSPHORUS: PHOSPHORUS: 2.5 mg/dL (ref 2.5–4.6)

## 2015-02-03 MED ORDER — IOHEXOL 300 MG/ML  SOLN
75.0000 mL | Freq: Once | INTRAMUSCULAR | Status: AC | PRN
  Administered 2015-02-03: 75 mL via INTRAVENOUS

## 2015-02-03 MED ORDER — SODIUM CHLORIDE 0.9 % IV SOLN
4.0000 mg/kg | Freq: Two times a day (BID) | INTRAVENOUS | Status: DC
  Administered 2015-02-04 – 2015-02-08 (×9): 190 mg via INTRAVENOUS
  Filled 2015-02-03 (×12): qty 190

## 2015-02-03 MED ORDER — POTASSIUM CHLORIDE 10 MEQ/100ML IV SOLN
10.0000 meq | INTRAVENOUS | Status: AC
  Administered 2015-02-03 (×4): 10 meq via INTRAVENOUS
  Filled 2015-02-03 (×4): qty 100

## 2015-02-03 MED ORDER — VANCOMYCIN HCL IN DEXTROSE 750-5 MG/150ML-% IV SOLN
750.0000 mg | Freq: Three times a day (TID) | INTRAVENOUS | Status: DC
  Administered 2015-02-04 – 2015-02-07 (×11): 750 mg via INTRAVENOUS
  Filled 2015-02-03 (×14): qty 150

## 2015-02-03 MED ORDER — AMIODARONE HCL 200 MG PO TABS
200.0000 mg | ORAL_TABLET | Freq: Two times a day (BID) | ORAL | Status: DC
  Administered 2015-02-03 (×2): 200 mg via ORAL
  Filled 2015-02-03 (×3): qty 1

## 2015-02-03 MED ORDER — VORICONAZOLE 200 MG IV SOLR
6.0000 mg/kg | Freq: Two times a day (BID) | INTRAVENOUS | Status: AC
  Administered 2015-02-03 – 2015-02-04 (×2): 280 mg via INTRAVENOUS
  Filled 2015-02-03 (×4): qty 280

## 2015-02-03 NOTE — Progress Notes (Signed)
Pt is alert and oriented. No complaints durning night. Sr on Cm. Amiadrone drip is in progress. VSS now. UOp good. Lung sound coarse. Resting comfortably in bed. Will continue to observe closely.

## 2015-02-03 NOTE — Progress Notes (Signed)
ID E note Mold noted on sputum cx, bacterial cx pending.  CT noted and reviewed.  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   Agree with starting voriconazole - he will need lifelong thearpy Would consult Dr Genevive Bi to see if surgical removal of the LUL would be beneficial as he is unlikely to have a very satisfactory response to antifungal therapy alone.  Await AFB - if negative can Dc isolation Await final bacterial culture and can tailor abx to culture results and dc empiric vanco and zosyn once available I can be reached at [phone] tomorrow if needed

## 2015-02-03 NOTE — Consult Note (Signed)
ANTIBIOTIC CONSULT NOTE - INITIAL  Pharmacy Consult for vancomycin/zosyn Indication: rule out pneumonia and rule out sepsis  No Known Allergies  Patient Measurements: Height: 5\' 9"  (175.3 cm) Weight: 103 lb 9.9 oz (47 kg) IBW/kg (Calculated) : 70.7 Adjusted Body Weight:   Vital Signs: Temp: 97.6 F (36.4 C) (09/22 0800) Temp Source: Oral (09/22 0800) BP: 85/55 mmHg (09/22 1100) Pulse Rate: 81 (09/22 1100) Intake/Output from previous day: 09/21 0701 - 09/22 0700 In: 12061.1 [P.O.:680; I.V.:9931.1; IV Piggyback:1450] Out: 5284 [Urine:1475] Intake/Output from this shift: Total I/O In: 1376.8 [P.O.:360; I.V.:516.8; IV Piggyback:500] Out: 200 [Urine:200]  Labs:  Recent Labs  02/01/15 0734 02/02/15 0348 02/02/15 1221 02/03/15 0511  WBC 30.3* 31.0* 26.5*  --   HGB 10.3* 8.3* 8.3*  --   PLT 613* 485* 532*  --   CREATININE 0.97 0.71 0.69 0.67   Estimated Creatinine Clearance: 67.7 mL/min (by C-G formula based on Cr of 0.67). No results for input(s): VANCOTROUGH, VANCOPEAK, VANCORANDOM, GENTTROUGH, GENTPEAK, GENTRANDOM, TOBRATROUGH, TOBRAPEAK, TOBRARND, AMIKACINPEAK, AMIKACINTROU, AMIKACIN in the last 72 hours.   Microbiology: Recent Results (from the past 720 hour(s))  Blood culture (routine x 2)     Status: None (Preliminary result)   Collection Time: 02/01/15  7:41 AM  Result Value Ref Range Status   Specimen Description BLOOD LEFT HAND  Final   Special Requests   Final    BOTTLES DRAWN AEROBIC AND ANAEROBIC 2 CC AERO 1 CC ANAERO   Culture NO GROWTH 2 DAYS  Final   Report Status PENDING  Incomplete  Culture, expectorated sputum-assessment     Status: None   Collection Time: 02/01/15  7:42 AM  Result Value Ref Range Status   Specimen Description EXPECTORATED SPUTUM  Final   Special Requests Immunocompromised  Final   Sputum evaluation THIS SPECIMEN IS ACCEPTABLE FOR SPUTUM CULTURE  Final   Report Status 02/01/2015 FINAL  Final  Culture, respiratory  (NON-Expectorated)     Status: None (Preliminary result)   Collection Time: 02/01/15  7:42 AM  Result Value Ref Range Status   Specimen Description EXPECTORATED SPUTUM  Final   Special Requests Immunocompromised Reflexed from [id number]  Final   Gram Stain   Final    EXCELLENT SPECIMEN - 90-100% WBCS MANY WBC SEEN FEW GRAM NEGATIVE RODS MANY GRAM POSITIVE COCCI IN CHAINS    Culture MOLD SENT TO STATE FOR IDENTIFICATION  Final   Report Status PENDING  Incomplete  Blood culture (routine x 2)     Status: None (Preliminary result)   Collection Time: 02/01/15  7:43 AM  Result Value Ref Range Status   Specimen Description BLOOD RIGHT ARM  Final   Special Requests   Final    BOTTLES DRAWN AEROBIC AND ANAEROBIC 1 CC ANAERO 4 CC AERO   Culture NO GROWTH 2 DAYS  Final   Report Status PENDING  Incomplete  Urine culture     Status: None   Collection Time: 02/01/15  8:35 AM  Result Value Ref Range Status   Specimen Description URINE, CLEAN CATCH  Final   Special Requests NONE  Final   Culture NO GROWTH 1 DAY  Final   Report Status 02/02/2015 FINAL  Final  MRSA PCR Screening     Status: None   Collection Time: 02/01/15 12:50 PM  Result Value Ref Range Status   MRSA by PCR NEGATIVE NEGATIVE Final    Comment:        The GeneXpert MRSA Assay (FDA approved for NASAL specimens  only), is one component of a comprehensive MRSA colonization surveillance program. It is not intended to diagnose MRSA infection nor to guide or monitor treatment for MRSA infections.   Culture, expectorated sputum-assessment     Status: None   Collection Time: 02/02/15 12:10 PM  Result Value Ref Range Status   Specimen Description INDUCED SPUTUM  Final   Special Requests NONE  Final   Sputum evaluation THIS SPECIMEN IS ACCEPTABLE FOR SPUTUM CULTURE  Final   Report Status 02/02/2015 FINAL  Final    Medical History: Past Medical History  Diagnosis Date  . Seizures     Medications:  Scheduled:  . amiodarone   200 mg Oral BID  . antiseptic oral rinse  7 mL Mouth Rinse QID  . citalopram  20 mg Oral BH-q7a  . enoxaparin (LOVENOX) injection  40 mg Subcutaneous Q24H  . feeding supplement (ENSURE ENLIVE)  237 mL Oral TID  . folic acid  1 mg Oral Daily  . hydrocortisone sod succinate (SOLU-CORTEF) inj  50 mg Intravenous Q8H  . multivitamin with minerals  1 tablet Oral Daily  . nicotine  21 mg Transdermal Daily  . piperacillin-tazobactam (ZOSYN)  IV  3.375 g Intravenous 3 times per day  . potassium chloride  10 mEq Intravenous Q1 Hr x 4  . primidone  200 mg Oral QHS  . sodium chloride  3 mL Intravenous Q12H  . thiamine  100 mg Oral Daily  . topiramate  25 mg Oral BID  . vancomycin  750 mg Intravenous Q12H   Assessment: Pt is a 59 year old male who presents with SOB, cough, x-ray showing left lobe consolidation with possible pneumonia or tuberculosis. Pt also reports blood-tinged sputum  Goal of Therapy:  Vancomycin trough level 15-20 mcg/ml  Plan:  Will continue current therapy with vancomycin and Zosyn and check vancomycin trough at steady state this PM. Will f/u renal funciton and culture results.  Ulice Dash D 02/03/2015,11:55 AM

## 2015-02-03 NOTE — Progress Notes (Signed)
ANTIBIOTIC CONSULT NOTE - INITIAL  Pharmacy Consult for Vancomycin Indication: sepsis  No Known Allergies  Patient Measurements: Height: 5\' 9"  (175.3 cm) Weight: 103 lb 9.9 oz (47 kg) IBW/kg (Calculated) : 70.7 Adjusted Body Weight:   Vital Signs: Temp: 97.6 F (36.4 C) (09/22 1930) Temp Source: Oral (09/22 1930) BP: 108/66 mmHg (09/22 2130) Pulse Rate: 93 (09/22 2100) Intake/Output from previous day: 09/21 0701 - 09/22 0700 In: 12061.1 [P.O.:680; I.V.:9931.1; IV Piggyback:1450] Out: 8676 [Urine:1475] Intake/Output from this shift: Total I/O In: 450 [I.V.:300; IV Piggyback:150] Out: -   Labs:  Recent Labs  02/01/15 0734 02/02/15 0348 02/02/15 1221 02/03/15 0511  WBC 30.3* 31.0* 26.5*  --   HGB 10.3* 8.3* 8.3*  --   PLT 613* 485* 532*  --   CREATININE 0.97 0.71 0.69 0.67   Estimated Creatinine Clearance: 67.7 mL/min (by C-G formula based on Cr of 0.67).  Recent Labs  02/03/15 1955  [place] [address]     Microbiology: Recent Results (from the past 720 hour(s))  Blood culture (routine x 2)     Status: None (Preliminary result)   Collection Time: 02/01/15  7:41 AM  Result Value Ref Range Status   Specimen Description BLOOD LEFT HAND  Final   Special Requests   Final    BOTTLES DRAWN AEROBIC AND ANAEROBIC 2 CC AERO 1 CC ANAERO   Culture NO GROWTH 2 DAYS  Final   Report Status PENDING  Incomplete  Culture, expectorated sputum-assessment     Status: None   Collection Time: 02/01/15  7:42 AM  Result Value Ref Range Status   Specimen Description EXPECTORATED SPUTUM  Final   Special Requests Immunocompromised  Final   Sputum evaluation THIS SPECIMEN IS ACCEPTABLE FOR SPUTUM CULTURE  Final   Report Status 02/01/2015 FINAL  Final  Culture, respiratory (NON-Expectorated)     Status: None (Preliminary result)   Collection Time: 02/01/15  7:42 AM  Result Value Ref Range Status   Specimen Description EXPECTORATED SPUTUM  Final   Special Requests Immunocompromised  Reflexed from [id number]  Final   Gram Stain   Final    EXCELLENT SPECIMEN - 90-100% WBCS MANY WBC SEEN FEW GRAM NEGATIVE RODS MANY GRAM POSITIVE COCCI IN CHAINS    Culture MOLD SENT TO STATE FOR IDENTIFICATION  Final   Report Status PENDING  Incomplete  Blood culture (routine x 2)     Status: None (Preliminary result)   Collection Time: 02/01/15  7:43 AM  Result Value Ref Range Status   Specimen Description BLOOD RIGHT ARM  Final   Special Requests   Final    BOTTLES DRAWN AEROBIC AND ANAEROBIC 1 CC ANAERO 4 CC AERO   Culture NO GROWTH 2 DAYS  Final   Report Status PENDING  Incomplete  Urine culture     Status: None   Collection Time: 02/01/15  8:35 AM  Result Value Ref Range Status   Specimen Description URINE, CLEAN CATCH  Final   Special Requests NONE  Final   Culture NO GROWTH 1 DAY  Final   Report Status 02/02/2015 FINAL  Final  MRSA PCR Screening     Status: None   Collection Time: 02/01/15 12:50 PM  Result Value Ref Range Status   MRSA by PCR NEGATIVE NEGATIVE Final    Comment:        The GeneXpert MRSA Assay (FDA approved for NASAL specimens only), is one component of a comprehensive MRSA colonization surveillance program. It is not intended to diagnose  MRSA infection nor to guide or monitor treatment for MRSA infections.   Culture, expectorated sputum-assessment     Status: None   Collection Time: 02/02/15 12:10 PM  Result Value Ref Range Status   Specimen Description INDUCED SPUTUM  Final   Special Requests NONE  Final   Sputum evaluation THIS SPECIMEN IS ACCEPTABLE FOR SPUTUM CULTURE  Final   Report Status 02/02/2015 FINAL  Final  Culture, respiratory (NON-Expectorated)     Status: None (Preliminary result)   Collection Time: 02/02/15 12:10 PM  Result Value Ref Range Status   Specimen Description INDUCED SPUTUM  Final   Special Requests NONE Reflexed from [id number]  Final   Gram Stain PENDING  Incomplete   Culture   Final    MOLD PREVIOUS MOLD SENT TO THE  STATE [facility] FOR IDENTIFICATION   Report Status PENDING  Incomplete  C difficile quick scan w PCR reflex     Status: None   Collection Time: 02/03/15 10:33 AM  Result Value Ref Range Status   C Diff antigen NEGATIVE NEGATIVE Final   C Diff toxin NEGATIVE NEGATIVE Final   C Diff interpretation Negative for C. difficile  Final    Medical History: Past Medical History  Diagnosis Date  . Seizures     Medications:  Scheduled:  . amiodarone  200 mg Oral BID  . antiseptic oral rinse  7 mL Mouth Rinse QID  . citalopram  20 mg Oral BH-q7a  . enoxaparin (LOVENOX) injection  40 mg Subcutaneous Q24H  . feeding supplement (ENSURE ENLIVE)  237 mL Oral TID  . folic acid  1 mg Oral Daily  . hydrocortisone sod succinate (SOLU-CORTEF) inj  50 mg Intravenous Q8H  . multivitamin with minerals  1 tablet Oral Daily  . nicotine  21 mg Transdermal Daily  . piperacillin-tazobactam (ZOSYN)  IV  3.375 g Intravenous 3 times per day  . primidone  200 mg Oral QHS  . sodium chloride  3 mL Intravenous Q12H  . thiamine  100 mg Oral Daily  . topiramate  25 mg Oral BID  . [START ON 02/04/2015] vancomycin  750 mg Intravenous Q8H  . voriconazole (VFEND - PT WT </= 85 kg) IV  6 mg/kg Intravenous Q12H   Followed by  . [START ON 02/04/2015] voriconazole (VFEND - PT WT </= 85 kg) IV  4 mg/kg Intravenous Q12H   Assessment: CrCl = 67.7 ml/min  Goal of Therapy:  Vancomycin trough level 15-20 mcg/ml  Plan:  Expected duration 7 days with resolution of temperature and/or normalization of WBC   9/22:  Vanc trough @ 20:00 = 11 mcg/mL Will increase dose to Vancomycin 750 mg IV Q8H to start 9/23 @ 5:00. Will recheck vanc trough before 3rd new dose on 9/23 @ 20:30.   Jonathan [name] 02/03/2015,9:47 PM

## 2015-02-03 NOTE — Progress Notes (Signed)
Pharmacy Consult for Electrolyte Management  No Known Allergies  Patient Measurements: Height: 5\' 9"  (175.3 cm) Weight: 103 lb 9.9 oz (47 kg) IBW/kg (Calculated) : 70.7  Vital Signs: Temp: 97.6 F (36.4 C) (09/22 0800) Temp Source: Oral (09/22 0800) BP: 85/55 mmHg (09/22 1100) Pulse Rate: 81 (09/22 1100) Intake/Output from previous day: 09/21 0701 - 09/22 0700 In: 12061.1 [P.O.:680; I.V.:9931.1; IV Piggyback:1450] Out: 5638 [Urine:1475] Intake/Output from this shift: Total I/O In: 1376.8 [P.O.:360; I.V.:516.8; IV Piggyback:500] Out: 200 [Urine:200]  Labs:  Recent Labs  02/01/15 0734 02/02/15 0348 02/02/15 1221  WBC 30.3* 31.0* 26.5*  HGB 10.3* 8.3* 8.3*  HCT 30.3* 25.0* 25.7*  PLT 613* 485* 532*  APTT 59*  --  50*  INR 1.41  --  1.26     Recent Labs  02/01/15 0734 02/01/15 1319 02/02/15 0348 02/02/15 1221 02/03/15 0511  NA 135  --  134* 133* 138  K 3.3*  --  3.0* 2.8* 3.2*  CL 99*  --  106 105 111  CO2 24  --  25 21* 24  GLUCOSE 133*  --  112* 127* 130*  BUN 14  --  13 11 11   CREATININE 0.97  --  0.71 0.69 0.67  CALCIUM 8.3*  --  7.5* 7.1* 7.4*  MG  --  1.9  --   --  2.0  PHOS  --  3.4  --   --  2.5  PROT 6.6  --   --  4.7*  --   ALBUMIN 2.1*  --   --  1.5*  --   AST 26  --   --  49*  --   ALT 13*  --   --  20  --   ALKPHOS 105  --   --  80  --   BILITOT 0.5  --   --  0.4  --    Estimated Creatinine Clearance: 67.7 mL/min (by C-G formula based on Cr of 0.67).    Recent Labs  02/01/15 1214  GLUCAP 99    Medical History: Past Medical History  Diagnosis Date  . Seizures     Medications:  Scheduled:  . amiodarone  200 mg Oral BID  . antiseptic oral rinse  7 mL Mouth Rinse QID  . citalopram  20 mg Oral BH-q7a  . enoxaparin (LOVENOX) injection  40 mg Subcutaneous Q24H  . feeding supplement (ENSURE ENLIVE)  237 mL Oral TID  . folic acid  1 mg Oral Daily  . hydrocortisone sod succinate (SOLU-CORTEF) inj  50 mg Intravenous Q8H  .  multivitamin with minerals  1 tablet Oral Daily  . nicotine  21 mg Transdermal Daily  . piperacillin-tazobactam (ZOSYN)  IV  3.375 g Intravenous 3 times per day  . potassium chloride  10 mEq Intravenous Q1 Hr x 4  . primidone  200 mg Oral QHS  . sodium chloride  3 mL Intravenous Q12H  . thiamine  100 mg Oral Daily  . topiramate  25 mg Oral BID  . vancomycin  750 mg Intravenous Q12H   Infusions:  . sodium chloride 150 mL/hr at 02/03/15 7564  . amiodarone 30 mg/hr (02/03/15 0936)  . norepinephrine 0 mcg/min (02/03/15 0437)   PRN: acetaminophen **OR** acetaminophen, HYDROcodone-acetaminophen, LORazepam, senna-docusate  Assessment: Pharmacy consulted to assist in managing electrolytes in this 59 y/o M with acute respiratory failure due to LUL cavitary PNA.   Plan:  Calcium corrects to 9.4. Potassium remains low despite oral replacement yesterday.  Will give 4 runs of iv potassium chloride 10 meq today and f/u am labs.   Ulice Dash D 02/03/2015,11:52 AM

## 2015-02-03 NOTE — Progress Notes (Signed)
Informed by pharmacist and E-Link re: Sputum culture showing mold. ID service no longer available until Monday.   --Possibility of necrotizing pneumonia; will check CT scan.  --Start voriconazole, check galactomannan.  --Given recent elevation of troponin, would prefer to defer bronchoscopy for now.

## 2015-02-03 NOTE — Progress Notes (Signed)
RN made Dr. Benjie Karvonen aware that patient has had 3 loose stools in 24 hours per documentation. MD gave order to check stool for cdiff.

## 2015-02-03 NOTE — Clinical Social Work Note (Signed)
Clinical Social Work Assessment  Patient Details  Name: Jonathan [name] MRN: [id number] Date of Birth: 06/26/1955  Date of referral:  01/27/15               Reason for consult:   (Patient expressed concern that he was being used)                Permission sought to share information with:  Family Supports Permission granted to share information::  Yes, Verbal Permission Granted  Name::        Agency::     Relationship::     Contact Information:     Housing/Transportation Living arrangements for the past 2 months:  Single Family Home Source of Information:  Patient Patient Interpreter Needed:  None Criminal Activity/Legal Involvement Pertinent to Current Situation/Hospitalization:  No - Comment as needed Significant Relationships:  Parents Lives with:  Friends Do you feel safe going back to the place where you live?  Yes Need for family participation in patient care:  No (Coment)  Care giving concerns:  Patient was independent in all ADL's prior to hospitalization   Social Worker assessment / plan:  CSW met with patient this afternoon and patient was sitting up in his bed. Patient's thoughts were somewhat scattered when talking with CSW. CSW asked patient if he lived alone and he initially said yes but then stated that he has a friend that lives with him: [name]. He began talking about how Mr. Vernona Rieger left his wife and has come to live with him. He could not tell me how long his friend has been living with him. 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 Patient could not tell CSW how he gets money to pay his bills. Patient then stated that "someone is trying to kill me or take my house from me." I asked patient why he felt this way and he stated that he has heard a car circling his home. When asked if he has seen the car, patient replied he had but could not give description. Patient stated that he still felt safe  returning home when time.  CSW asked patient if he had a history of mental illness and patient replied "not that I know of." Patient was able to tell CSW that he was in the hospital and that the month was September and the day was the 22nd and the president is Obama. CSW asked patient if he would give me permission to speak with his father and patient stated that I could call his father.   CSW called patient's father on phone: Jonathan [name]: [phone]. Mr. Sausedo stated that he last saw his son 3 weeks ago at patient's home. Mr. Dotzler stated that patient's home was relatively clean. He stated that he helps his son pay his bills and get his son necessities such as food. Mr. Lauderback stated that there is a limit to what he can do for his son because he also cares for his terminally ill wife. Mr. Pennypacker verified that patient does have a friend living with him and that he used to be a painter years ago but that it has been a very long time since he held a job. Mr. Siems stated that his son finished high school. He also stated that his son had not been diagnosed with a mental illness or a learning disability that he knew of.   It  is unclear what patient may need at discharge at this time. Patient is currently on isolation to rule out TB.    Employment status:  Unemployed Forensic scientist:  Self Pay (Medicaid Pending) PT Recommendations:  Not assessed at this time Information / Referral to community resources:     Patient/Family's Response to care:  Patient was slow to respond with answers and was scattered in his thoughts but was alert and oriented X4.   Patient/Family's Understanding of and Emotional Response to Diagnosis, Current Treatment, and Prognosis:  It is unclear if patient understands the gravity of his current illness but is hopeful he will be able to return home soon.  Emotional Assessment Appearance:  Appears older than stated age Attitude/Demeanor/Rapport:  Paranoid  (pleasant and cooperative) Affect (typically observed):  Calm, Afraid/Fearful Orientation:  Oriented to Self, Oriented to Place, Oriented to  Time, Oriented to Situation Alcohol / Substance use:  Not Applicable Psych involvement (Current and /or in the community):  No (Comment)  Discharge Needs  Concerns to be addressed:  Discharge Planning Concerns Readmission within the last 30 days:  No Current discharge risk:   (Patient is concerned someone is trying to kill him or trying to take his house) Barriers to Discharge:  No Barriers Identified   Shela Leff, LCSW 02/03/2015, 3:18 PM

## 2015-02-03 NOTE — Consult Note (Signed)
[facility] Consultation     ASSESSMENT/PLAN   59 year old male with acute respiratory failure likely secondary to Left-sided pneumonia. Complicated by septic shock with type II myocardial infarction due to demand ischemia, and atrial fibrillation with rapid ventricular rate.  PULMONARY  A: Left-sided pneumonia with acute respiratory failure. P:   -Continue daily sputum and daily 3 days, to rule out TB. Currently, all sputum cultures and sputum AFB are pending. -May need to consider bronchoscopy if The patient is not symptomatically improving.  CARDIOVASCULAR  A: Type II, myocardial infarction, likely stress-induced ischemia. Atrial fibrillation with RVR. Hypotension due to septic shock and atrial fibrillation. P:  Heart disease services, following, we will switch the IV amiodarone to by mouth Today. Continue IV fluids. Wean down the Levophed as tolerated. -The patient is also on empiric IV steroids.  RENAL A:  Kidney function appears to be stable at this time. P:   Continue fluid resuscitation for septic shock. Continue steroids.  GASTROINTESTINAL Continue by mouth diet.  HEMATOLOGIC A:  Leukocytosis, with left shift, likely due to pneumonia with sepsis P:  Continue to monitor.  INFECTIOUS A:  Pneumonia with sepsis. Infectious disease services, following. -Rule out TB. P:    Sputum 02/01/2015; sputum culture 2, and sputum for AFB are all pending at this time. We'll continue to check sputum AFB daily 3 total. Abx: Vancomycin, Zosyn, will continue per infectious disease recommendations.  ENDOCRINE --  NEUROLOGIC --  --Right upper extremity dual lumen PICC line placed 02/02/2015.  ---------------------------------------  ---------------------------------------   Name: Jonathan [name] MRN: [id number] DOB: 06/13/1955    ADMISSION DATE:  02/01/2015 CONSULTATION DATE:  02/02/2015.  REFERRING MD :  Dr. Benjie Karvonen  CHIEF COMPLAINT:   Dyspnea.    HISTORY OF PRESENT ILLNESS:    Patient has no new complaints today. He appears to be looking and feeling better today.   REVIEW OF SYSTEMS:   Constitutional: Feels well. Cardiovascular: No chest pain.  Pulmonary: Denies dyspnea.   The remainder of systems were reviewed and were found to be negative other than what is documented in the HPI.    VITAL SIGNS: Temp:  [97.6 F (36.4 C)-99.9 F (37.7 C)] 97.6 F (36.4 C) (09/22 0800) Pulse Rate:  [74-153] 81 (09/22 1100) Resp:  [17-39] 17 (09/22 1100) BP: (76-119)/(48-92) 85/55 mmHg (09/22 1100) SpO2:  [75 %-100 %] 100 % (09/22 1100) HEMODYNAMICS:   VENTILATOR SETTINGS:   INTAKE / OUTPUT:  Intake/Output Summary (Last 24 hours) at 02/03/15 1159 Last data filed at 02/03/15 1100  Gross per 24 hour  Intake 11410.59 ml  Output   1675 ml  Net 9735.59 ml    Physical Examination:   VS: BP 85/55 mmHg  Pulse 81  Temp(Src) 97.6 F (36.4 C) (Oral)  Resp 17  Ht 5\' 9"  (1.753 m)  Wt 47 kg (103 lb 9.9 oz)  BMI 15.29 kg/m2  SpO2 100%  General Appearance: No distress  Neuro:without focal findings,  HEENT: PERRLA, EOM intact, no ptosis, no other lesions noticed;  Pulmonary: normal breath sounds., diaphragmatic excursion normal.No wheezing, No rales;     CardiovascularNormal S1,S2.  No m/r/g.    Abdomen: Benign, Soft, non-tender, No masses, hepatosplenomegaly, No lymphadenopathy Renal:  No costovertebral tenderness  GU:  Not performed at this time. Endoc: No evident thyromegaly, no signs of acromegaly. Skin:   warm, no rashes, no ecchymosis  Extremities: normal, no cyanosis, clubbing, no edema, warm with normal capillary refill.    LABS: Reviewed  LABORATORY PANEL:   CBC  Recent Labs Lab 02/02/15 1221  WBC 26.5*  HGB 8.3*  HCT 25.7*  PLT 532*    Chemistries   Recent Labs Lab 02/02/15 1221 02/03/15 0511  NA 133* 138  K 2.8* 3.2*  CL 105 111  CO2 21* 24  GLUCOSE 127* 130*  BUN 11 11    CREATININE 0.69 0.67  CALCIUM 7.1* 7.4*  MG  --  2.0  PHOS  --  2.5  AST 49*  --   ALT 20  --   ALKPHOS 80  --   BILITOT 0.4  --      Recent Labs Lab 02/01/15 1214  GLUCAP 99   No results for input(s): PHART, PCO2ART, PO2ART in the last 168 hours.  Recent Labs Lab 02/01/15 0734 02/02/15 1221  AST 26 49*  ALT 13* 20  ALKPHOS 105 80  BILITOT 0.5 0.4  ALBUMIN 2.1* 1.5*    Cardiac Enzymes  Recent Labs Lab 02/02/15 1221  TROPONINI 0.04*    RADIOLOGY:  Dg Chest Port 1 View  02/02/2015   CLINICAL DATA:  PICC line placement  EXAM: PORTABLE CHEST - 1 VIEW  COMPARISON:  02/02/2015  FINDINGS: Right PICC line has been placed.  The tip is in the SVC.  Continued dense consolidation in the left upper lobe, unchanged. Heart is normal size. No confluent opacity on the right.  IMPRESSION: Right PICC line tip in the SVC.  No change in the dense consolidation in the left upper lobe.   Electronically Signed   By: Rolm Baptise M.D.   On: 02/02/2015 15:13   Dg Chest Port 1 View  02/02/2015   CLINICAL DATA:  Pneumonia. Shortness of breath. Weight loss. Nights with  EXAM: PORTABLE CHEST - 1 VIEW  COMPARISON:  02/01/2015  FINDINGS: Dense consolidation again noted in the upper lobe, unchanged. Questionable central cavitation. No confluent opacity on the right. Heart is normal size. No visible effusions.  IMPRESSION: Stable dense consolidation in the left upper lobe with questionable areas of central cavitation.   Electronically Signed   By: Rolm Baptise M.D.   On: 02/02/2015 11:07       --Marda Stalker, MD.  Board Certified in Internal Medicine, Pulmonary Medicine, [place], and Sleep Medicine.  [facility]  Patricia Pesa, M.D.  Vilinda Boehringer, M.D.  Merton Border, M.D   02/03/2015, 11:59 AM  [place].  I have personally obtained a history, examined the patient, evaluated laboratory and imaging results, formulated the assessment  and plan and placed orders. The Patient requires high complexity decision making for assessment and support, frequent evaluation and titration of therapies, application of advanced monitoring technologies and extensive interpretation of multiple databases. The patient has critical illness that could lead imminently to failure of 1 or more organ systems and requires the highest level of physician preparedness to intervene.  Critical Care Time devoted to patient care services described in this note is 35 minutes and is exclusive of time spent in procedures.

## 2015-02-03 NOTE — Progress Notes (Signed)
Returned from [place]. A&Ox4. Denies pain. VSS. NSR-Stach per cardiac monitor. Afebrile. Appetite improving.

## 2015-02-03 NOTE — Progress Notes (Signed)
RN spoke with Dr. Ashby Dawes and asked about patient traveling to CT scan without RN. MD aware that patient has been off levophed drip since 0400 and is in NSR. Dr. Ashby Dawes stated "yes, he can go to CT without the nurse."

## 2015-02-03 NOTE — Progress Notes (Signed)
[place] [facility] [place] NAME: Jonathan [name]    MR#:  [id number]  DATE OF BIRTH:  05-Apr-1956  SUBJECTIVE:  Patient is currently on amiodarone drip. He continues to have low blood pressure and is also on levo fed. He reports diarrhea  REVIEW OF SYSTEMS:    Review of Systems  Constitutional: Positive for weight loss. Negative for fever, chills and malaise/fatigue.  HENT: Negative for sore throat.   Eyes: Negative for blurred vision.  Respiratory: Positive for cough. Negative for hemoptysis, shortness of breath and wheezing.   Cardiovascular: Negative for chest pain, palpitations and leg swelling.  Gastrointestinal: Positive for diarrhea. Negative for nausea, vomiting, abdominal pain and blood in stool.  Genitourinary: Negative for dysuria.  Musculoskeletal: Negative for back pain.  Neurological: Positive for weakness. Negative for dizziness, tremors and headaches.  Endo/Heme/Allergies: Does not bruise/bleed easily.  Psychiatric/Behavioral: Negative for depression.    Tolerating Diet: Yes      DRUG ALLERGIES:  No Known Allergies  VITALS:  Blood pressure 85/55, pulse 81, temperature 97.6 F (36.4 C), temperature source Oral, resp. rate 17, height 5\' 9"  (1.753 m), weight 47 kg (103 lb 9.9 oz), SpO2 100 %.  PHYSICAL EXAMINATION:   Physical Exam  Constitutional: He is oriented to person, place, and time and well-developed, well-nourished, and in no distress. No distress.  Frail weak  HENT:  Head: Normocephalic.  Eyes: No scleral icterus.  Neck: Normal range of motion. Neck supple. No JVD present. No tracheal deviation present.  Cardiovascular: Normal heart sounds.  Exam reveals no gallop and no friction rub.   No murmur heard. Tachycardic, irregular, irregular  Pulmonary/Chest: Effort normal. No respiratory distress. He has wheezes. He has no rales. He exhibits no tenderness.  Abdominal: Soft. Bowel sounds are normal. He  exhibits no distension and no mass. There is no tenderness. There is no rebound and no guarding.  Musculoskeletal: Normal range of motion. He exhibits no edema.  Neurological: He is alert and oriented to person, place, and time.  Skin: Skin is warm. No rash noted. He is not diaphoretic. No erythema.  Psychiatric: Affect and judgment normal.      LABORATORY PANEL:   CBC  Recent Labs Lab 02/02/15 1221  WBC 26.5*  HGB 8.3*  HCT 25.7*  PLT 532*   ------------------------------------------------------------------------------------------------------------------  Chemistries   Recent Labs Lab 02/02/15 1221 02/03/15 0511  NA 133* 138  K 2.8* 3.2*  CL 105 111  CO2 21* 24  GLUCOSE 127* 130*  BUN 11 11  CREATININE 0.69 0.67  CALCIUM 7.1* 7.4*  MG  --  2.0  AST 49*  --   ALT 20  --   ALKPHOS 80  --   BILITOT 0.4  --    ------------------------------------------------------------------------------------------------------------------  Cardiac Enzymes  Recent Labs Lab 02/01/15 1319 02/01/15 2355 02/02/15 1221  TROPONINI 2.59* 0.05* 0.04*   ------------------------------------------------------------------------------------------------------------------  RADIOLOGY:  Dg Chest Port 1 View  02/02/2015   CLINICAL DATA:  PICC line placement  EXAM: PORTABLE CHEST - 1 VIEW  COMPARISON:  02/02/2015  FINDINGS: Right PICC line has been placed.  The tip is in the SVC.  Continued dense consolidation in the left upper lobe, unchanged. Heart is normal size. No confluent opacity on the right.  IMPRESSION: Right PICC line tip in the SVC.  No change in the dense consolidation in the left upper lobe.   Electronically Signed   By: Rolm Baptise M.D.   On: 02/02/2015  15:13   Dg Chest Port 1 View  02/02/2015   CLINICAL DATA:  Pneumonia. Shortness of breath. Weight loss. Nights with  EXAM: PORTABLE CHEST - 1 VIEW  COMPARISON:  02/01/2015  FINDINGS: Dense consolidation again noted in the upper  lobe, unchanged. Questionable central cavitation. No confluent opacity on the right. Heart is normal size. No visible effusions.  IMPRESSION: Stable dense consolidation in the left upper lobe with questionable areas of central cavitation.   Electronically Signed   By: Rolm Baptise M.D.   On: 02/02/2015 11:07     ASSESSMENT AND PLAN:   59 year old male who presents with acute respiratory failure secondary to left upper lobe cavitation with septic shock.  1. Acute hypoxic respiratory failure: This is secondary to left upper lobe cavitation: We are currently ruling out tuberculosis. AFBs are pending. Patient is on broad-spectrum antibiotics including Zosyn and vancomycin. Blood cultures are negative to date. Patient is being followed by infectious disease and pulmonary. Patient will eventually need a chest CT to evaluate for possible underlying lung mass.  2. Septic shock: This is secondary to left upper lobe cavitation. keep an AP greater than 65. Continue Levothroid.    3. Atrial fibrillation RVR: This is likely secondary to problem #1 and 2. Patient is on amiodarone drip. Cardiology is following patient consultation. Patient underwent 2-D echocardiogram which showed normal ejection fraction and no major valvular abnormalities and/or endocarditis. Continue to follow heart rate on amiodarone. likely will transition to oral amiodarone in the a.m.  4. Elevated troponin: It is reported that the second troponin is 2.59 and then subsequently 0.05 I believes that there is this probably an error.   5. Hypokalemia: This will be repleted and rechecked in a.m. Magnesium level was normal. 6. Leukocytosis: Likely secondary to pneumonia and cavitary lesion. Improving  7. Diarrhea: C. difficile will be checked.   Management plans discussed with the patient and he is in agreement.   patient remains critically ill and high risk of cardiopulmonary failure    CODE STATUS: Full    critical care TOTAL TIME  TAKING CARE OF THIS PATIENT: 35 minutes.     POSSIBLE D/C ??, DEPENDING ON CLINICAL CONDITION.   MODY, SITAL M.D on 02/03/2015 at 11:52 AM  Between 7am to 6pm - Pager - [phone] After 6pm go to [email or web address] - password [email or web address]  [facility]  Office  [phone]  CC: Primary care physician; No primary care provider on file.

## 2015-02-04 ENCOUNTER — Inpatient Hospital Stay: Payer: Medicaid Other

## 2015-02-04 LAB — CBC
HEMATOCRIT: 26.6 % — AB (ref 40.0–52.0)
Hemoglobin: 8.7 g/dL — ABNORMAL LOW (ref 13.0–18.0)
MCH: 30.1 pg (ref 26.0–34.0)
MCHC: 32.5 g/dL (ref 32.0–36.0)
MCV: 92.7 fL (ref 80.0–100.0)
PLATELETS: 538 10*3/uL — AB (ref 150–440)
RBC: 2.87 MIL/uL — AB (ref 4.40–5.90)
RDW: 14.9 % — ABNORMAL HIGH (ref 11.5–14.5)
WBC: 25.6 10*3/uL — AB (ref 3.8–10.6)

## 2015-02-04 LAB — BASIC METABOLIC PANEL
ANION GAP: 3 — AB (ref 5–15)
BUN: 10 mg/dL (ref 6–20)
CO2: 25 mmol/L (ref 22–32)
Calcium: 7.6 mg/dL — ABNORMAL LOW (ref 8.9–10.3)
Chloride: 111 mmol/L (ref 101–111)
Creatinine, Ser: 0.61 mg/dL (ref 0.61–1.24)
GFR calc Af Amer: >60 mL/min (ref >60)
GLUCOSE: 114 mg/dL — AB (ref 65–99)
POTASSIUM: 3 mmol/L — AB (ref 3.5–5.1)
Sodium: 139 mmol/L (ref 135–145)

## 2015-02-04 LAB — VANCOMYCIN, TROUGH: VANCOMYCIN TR: 17 ug/mL (ref 10–20)

## 2015-02-04 LAB — PHOSPHORUS: PHOSPHORUS: 2 mg/dL — AB (ref 2.5–4.6)

## 2015-02-04 LAB — MAGNESIUM: MAGNESIUM: 2 mg/dL (ref 1.7–2.4)

## 2015-02-04 MED ORDER — POTASSIUM CHLORIDE 10 MEQ/100ML IV SOLN
10.0000 meq | INTRAVENOUS | Status: AC
  Administered 2015-02-04 (×3): 10 meq via INTRAVENOUS

## 2015-02-04 MED ORDER — GUAIFENESIN ER 600 MG PO TB12
600.0000 mg | ORAL_TABLET | Freq: Two times a day (BID) | ORAL | Status: DC
  Administered 2015-02-04 – 2015-02-10 (×13): 600 mg via ORAL
  Filled 2015-02-04 (×13): qty 1

## 2015-02-04 MED ORDER — POTASSIUM CHLORIDE 10 MEQ/100ML IV SOLN
10.0000 meq | INTRAVENOUS | Status: DC
  Administered 2015-02-04 (×3): 10 meq via INTRAVENOUS
  Filled 2015-02-04 (×6): qty 100

## 2015-02-04 MED ORDER — DEXTROMETHORPHAN POLISTIREX ER 30 MG/5ML PO SUER
30.0000 mg | Freq: Two times a day (BID) | ORAL | Status: DC
  Administered 2015-02-04 – 2015-02-10 (×13): 30 mg via ORAL
  Filled 2015-02-04 (×16): qty 5

## 2015-02-04 MED ORDER — DM-GUAIFENESIN ER 30-600 MG PO TB12: 1.0000 | ORAL_TABLET | Freq: Two times a day (BID) | ORAL | Status: DC

## 2015-02-04 MED ORDER — POTASSIUM PHOSPHATES 15 MMOLE/5ML IV SOLN
30.0000 mmol | Freq: Once | INTRAVENOUS | Status: AC
  Administered 2015-02-04: 30 mmol via INTRAVENOUS
  Filled 2015-02-04: qty 10

## 2015-02-04 MED ORDER — AMIODARONE HCL 200 MG PO TABS
200.0000 mg | ORAL_TABLET | Freq: Every day | ORAL | Status: DC
  Administered 2015-02-04 – 2015-02-10 (×7): 200 mg via ORAL
  Filled 2015-02-04 (×6): qty 1

## 2015-02-04 NOTE — Consult Note (Signed)
[facility] Consultation     ASSESSMENT/PLAN   59 year old male with acute respiratory failure likely secondary to Left-sided pneumonia. Complicated by septic shock with type II myocardial infarction due to demand ischemia, and atrial fibrillation with rapid ventricular rate.  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 P:   -Currently. All sputum AFBs are pending. We'll await results, if negative, can DC isolation. -Sputum specimen shows mold, which is been sent to the state for further isolation. We'll also send sputum for silver stain. -Continue broad-spectrum antibiotics, as well as voriconazole. We'll consider surgical consultation. Once the patient is more stable. -Bilateral pleural effusions, greater on the left, suspect secondary to recent episode of sepsis with aggressive fluid rehydration. -Discontinue steroids.  CARDIOVASCULAR  A: Type II, myocardial infarction, likely stress-induced ischemia. Atrial fibrillation with RVR. Hypotension due to septic shock and atrial fibrillation. P:  -Continue amiodarone.  RENAL A:  Kidney function appears to be stable at this time. P:   Appears improved, will decrease IV fluids.  GASTROINTESTINAL Continue by mouth diet.  HEMATOLOGIC A:  Leukocytosis, with left shift, likely due to pneumonia with sepsis, as well as steroids. P:  Continue to monitor.  INFECTIOUS A:  Pneumonia with sepsis. Infectious disease services, following. -Rule out TB. P:    Sputum 02/01/2015; sputum culture 2, and sputum for AFB are all pending at this time. We'll continue to check sputum AFB daily 3 total. Abx: Vancomycin, Zosyn, will continue per infectious disease recommendations. Discontinue steroids, given presence of possible aspergillosis.  ENDOCRINE --  NEUROLOGIC --  --Right upper extremity dual lumen PICC line placed 02/02/2015.  --Patient  would be appear to be stable to transfer out of the intensive care unit.  ---------------------------------------  ---------------------------------------   Name: RENDON HOWELL MRN: [id number] DOB: 1956-01-08    ADMISSION DATE:  02/01/2015 CONSULTATION DATE:  02/02/2015.  REFERRING MD :  Dr. Benjie Karvonen  CHIEF COMPLAINT:  Dyspnea.    HISTORY OF PRESENT ILLNESS:    Patient has no new complaints today. He appears to be looking and feeling better today.   REVIEW OF SYSTEMS:   Constitutional: Feels well. Cardiovascular: No chest pain.  Pulmonary: Denies dyspnea.   The remainder of systems were reviewed and were found to be negative other than what is documented in the HPI.    VITAL SIGNS: Temp:  [97.6 F (36.4 C)-97.9 F (36.6 C)] 97.7 F (36.5 C) (09/23 0820) Pulse Rate:  [69-105] 93 (09/23 0800) Resp:  [17-36] 25 (09/23 0800) BP: (80-135)/(48-97) 101/59 mmHg (09/23 0800) SpO2:  [98 %-100 %] 100 % (09/23 0800) HEMODYNAMICS:   VENTILATOR SETTINGS:   INTAKE / OUTPUT:  Intake/Output Summary (Last 24 hours) at 02/04/15 0913 Last data filed at 02/04/15 0859  Gross per 24 hour  Intake 5630.1 ml  Output   1250 ml  Net 4380.1 ml    Physical Examination:   VS: BP 101/59 mmHg  Pulse 93  Temp(Src) 97.7 F (36.5 C) (Oral)  Resp 25  Ht 5\' 9"  (1.753 m)  Wt 47 kg (103 lb 9.9 oz)  BMI 15.29 kg/m2  SpO2 100%  General Appearance: No distress  Neuro:without focal findings,  HEENT: PERRLA, EOM intact, no ptosis, no other lesions noticed;  Pulmonary: normal breath sounds., diaphragmatic excursion normal.No wheezing, No rales;     CardiovascularNormal S1,S2.  No m/r/g.    Abdomen: Benign, Soft, non-tender, No masses, hepatosplenomegaly, No lymphadenopathy Renal:  No costovertebral tenderness  GU:  Not performed at this time. Endoc: No evident thyromegaly, no signs of acromegaly. Skin:   warm, no rashes, no ecchymosis  Extremities: normal, no cyanosis, clubbing, no edema,  warm with normal capillary refill.    LABS: Reviewed   LABORATORY PANEL:   CBC  Recent Labs Lab 02/04/15 0559  WBC 25.6*  HGB 8.7*  HCT 26.6*  PLT 538*    Chemistries   Recent Labs Lab 02/02/15 1221  02/04/15 0559  NA 133*  < > 139  K 2.8*  < > 3.0*  CL 105  < > 111  CO2 21*  < > 25  GLUCOSE 127*  < > 114*  BUN 11  < > 10  CREATININE 0.69  < > 0.61  CALCIUM 7.1*  < > 7.6*  MG  --   < > 2.0  PHOS  --   < > 2.0*  AST 49*  --   --   ALT 20  --   --   ALKPHOS 80  --   --   BILITOT 0.4  --   --   < > = values in this interval not displayed.   Recent Labs Lab 02/01/15 1214  GLUCAP 99   No results for input(s): PHART, PCO2ART, PO2ART in the last 168 hours.  Recent Labs Lab 02/01/15 0734 02/02/15 1221  AST 26 49*  ALT 13* 20  ALKPHOS 105 80  BILITOT 0.5 0.4  ALBUMIN 2.1* 1.5*    Cardiac Enzymes  Recent Labs Lab 02/02/15 1221  TROPONINI 0.04*    RADIOLOGY:  Dg Chest 1 View  02/04/2015   CLINICAL DATA:  Shortness of breath.  Seizure.  EXAM: CHEST 1 VIEW  COMPARISON:  CT 02/03/2015.  Chest x-ray 02/02/2015.  FINDINGS: Right PICC line in good anatomic position. Large cavitary lesion in the left upper lobe is again noted. Mild infiltrate in the right upper lobe and left lung base cannot be excluded. Tiny pleural effusions. No pneumothorax. Heart size normal.  IMPRESSION: 1. Persistent unchanged cavitary lesion in the left upper lobe. 2. Mild infiltrate right upper lobe and left lower lobe cannot be excluded. Tiny bilateral pleural effusions. 3. Right PICC line stable position .   Electronically Signed   By: Marcello Moores  Register   On: 02/04/2015 07:33   Ct Chest W Contrast  02/03/2015   CLINICAL DATA:  58 year old male with acute respiratory failure likely secondary to Left-sided pneumonia. Complicated by septic shock with type II myocardial infarction due to demand ischemia, and atrial fibrillation with rapid ventricular rate.^110mL OMNIPAQUE IOHEXOL 300 MG/ML  SOLN.  EXAM: CT CHEST WITH CONTRAST  TECHNIQUE: Multidetector CT imaging of the chest was performed during intravenous contrast administration.  CONTRAST:  63mL OMNIPAQUE IOHEXOL 300 MG/ML  SOLN  COMPARISON:  Chest x-ray 02/02/2015  FINDINGS: Heart: Heart size is normal. Coronary artery calcifications are present. Central line/PICC tip to the lower superior vena cava.  Vascular structures: Pulmonary arteries are grossly normally opacified. Thoracic aorta is normal in appearance.  Mediastinum/thyroid: The visualized portion of the thyroid gland has a normal appearance. Mediastinal lymph nodes are present. Pretracheal lymph node is 0.9 cm. Subcarinal lymph node is 1.2 cm. The small left hilar lymph nodes are present, largest measuring approximate 1.0 cm.  Lungs/Airways: Within the left lung apex new there is a large cavitary lesion containing a rounded mass measuring 2 3.8 x 3.3 cm. 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 There is significant airspace disease surrounding this cavitary lesion, occupying the  entire upper lobe. There patchy areas of infiltrate within the left lower lobe. Similar patchy densities identified within the right upper lobe. There are bilateral pleural effusions.  Upper abdomen: Possible low-attenuation lesion within the central portion of the right kidney versus hydronephrosis or parapelvic cyst. This abnormality is incompletely evaluated as it is on the last image of this study. Gallbladder is present and is contracted. There is diffuse body wall edema. Stomach is distended.  Chest wall/osseous structures: Unremarkable.  IMPRESSION: 1. 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 The cavitary lesion may be the result of fungal infection, TB, or malignancy. 2. Patchy areas of infiltrate within the right upper lobe and left lower lobe. 3. Bilateral pleural effusions. 4. Coronary artery disease. 5. Right-sided central line/PICC to  the lower superior vena cava. 6. Small mediastinal and hilar lymph nodes may be reactive. 7. Low-attenuation structure within the central portion of the right kidney is incompletely evaluated. Consider further evaluation with renal ultrasound. 8. These results will be called to the ordering clinician or representative by the Radiologist Assistant, and communication documented in the PACS or zVision Dashboard.   Electronically Signed   By: Nolon Nations M.D.   On: 02/03/2015 19:11   Dg Chest Port 1 View  02/02/2015   CLINICAL DATA:  PICC line placement  EXAM: PORTABLE CHEST - 1 VIEW  COMPARISON:  02/02/2015  FINDINGS: Right PICC line has been placed.  The tip is in the SVC.  Continued dense consolidation in the left upper lobe, unchanged. Heart is normal size. No confluent opacity on the right.  IMPRESSION: Right PICC line tip in the SVC.  No change in the dense consolidation in the left upper lobe.   Electronically Signed   By: Rolm Baptise M.D.   On: 02/02/2015 15:13   Dg Chest Port 1 View  02/02/2015   CLINICAL DATA:  Pneumonia. Shortness of breath. Weight loss. Nights with  EXAM: PORTABLE CHEST - 1 VIEW  COMPARISON:  02/01/2015  FINDINGS: Dense consolidation again noted in the upper lobe, unchanged. Questionable central cavitation. No confluent opacity on the right. Heart is normal size. No visible effusions.  IMPRESSION: Stable dense consolidation in the left upper lobe with questionable areas of central cavitation.   Electronically Signed   By: Rolm Baptise M.D.   On: 02/02/2015 11:07       --Marda Stalker, MD.  Board Certified in Internal Medicine, Pulmonary Medicine, [place], and Sleep Medicine.  [facility]  Patricia Pesa, M.D.  Vilinda Boehringer, M.D.  Merton Border, M.D   02/04/2015, 9:13 AM

## 2015-02-04 NOTE — Care Management Note (Signed)
Case Management Note  Patient Details  Name: Jonathan [name] MRN: [id number] Date of Birth: [birth year]  Subjective/Objective:  Admitted with cough, shortness of breath and night sweats. Ruling out TB. WBC > 25.                    Action/Plan:  Will follow progression and assess/assist as needed.   Expected Discharge Date:                  Expected Discharge Plan:     In-House Referral:  Clinical Social Work  Discharge planning Services     Post Acute Care Choice:    Choice offered to:     DME Arranged:    DME Agency:     HH Arranged:    [place] Agency:     Status of Service:  In process, will continue to follow  Medicare Important Message Given:    Date Medicare IM Given:    Medicare IM give by:    Date Additional Medicare IM Given:    Additional Medicare Important Message give by:     If discussed at [place] of Stay Meetings, dates discussed:    Additional Comments:  Jolly Mango, RN 02/04/2015, 9:37 AM

## 2015-02-04 NOTE — Consult Note (Signed)
ANTIBIOTIC CONSULT NOTE - INITIAL  Pharmacy Consult for vancomycin/zosyn Indication: rule out pneumonia and rule out sepsis  No Known Allergies  Patient Measurements: Height: 5\' 9"  (175.3 cm) Weight: 103 lb 9.9 oz (47 kg) IBW/kg (Calculated) : 70.7 Adjusted Body Weight:   Vital Signs: Temp: 97.7 F (36.5 C) (09/23 0820) Temp Source: Oral (09/23 0820) BP: 101/59 mmHg (09/23 0800) Pulse Rate: 93 (09/23 0800) Intake/Output from previous day: 09/22 0701 - 09/23 0700 In: 5529.5 [P.O.:960; I.V.:3313.5; IV Piggyback:1256] Out: 1250 [Urine:1250] Intake/Output from this shift: Total I/O In: 1566.5 [P.O.:584; I.V.:422.5; IV Piggyback:560] Out: 250 [Urine:250]  Labs:  Recent Labs  02/02/15 0348 02/02/15 1221 02/03/15 0511 02/04/15 0559  WBC 31.0* 26.5*  --  25.6*  HGB 8.3* 8.3*  --  8.7*  PLT 485* 532*  --  538*  CREATININE 0.71 0.69 0.67 0.61   Estimated Creatinine Clearance: 67.7 mL/min (by C-G formula based on Cr of 0.61).  Recent Labs  02/03/15 1955  [place] 11     Microbiology: Recent Results (from the past 720 hour(s))  Blood culture (routine x 2)     Status: None (Preliminary result)   Collection Time: 02/01/15  7:41 AM  Result Value Ref Range Status   Specimen Description BLOOD LEFT HAND  Final   Special Requests   Final    BOTTLES DRAWN AEROBIC AND ANAEROBIC 2 CC AERO 1 CC ANAERO   Culture NO GROWTH 2 DAYS  Final   Report Status PENDING  Incomplete  Culture, expectorated sputum-assessment     Status: None   Collection Time: 02/01/15  7:42 AM  Result Value Ref Range Status   Specimen Description EXPECTORATED SPUTUM  Final   Special Requests Immunocompromised  Final   Sputum evaluation THIS SPECIMEN IS ACCEPTABLE FOR SPUTUM CULTURE  Final   Report Status 02/01/2015 FINAL  Final  Culture, respiratory (NON-Expectorated)     Status: None (Preliminary result)   Collection Time: 02/01/15  7:42 AM  Result Value Ref Range Status   Specimen Description  EXPECTORATED SPUTUM  Final   Special Requests Immunocompromised Reflexed from [id number]  Final   Gram Stain   Final    EXCELLENT SPECIMEN - 90-100% WBCS MANY WBC SEEN FEW GRAM NEGATIVE RODS MANY GRAM POSITIVE COCCI IN CHAINS    Culture MOLD SENT TO STATE FOR IDENTIFICATION  Final   Report Status PENDING  Incomplete  Blood culture (routine x 2)     Status: None (Preliminary result)   Collection Time: 02/01/15  7:43 AM  Result Value Ref Range Status   Specimen Description BLOOD RIGHT ARM  Final   Special Requests   Final    BOTTLES DRAWN AEROBIC AND ANAEROBIC 1 CC ANAERO 4 CC AERO   Culture NO GROWTH 2 DAYS  Final   Report Status PENDING  Incomplete  Urine culture     Status: None   Collection Time: 02/01/15  8:35 AM  Result Value Ref Range Status   Specimen Description URINE, CLEAN CATCH  Final   Special Requests NONE  Final   Culture NO GROWTH 1 DAY  Final   Report Status 02/02/2015 FINAL  Final  MRSA PCR Screening     Status: None   Collection Time: 02/01/15 12:50 PM  Result Value Ref Range Status   MRSA by PCR NEGATIVE NEGATIVE Final    Comment:        The GeneXpert MRSA Assay (FDA approved for NASAL specimens only), is one component of a comprehensive MRSA colonization surveillance  program. It is not intended to diagnose MRSA infection nor to guide or monitor treatment for MRSA infections.   Culture, expectorated sputum-assessment     Status: None   Collection Time: 02/02/15 12:10 PM  Result Value Ref Range Status   Specimen Description INDUCED SPUTUM  Final   Special Requests NONE  Final   Sputum evaluation THIS SPECIMEN IS ACCEPTABLE FOR SPUTUM CULTURE  Final   Report Status 02/02/2015 FINAL  Final  Culture, respiratory (NON-Expectorated)     Status: None (Preliminary result)   Collection Time: 02/02/15 12:10 PM  Result Value Ref Range Status   Specimen Description INDUCED SPUTUM  Final   Special Requests NONE Reflexed from [id number]  Final   Gram Stain   Final     EXCELLENT SPECIMEN - 90-100% WBCS MANY WBC SEEN RARE FUNGAL ELEMENT SEEN    Culture   Final    MOLD PREVIOUS MOLD SENT TO THE STATE LABORATORY FOR IDENTIFICATION LIGHT GROWTH YEAST IDENTIFICATION TO FOLLOW    Report Status PENDING  Incomplete  C difficile quick scan w PCR reflex     Status: None   Collection Time: 02/03/15 10:33 AM  Result Value Ref Range Status   C Diff antigen NEGATIVE NEGATIVE Final   C Diff toxin NEGATIVE NEGATIVE Final   C Diff interpretation Negative for C. difficile  Final    Medical History: Past Medical History  Diagnosis Date  . Seizures     Medications:  Scheduled:  . amiodarone  200 mg Oral Daily  . antiseptic oral rinse  7 mL Mouth Rinse QID  . citalopram  20 mg Oral BH-q7a  . enoxaparin (LOVENOX) injection  40 mg Subcutaneous Q24H  . feeding supplement (ENSURE ENLIVE)  237 mL Oral TID  . folic acid  1 mg Oral Daily  . multivitamin with minerals  1 tablet Oral Daily  . nicotine  21 mg Transdermal Daily  . piperacillin-tazobactam (ZOSYN)  IV  3.375 g Intravenous 3 times per day  . potassium chloride  10 mEq Intravenous Q1 Hr x 6  . potassium phosphate IVPB (mmol)  30 mmol Intravenous Once  . primidone  200 mg Oral QHS  . sodium chloride  3 mL Intravenous Q12H  . thiamine  100 mg Oral Daily  . topiramate  25 mg Oral BID  . vancomycin  750 mg Intravenous Q8H  . voriconazole (VFEND - PT WT </= 85 kg) IV  4 mg/kg Intravenous Q12H   Assessment: Pt is a 59 year old male who presents with SOB, cough, x-ray showing left lobe consolidation with possible pneumonia or tuberculosis. Pt also reports blood-tinged sputum. Patient currently on vancomycin and Zosyn as well as voriconazole with respiratory culture growing mold.   Goal of Therapy:  Vancomycin trough level 15-20 mcg/ml  Plan:  Vancomycin dose increased last pm due to trough level below goal. Will continue current vancomycin and Zosyn doses and check vancomycin trough at steady state this  PM. Will f/u renal funciton and culture results.  Ulice Dash D 02/04/2015,11:53 AM

## 2015-02-04 NOTE — Clinical Documentation Improvement (Signed)
Internal Medicine  Based on the clinical findings below, please document any associated diagnoses/conditions the patient has or may have. Please document findings in next progress note and include in discharge summary if applicable.   Other  Clinically Undetermined  Supporting Information:  Patient is 5'9" tall and weighs 103 pounds; BMI is 15. No mention of muscle or fat loss in Nutritional Consult  Weight loss  Being supplemented with Ensure Enlive TID  Please exercise your independent, professional judgment when responding. A specific answer is not anticipated or expected.  Thank You, Zoila Shutter BSN, Ken Caryl [phone]

## 2015-02-04 NOTE — Progress Notes (Signed)
Pharmacy Consult for Electrolyte Management  No Known Allergies  Patient Measurements: Height: 5\' 9"  (175.3 cm) Weight: 103 lb 9.9 oz (47 kg) IBW/kg (Calculated) : 70.7  Vital Signs: Temp: 97.7 F (36.5 C) (09/23 0820) Temp Source: Oral (09/23 0820) BP: 101/59 mmHg (09/23 0800) Pulse Rate: 93 (09/23 0800) Intake/Output from previous day: 09/22 0701 - 09/23 0700 In: 5529.5 [P.O.:960; I.V.:3313.5; IV Piggyback:1256] Out: 1250 [Urine:1250] Intake/Output from this shift: Total I/O In: 1566.5 [P.O.:584; I.V.:422.5; IV Piggyback:560] Out: 250 [Urine:250]  Labs:  Recent Labs  02/02/15 0348 02/02/15 1221 02/04/15 0559  WBC 31.0* 26.5* 25.6*  HGB 8.3* 8.3* 8.7*  HCT 25.0* 25.7* 26.6*  PLT 485* 532* 538*  APTT  --  50*  --   INR  --  1.26  --      Recent Labs  02/01/15 1319  02/02/15 1221 02/03/15 0511 02/04/15 0559  NA  --   < > 133* 138 139  K  --   < > 2.8* 3.2* 3.0*  CL  --   < > 105 111 111  CO2  --   < > 21* 24 25  GLUCOSE  --   < > 127* 130* 114*  BUN  --   < > 11 11 10   CREATININE  --   < > 0.69 0.67 0.61  CALCIUM  --   < > 7.1* 7.4* 7.6*  MG 1.9  --   --  2.0 2.0  PHOS 3.4  --   --  2.5 2.0*  PROT  --   --  4.7*  --   --   ALBUMIN  --   --  1.5*  --   --   AST  --   --  49*  --   --   ALT  --   --  20  --   --   ALKPHOS  --   --  80  --   --   BILITOT  --   --  0.4  --   --   < > = values in this interval not displayed. Estimated Creatinine Clearance: 67.7 mL/min (by C-G formula based on Cr of 0.61).    Recent Labs  02/01/15 1214  GLUCAP 99    Medical History: Past Medical History  Diagnosis Date  . Seizures     Medications:  Scheduled:  . amiodarone  200 mg Oral Daily  . antiseptic oral rinse  7 mL Mouth Rinse QID  . citalopram  20 mg Oral BH-q7a  . enoxaparin (LOVENOX) injection  40 mg Subcutaneous Q24H  . feeding supplement (ENSURE ENLIVE)  237 mL Oral TID  . folic acid  1 mg Oral Daily  . multivitamin with minerals  1 tablet Oral  Daily  . nicotine  21 mg Transdermal Daily  . piperacillin-tazobactam (ZOSYN)  IV  3.375 g Intravenous 3 times per day  . potassium chloride  10 mEq Intravenous Q1 Hr x 6  . potassium phosphate IVPB (mmol)  30 mmol Intravenous Once  . primidone  200 mg Oral QHS  . sodium chloride  3 mL Intravenous Q12H  . thiamine  100 mg Oral Daily  . topiramate  25 mg Oral BID  . vancomycin  750 mg Intravenous Q8H  . voriconazole (VFEND - PT WT </= 85 kg) IV  4 mg/kg Intravenous Q12H   Infusions:  . sodium chloride 50 mL/hr at 02/04/15 0933  . norepinephrine 0 mcg/min (02/03/15 0437)   PRN: acetaminophen **  OR** acetaminophen, HYDROcodone-acetaminophen, LORazepam, senna-docusate  Assessment: Pharmacy consulted to assist in managing electrolytes in this 59 y/o M with acute respiratory failure due to LUL cavitary PNA.   Plan:  Phos and K are low today despite runs of potassium chloride yesterday. Kphos 30 mmol given once today. Will give an additional 6 runs of KCl 10 meq iv for a total of about 100 meq K today. Will f/u am labs.   Ulice Dash D 02/04/2015,11:48 AM

## 2015-02-04 NOTE — Consult Note (Signed)
Patient ID: Jonathan [name], male   DOB: 21-Dec-1955, 59 y.o.   MRN: [id number] Reason For Consult: Left Upper Lobe Cavitary Lung Lesion HPI Jonathan [name] is a 58 y.o. male admitted to the medicine service for sepsis secondary to left upper lobe pneumonia. Patient has been admitted to the medicine service due to pneumonia and sepsis and has had a remarkable recovery with medical treatment. His primary complaint on my consultation today was of cough. He denies any current chest pain or shortness of breath. He has very little insight into his disease process and appeared confused after talking about his cavitary lung lesion. His primary concern was that he does not have any insurance currently. He has had a recent weight loss and appears cachectic.  HPI  Past Medical History  Diagnosis Date  . Seizures     History reviewed. No pertinent past surgical history.  History reviewed. No pertinent family history.  Social History Social History  Substance Use Topics  . Smoking status: Current Every Day Smoker  . Smokeless tobacco: None  . Alcohol Use: None    No Known Allergies  Current Facility-Administered Medications  Medication Dose Route Frequency Provider Last Rate Last Dose  . 0.9 %  sodium chloride infusion   Intravenous Continuous Laverle Hobby, MD 50 mL/hr at 02/04/15 1646    . acetaminophen (TYLENOL) tablet 650 mg  650 mg Oral Q6H PRN Bettey Costa, MD       Or  . acetaminophen (TYLENOL) suppository 650 mg  650 mg Rectal Q6H PRN Bettey Costa, MD      . amiodarone (PACERONE) tablet 200 mg  200 mg Oral Daily Laverle Hobby, MD   200 mg at 02/04/15 0930  . citalopram (CELEXA) tablet 20 mg  20 mg Oral BH-q7a Bettey Costa, MD   20 mg at 02/04/15 0930  . dextromethorphan (DELSYM) 30 MG/5ML liquid 30 mg  30 mg Oral BID Bettey Costa, MD   30 mg at 02/04/15 1326   And  . guaiFENesin (MUCINEX) 12 hr tablet 600 mg  600 mg Oral BID Bettey Costa, MD   600 mg at 02/04/15 1325  . enoxaparin  (LOVENOX) injection 40 mg  40 mg Subcutaneous Q24H Sital Mody, MD   40 mg at 02/04/15 1326  . feeding supplement (ENSURE ENLIVE) (ENSURE ENLIVE) liquid 237 mL  237 mL Oral TID Bettey Costa, MD   237 mL at 02/04/15 1314  . folic acid (FOLVITE) tablet 1 mg  1 mg Oral Daily Sital Mody, MD   1 mg at 02/04/15 0930  . HYDROcodone-acetaminophen (NORCO/VICODIN) 5-325 MG per tablet 1-2 tablet  1-2 tablet Oral Q4H PRN Bettey Costa, MD      . LORazepam (ATIVAN) injection 1-2 mg  1-2 mg Intravenous Q1H PRN Bettey Costa, MD   2 mg at 02/03/15 0108  . multivitamin with minerals tablet 1 tablet  1 tablet Oral Daily Bettey Costa, MD   1 tablet at 02/04/15 0929  . nicotine (NICODERM CQ - dosed in mg/24 hours) patch 21 mg  21 mg Transdermal Daily Bettey Costa, MD   21 mg at 02/03/15 0935  . piperacillin-tazobactam (ZOSYN) IVPB 3.375 g  3.375 g Intravenous 3 times per day Ramond Dial, RPH   3.375 g at 02/04/15 8144  . potassium chloride 10 mEq in 100 mL IVPB  10 mEq Intravenous Q1 Hr x 6 Sital Mody, MD   10 mEq at 02/04/15 1640  . primidone (MYSOLINE) tablet 200 mg  200 mg  Oral QHS Bettey Costa, MD   200 mg at 02/03/15 2116  . senna-docusate (Senokot-S) tablet 1 tablet  1 tablet Oral QHS PRN Sital Mody, MD      . sodium chloride 0.9 % injection 3 mL  3 mL Intravenous Q12H Sital Mody, MD   3 mL at 02/04/15 0930  . thiamine (VITAMIN B-1) tablet 100 mg  100 mg Oral Daily Sital Mody, MD   100 mg at 02/04/15 0930  . topiramate (TOPAMAX) tablet 25 mg  25 mg Oral BID Bettey Costa, MD   25 mg at 02/04/15 0929  . vancomycin (VANCOCIN) IVPB 750 mg/150 ml premix  750 mg Intravenous Q8H Sital Mody, MD   750 mg at 02/04/15 1326  . voriconazole (VFEND) 190 mg in sodium chloride 0.9 % 100 mL IVPB  4 mg/kg Intravenous Q12H Laverle Hobby, MD   190 mg at 02/04/15 1640     Review of Systems A multi-point review of systems was asked and was negative except for the following positive findings: Cough and weight loss with associated  weakness  Physical Exam Blood pressure 112/60, pulse 100, temperature 98.4 F (36.9 C), temperature source Oral, resp. rate 22, height 5\' 9"  (1.753 m), weight 47 kg (103 lb 9.9 oz), SpO2 95 %. CONSTITUTIONAL: Cachectic-appearing male. EYES: Pupils are equal, round, and reactive to light, Sclera are non-icteric. EARS, NOSE, MOUTH AND THROAT: The oropharynx is clear. The oral mucosa is pink and moist. Hearing is intact to voice. LYMPH NODES:  Lymph nodes in the neck are normal. RESPIRATORY:  Lungs sounds are coarse, worse on the left. There is normal respiratory effort, with equal breath sounds bilaterally, and without pathologic use of accessory muscles. CARDIOVASCULAR: Heart is regular without murmurs, gallops, or rubs. GI: The abdomen is soft, nontender, and nondistended. There are no palpable masses.There are normal bowel sounds in all quadrants. GU: Rectal deferred.   MUSCULOSKELETAL: Normal muscle strength and tone. No edema.   SKIN: Turgor is good and there are no pathologic skin lesions or ulcers. NEUROLOGIC: Motor and sensation is grossly normal. Cranial nerves are grossly intact. PSYCH:  Oriented to person, place and time. Affect is normal.  Data Reviewed Images and labs reviewed. Leukocytosis very slowly decreasing down to 25.6. Chest x-ray and CT scan reviewed showing cavitary left upper lobe lung lesion. This appears to replace near the entirety of the left upper lobe I have personally reviewed the patient's imaging, laboratory findings and medical records.    Assessment    59 year old male with a cavitary left upper lobe infection.    Plan    59 year old male with cavitary lung lesion consistent with fungal infection. Have discussed this case with Dr. Faith Rogue was out of town this week. Prior to contemplating any surgical intervention PFTs could be of benefit to determine whether or not he can tolerate the procedure. Will not currently plan for surgical intervention, however Dr.  Faith Rogue will return on Monday for repeat evaluation.     Time spent with the patient was 30 minutes, with more than 50% of the time spent in face-to-face education, counseling and care coordination.     Clayburn Pert 02/04/2015, 5:33 PM

## 2015-02-04 NOTE — Progress Notes (Signed)
Patient alert and oriented. Vitals stable- NSR on monitor. No complaints of pain. Tolerating diet- Pt to transfer to room 133.

## 2015-02-04 NOTE — Consult Note (Signed)
ANTIBIOTIC CONSULT NOTE - INITIAL  Pharmacy Consult for vancomycin/zosyn Indication: rule out pneumonia and rule out sepsis  No Known Allergies  Patient Measurements: Height: 5\' 9"  (175.3 cm) Weight: 103 lb 9.9 oz (47 kg) IBW/kg (Calculated) : 70.7 Adjusted Body Weight:   Vital Signs: Temp: 99.2 F (37.3 C) (09/23 2054) Temp Source: Oral (09/23 2054) BP: 122/67 mmHg (09/23 2054) Pulse Rate: 119 (09/23 2054) Intake/Output from previous day: 09/22 0701 - 09/23 0700 In: 5529.5 [P.O.:960; I.V.:3313.5; IV Piggyback:1256] Out: 1250 [Urine:1250] Intake/Output from this shift: Total I/O In: -  Out: 400 [Urine:400]  Labs:  Recent Labs  02/02/15 0348 02/02/15 1221 02/03/15 0511 02/04/15 0559  WBC 31.0* 26.5*  --  25.6*  HGB 8.3* 8.3*  --  8.7*  PLT 485* 532*  --  538*  CREATININE 0.71 0.69 0.67 0.61   Estimated Creatinine Clearance: 67.7 mL/min (by C-G formula based on Cr of 0.61).  Recent Labs  02/03/15 1955 02/04/15 1950  [place] [address] 61     Microbiology: Recent Results (from the past 720 hour(s))  Blood culture (routine x 2)     Status: None (Preliminary result)   Collection Time: 02/01/15  7:41 AM  Result Value Ref Range Status   Specimen Description BLOOD LEFT HAND  Final   Special Requests   Final    BOTTLES DRAWN AEROBIC AND ANAEROBIC 2 CC AERO 1 CC ANAERO   Culture NO GROWTH 2 DAYS  Final   Report Status PENDING  Incomplete  Culture, expectorated sputum-assessment     Status: None   Collection Time: 02/01/15  7:42 AM  Result Value Ref Range Status   Specimen Description EXPECTORATED SPUTUM  Final   Special Requests Immunocompromised  Final   Sputum evaluation THIS SPECIMEN IS ACCEPTABLE FOR SPUTUM CULTURE  Final   Report Status 02/01/2015 FINAL  Final  Culture, respiratory (NON-Expectorated)     Status: None (Preliminary result)   Collection Time: 02/01/15  7:42 AM  Result Value Ref Range Status   Specimen Description EXPECTORATED SPUTUM  Final   Special Requests Immunocompromised Reflexed from [id number]  Final   Gram Stain   Final    EXCELLENT SPECIMEN - 90-100% WBCS MANY WBC SEEN FEW GRAM NEGATIVE RODS MANY GRAM POSITIVE COCCI IN CHAINS    Culture MOLD SENT TO STATE FOR IDENTIFICATION  Final   Report Status PENDING  Incomplete  Blood culture (routine x 2)     Status: None (Preliminary result)   Collection Time: 02/01/15  7:43 AM  Result Value Ref Range Status   Specimen Description BLOOD RIGHT ARM  Final   Special Requests   Final    BOTTLES DRAWN AEROBIC AND ANAEROBIC 1 CC ANAERO 4 CC AERO   Culture NO GROWTH 2 DAYS  Final   Report Status PENDING  Incomplete  Urine culture     Status: None   Collection Time: 02/01/15  8:35 AM  Result Value Ref Range Status   Specimen Description URINE, CLEAN CATCH  Final   Special Requests NONE  Final   Culture NO GROWTH 1 DAY  Final   Report Status 02/02/2015 FINAL  Final  MRSA PCR Screening     Status: None   Collection Time: 02/01/15 12:50 PM  Result Value Ref Range Status   MRSA by PCR NEGATIVE NEGATIVE Final    Comment:        The GeneXpert MRSA Assay (FDA approved for NASAL specimens only), is one component of a comprehensive MRSA colonization surveillance program.  It is not intended to diagnose MRSA infection nor to guide or monitor treatment for MRSA infections.   Culture, expectorated sputum-assessment     Status: None   Collection Time: 02/02/15 12:10 PM  Result Value Ref Range Status   Specimen Description INDUCED SPUTUM  Final   Special Requests NONE  Final   Sputum evaluation THIS SPECIMEN IS ACCEPTABLE FOR SPUTUM CULTURE  Final   Report Status 02/02/2015 FINAL  Final  Culture, respiratory (NON-Expectorated)     Status: None (Preliminary result)   Collection Time: 02/02/15 12:10 PM  Result Value Ref Range Status   Specimen Description INDUCED SPUTUM  Final   Special Requests NONE Reflexed from [id number]  Final   Gram Stain   Final    EXCELLENT SPECIMEN - 90-100%  WBCS MANY WBC SEEN RARE FUNGAL ELEMENT SEEN    Culture   Final    MOLD PREVIOUS MOLD SENT TO THE STATE LABORATORY FOR IDENTIFICATION LIGHT GROWTH YEAST IDENTIFICATION TO FOLLOW    Report Status PENDING  Incomplete  C difficile quick scan w PCR reflex     Status: None   Collection Time: 02/03/15 10:33 AM  Result Value Ref Range Status   C Diff antigen NEGATIVE NEGATIVE Final   C Diff toxin NEGATIVE NEGATIVE Final   C Diff interpretation Negative for C. difficile  Final    Medical History: Past Medical History  Diagnosis Date  . Seizures     Medications:  Scheduled:  . amiodarone  200 mg Oral Daily  . citalopram  20 mg Oral BH-q7a  . dextromethorphan  30 mg Oral BID   And  . guaiFENesin  600 mg Oral BID  . enoxaparin (LOVENOX) injection  40 mg Subcutaneous Q24H  . feeding supplement (ENSURE ENLIVE)  237 mL Oral TID  . folic acid  1 mg Oral Daily  . multivitamin with minerals  1 tablet Oral Daily  . nicotine  21 mg Transdermal Daily  . piperacillin-tazobactam (ZOSYN)  IV  3.375 g Intravenous 3 times per day  . potassium chloride  10 mEq Intravenous Q1 Hr x 3  . primidone  200 mg Oral QHS  . sodium chloride  3 mL Intravenous Q12H  . thiamine  100 mg Oral Daily  . topiramate  25 mg Oral BID  . vancomycin  750 mg Intravenous Q8H  . voriconazole (VFEND - PT WT </= 85 kg) IV  4 mg/kg Intravenous Q12H   Assessment: Pt is a 59 year old male who presents with SOB, cough, x-ray showing left lobe consolidation with possible pneumonia or tuberculosis. Pt also reports blood-tinged sputum. Patient currently on vancomycin and Zosyn as well as voriconazole with respiratory culture growing mold.   Goal of Therapy:  Vancomycin trough level 15-20 mcg/ml  Plan:  Vancomycin trough = 17= at goal. Continue vancomycin 750mg  q 8 hours.  Will f/u renal funciton and culture results.  Check another trough atleast weekly  Ramond Dial 02/04/2015,9:24 PM

## 2015-02-04 NOTE — Progress Notes (Signed)
[facility] [place] NAME: Jonathan [name]    MR#:  [id number]  DATE OF BIRTH:  [birth year]  SUBJECTIVE:  Patient's sputum cultures growing out mold and he was started for voriconazole yesterday He continues have a very good cough REVIEW OF SYSTEMS:    Review of Systems  Constitutional: Positive for weight loss. Negative for fever, chills and malaise/fatigue.  HENT: Negative for sore throat.   Eyes: Negative for blurred vision.  Respiratory: Positive for cough. Negative for hemoptysis, shortness of breath and wheezing.   Cardiovascular: Negative for chest pain, palpitations and leg swelling.  Gastrointestinal: Negative for nausea, vomiting, abdominal pain and blood in stool.  Genitourinary: Negative for dysuria.  Musculoskeletal: Negative for back pain.  Neurological: Positive for weakness. Negative for dizziness, tremors and headaches.  Endo/Heme/Allergies: Does not bruise/bleed easily.  Psychiatric/Behavioral: Negative for depression.    Tolerating Diet: Yes      DRUG ALLERGIES:  No Known Allergies  VITALS:  Blood pressure 101/59, pulse 93, temperature 97.7 F (36.5 C), temperature source Oral, resp. rate 25, height 5\' 9"  (1.753 m), weight 47 kg (103 lb 9.9 oz), SpO2 100 %.  PHYSICAL EXAMINATION:   Physical Exam  Constitutional: He is oriented to person, place, and time and well-developed, well-nourished, and in no distress. No distress.  Frail weak  HENT:  Head: Normocephalic.  Eyes: No scleral icterus.  Neck: Normal range of motion. Neck supple. No JVD present. No tracheal deviation present.  Cardiovascular: Regular rhythm and normal heart sounds.  Exam reveals no gallop and no friction rub.   No murmur heard. Tachycardic  Pulmonary/Chest: Effort normal. No respiratory distress. He has wheezes. He has no rales. He exhibits no tenderness.  rhonchii  Abdominal: Soft. Bowel sounds are normal. He exhibits no  distension and no mass. There is no tenderness. There is no rebound and no guarding.  Musculoskeletal: Normal range of motion. He exhibits no edema.  Neurological: He is alert and oriented to person, place, and time.  Skin: Skin is warm. No rash noted. He is not diaphoretic. No erythema.  Psychiatric: Affect and judgment normal.      LABORATORY PANEL:   CBC  Recent Labs Lab 02/04/15 0559  WBC 25.6*  HGB 8.7*  HCT 26.6*  PLT 538*   ------------------------------------------------------------------------------------------------------------------  Chemistries   Recent Labs Lab 02/02/15 1221  02/04/15 0559  NA 133*  < > 139  K 2.8*  < > 3.0*  CL 105  < > 111  CO2 21*  < > 25  GLUCOSE 127*  < > 114*  BUN 11  < > 10  CREATININE 0.69  < > 0.61  CALCIUM 7.1*  < > 7.6*  MG  --   < > 2.0  AST 49*  --   --   ALT 20  --   --   ALKPHOS 80  --   --   BILITOT 0.4  --   --   < > = values in this interval not displayed. ------------------------------------------------------------------------------------------------------------------  Cardiac Enzymes  Recent Labs Lab 02/01/15 1319 02/01/15 2355 02/02/15 1221  TROPONINI 2.59* 0.05* 0.04*   ------------------------------------------------------------------------------------------------------------------  RADIOLOGY:  Dg Chest 1 View  02/04/2015   CLINICAL DATA:  Shortness of breath.  Seizure.  EXAM: CHEST 1 VIEW  COMPARISON:  CT 02/03/2015.  Chest x-ray 02/02/2015.  FINDINGS: Right PICC line in good anatomic position. Large cavitary lesion in the left upper lobe is again noted. Mild infiltrate  in the right upper lobe and left lung base cannot be excluded. Tiny pleural effusions. No pneumothorax. Heart size normal.  IMPRESSION: 1. Persistent unchanged cavitary lesion in the left upper lobe. 2. Mild infiltrate right upper lobe and left lower lobe cannot be excluded. Tiny bilateral pleural effusions. 3. Right PICC line stable  position .   Electronically Signed   By: Marcello Moores  Register   On: 02/04/2015 07:33   Ct Chest W Contrast  02/03/2015   CLINICAL DATA:  59 year old male with acute respiratory failure likely secondary to Left-sided pneumonia. Complicated by septic shock with type II myocardial infarction due to demand ischemia, and atrial fibrillation with rapid ventricular rate.^55mL OMNIPAQUE IOHEXOL 300 MG/ML SOLN.  EXAM: CT CHEST WITH CONTRAST  TECHNIQUE: Multidetector CT imaging of the chest was performed during intravenous contrast administration.  CONTRAST:  39mL OMNIPAQUE IOHEXOL 300 MG/ML  SOLN  COMPARISON:  Chest x-ray 02/02/2015  FINDINGS: Heart: Heart size is normal. Coronary artery calcifications are present. Central line/PICC tip to the lower superior vena cava.  Vascular structures: Pulmonary arteries are grossly normally opacified. Thoracic aorta is normal in appearance.  Mediastinum/thyroid: The visualized portion of the thyroid gland has a normal appearance. Mediastinal lymph nodes are present. Pretracheal lymph node is 0.9 cm. Subcarinal lymph node is 1.2 cm. The small left hilar lymph nodes are present, largest measuring approximate 1.0 cm.  Lungs/Airways: Within the left lung apex new there is a large cavitary lesion containing a rounded mass measuring 2 3.8 x 3.3 cm. 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 There is significant airspace disease surrounding this cavitary lesion, occupying the entire upper lobe. There patchy areas of infiltrate within the left lower lobe. Similar patchy densities identified within the right upper lobe. There are bilateral pleural effusions.  Upper abdomen: Possible low-attenuation lesion within the central portion of the right kidney versus hydronephrosis or parapelvic cyst. This abnormality is incompletely evaluated as it is on the last image of this study. Gallbladder is present and is contracted. There is diffuse body wall edema. Stomach is distended.  Chest wall/osseous  structures: Unremarkable.  IMPRESSION: 1. 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 The cavitary lesion may be the result of fungal infection, TB, or malignancy. 2. Patchy areas of infiltrate within the right upper lobe and left lower lobe. 3. Bilateral pleural effusions. 4. Coronary artery disease. 5. Right-sided central line/PICC to the lower superior vena cava. 6. Small mediastinal and hilar lymph nodes may be reactive. 7. Low-attenuation structure within the central portion of the right kidney is incompletely evaluated. Consider further evaluation with renal ultrasound. 8. These results will be called to the ordering clinician or representative by the Radiologist Assistant, and communication documented in the PACS or zVision Dashboard.   Electronically Signed   By: Nolon Nations M.D.   On: 02/03/2015 19:11   Dg Chest Port 1 View  02/02/2015   CLINICAL DATA:  PICC line placement  EXAM: PORTABLE CHEST - 1 VIEW  COMPARISON:  02/02/2015  FINDINGS: Right PICC line has been placed.  The tip is in the SVC.  Continued dense consolidation in the left upper lobe, unchanged. Heart is normal size. No confluent opacity on the right.  IMPRESSION: Right PICC line tip in the SVC.  No change in the dense consolidation in the left upper lobe.   Electronically Signed   By: Rolm Baptise M.D.   On: 02/02/2015 15:13     ASSESSMENT AND PLAN:   59 year old  male who presents with acute respiratory failure secondary to left upper lobe cavitation with septic shock.  1. Acute hypoxic respiratory failure: This is secondary to left upper lobe cavitation: We are currently ruling out tuberculosis. AFBs are pending. Patient is on broad-spectrum antibiotics including Zosyn and vancomycin. His sputum culture showed mold and therefore was started on voriconazole on September 22. Blood cultures are negative to date. Patient is being followed by infectious disease and  pulmonary. I will consult Dr. Faith Rogue as per Dr. Blane Ohara recommendations to see his surgical removal of the left upper lobe would be beneficial. He will require voriconazole lifelong as per Dr. 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 If AFBs are negative we can DC isolation. Await final bacterial culture and continue her antibiotics to culture results.   2. Septic shock: This is secondary to left upper lobe cavitation. Blood pressure has improved and he is now off levo fed.  3. Atrial fibrillation RVR: This is likely secondary to problem #1 and 2. Patient is on amiodarone drip. Cardiology is following patient consultation. Patient underwent 2-D echocardiogram which showed normal ejection fraction and no major valvular abnormalities and/or endocarditis. Patient is off amiodarone drip and on by mouth amiodarone.  4. Elevated troponin: It is reported that the second troponin is 2.59 and then subsequently 0.05 I believes that there is this probably an error.   5. Hypokalemia: Repleted.  6. Leukocytosis: Likely secondary to pneumonia and cavitary lesion. Improving  7. Diarrhea: C. difficile negative.   Management plans discussed with the patient and he is in agreement.  Patient can be transferred from the ICU.  CODE STATUS: Full    PT consult once patient is off of isolation  TOTAL TIME TAKING CARE OF THIS PATIENT: 30 minutes.     POSSIBLE D/C ??, DEPENDING ON CLINICAL CONDITION.   Maddux First M.D on 02/04/2015 at 12:38 PM  Between 7am to 6pm - Pager - [phone] After 6pm go to [email or web address] - password [email or web address] [facility]  [place] [facility]  Office  [phone]  CC: Primary care physician; No primary care provider on file.

## 2015-02-04 NOTE — Progress Notes (Signed)
Nutrition Follow-up    INTERVENTION:   Meals and Snacks: Cater to patient preferences Medical Food Supplement Therapy: continue Ensure Enlive po TID, each supplement provides 350 kcal and 20 grams of protein   NUTRITION DIAGNOSIS:   Inadequate oral intake related to acute illness as evidenced by percent weight loss. 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 Drinking some Ensure  Last BM:  9/23  Electrolyte and Renal Profile:  Recent Labs Lab 02/01/15 1319  02/02/15 1221 02/03/15 0511 02/04/15 0559  BUN  --   < > 11 11 10   CREATININE  --   < > 0.69 0.67 0.61  NA  --   < > 133* 138 139  K  --   < > 2.8* 3.2* 3.0*  MG 1.9  --   --  2.0 2.0  PHOS 3.4  --   --  2.5 2.0*  < > = values in this interval not displayed. Glucose Profile: No results for input(s): GLUCAP in the last 72 hours.  Meds: NS at 50 ml/hr, MVI, folic acid, KCl  Nutrition Focused Physical Exam:  Unable to complete Nutrition-Focused physical exam at this time as pt remains on airborne isolation   Height:   Ht Readings from Last 1 Encounters:  02/01/15 5\' 9"  (1.753 m)    Weight:   Wt Readings from Last 1 Encounters:  02/01/15 103 lb 9.9 oz (47 kg)    Ideal Body Weight:     BMI:  Body mass index is 15.29 kg/(m^2).  Estimated Nutritional Needs:   Kcal:  BEE 1545 kcals (IF 1.1-1.3, AF 1.3) 2209-2611 kcals/d. Using IBW of 73kg  Protein:  (1.1-1.3 g/kg) 80-95 g/d  Fluid:  (30-15ml/kg) 0802-2336  ml/d  EDUCATION NEEDS:   No education needs identified at this time  [place], [place], LDN [phone] Pager

## 2015-02-05 LAB — CBC
HCT: 25.8 % — ABNORMAL LOW (ref 40.0–52.0)
Hemoglobin: 8.5 g/dL — ABNORMAL LOW (ref 13.0–18.0)
MCH: 30.2 pg (ref 26.0–34.0)
MCHC: 33 g/dL (ref 32.0–36.0)
MCV: 91.3 fL (ref 80.0–100.0)
PLATELETS: 487 10*3/uL — AB (ref 150–440)
RBC: 2.83 MIL/uL — ABNORMAL LOW (ref 4.40–5.90)
RDW: 14.9 % — AB (ref 11.5–14.5)
WBC: 20 10*3/uL — AB (ref 3.8–10.6)

## 2015-02-05 LAB — MAGNESIUM: MAGNESIUM: 1.7 mg/dL (ref 1.7–2.4)

## 2015-02-05 LAB — BASIC METABOLIC PANEL
Anion gap: 7 (ref 5–15)
BUN: 7 mg/dL (ref 6–20)
CALCIUM: 7.5 mg/dL — AB (ref 8.9–10.3)
CO2: 25 mmol/L (ref 22–32)
CREATININE: 0.72 mg/dL (ref 0.61–1.24)
Chloride: 107 mmol/L (ref 101–111)
Glucose, Bld: 88 mg/dL (ref 65–99)
Potassium: 3.1 mmol/L — ABNORMAL LOW (ref 3.5–5.1)
SODIUM: 139 mmol/L (ref 135–145)

## 2015-02-05 LAB — PHOSPHORUS: PHOSPHORUS: 2.2 mg/dL — AB (ref 2.5–4.6)

## 2015-02-05 MED ORDER — POTASSIUM CHLORIDE CRYS ER 20 MEQ PO TBCR
20.0000 meq | EXTENDED_RELEASE_TABLET | ORAL | Status: AC
  Administered 2015-02-05 (×3): 20 meq via ORAL
  Filled 2015-02-05 (×3): qty 1

## 2015-02-05 MED ORDER — POTASSIUM PHOSPHATES 15 MMOLE/5ML IV SOLN
30.0000 mmol | Freq: Once | INTRAVENOUS | Status: AC
  Administered 2015-02-05: 30 mmol via INTRAVENOUS
  Filled 2015-02-05 (×2): qty 10

## 2015-02-05 MED ORDER — POTASSIUM CHLORIDE CRYS ER 20 MEQ PO TBCR: 20.0000 meq | EXTENDED_RELEASE_TABLET | ORAL | Status: DC

## 2015-02-05 NOTE — Progress Notes (Signed)
Pharmacy Consult for Electrolyte Management  No Known Allergies  Patient Measurements: Height: 5\' 9"  (175.3 cm) Weight: 103 lb 9.9 oz (47 kg) IBW/kg (Calculated) : 70.7  Vital Signs: Temp: 98.6 F (37 C) (09/24 0424) Temp Source: Oral (09/24 0024) BP: 102/55 mmHg (09/24 0424) Pulse Rate: 123 (09/24 0424) Intake/Output from previous day: 09/23 0701 - 09/24 0700 In: 3593 [P.O.:820; I.V.:1145; IV Piggyback:1628] Out: 9476 [Urine:1625; Stool:2] Intake/Output from this shift:    Labs:  Recent Labs  02/02/15 1221 02/04/15 0559 02/05/15 0500  WBC 26.5* 25.6* 20.0*  HGB 8.3* 8.7* 8.5*  HCT 25.7* 26.6* 25.8*  PLT 532* 538* 487*  APTT 50*  --   --   INR 1.26  --   --      Recent Labs  02/02/15 1221 02/03/15 0511 02/04/15 0559 02/05/15 0500  NA 133* 138 139 139  K 2.8* 3.2* 3.0* 3.1*  CL 105 111 111 107  CO2 21* 24 25 25   GLUCOSE 127* 130* 114* 88  BUN 11 11 10 7   CREATININE 0.69 0.67 0.61 0.72  CALCIUM 7.1* 7.4* 7.6* 7.5*  MG  --  2.0 2.0 1.7  PHOS  --  2.5 2.0* 2.2*  PROT 4.7*  --   --   --   ALBUMIN 1.5*  --   --   --   AST 49*  --   --   --   ALT 20  --   --   --   ALKPHOS 80  --   --   --   BILITOT 0.4  --   --   --    Estimated Creatinine Clearance: 67.7 mL/min (by C-G formula based on Cr of 0.72).   No results for input(s): GLUCAP in the last 72 hours.  Medical History: Past Medical History  Diagnosis Date  . Seizures     Medications:  Scheduled:  . amiodarone  200 mg Oral Daily  . citalopram  20 mg Oral [id number]  . dextromethorphan  30 mg Oral BID   And  . guaiFENesin  600 mg Oral BID  . enoxaparin (LOVENOX) injection  40 mg Subcutaneous Q24H  . feeding supplement (ENSURE ENLIVE)  237 mL Oral TID  . folic acid  1 mg Oral Daily  . multivitamin with minerals  1 tablet Oral Daily  . nicotine  21 mg Transdermal Daily  . piperacillin-tazobactam (ZOSYN)  IV  3.375 g Intravenous 3 times per day  . potassium chloride  20 mEq Oral 6 times per day   . potassium phosphate IVPB (mmol)  30 mmol Intravenous Once  . primidone  200 mg Oral QHS  . sodium chloride  3 mL Intravenous Q12H  . thiamine  100 mg Oral Daily  . topiramate  25 mg Oral BID  . vancomycin  750 mg Intravenous Q8H  . voriconazole (VFEND - PT WT </= 85 kg) IV  4 mg/kg Intravenous Q12H   Infusions:  . sodium chloride 50 mL/hr at 02/04/15 1646   PRN: acetaminophen **OR** acetaminophen, HYDROcodone-acetaminophen, LORazepam, senna-docusate  Assessment: Pharmacy consulted to assist in managing electrolytes in this 59 y/o M with acute respiratory failure due to LUL cavitary PNA.   Plan:  Phos and K are low today despite runs of potassium chloride yesterday. Kphos 30 mmol given once today. Will give an additional 6 runs of KCl 10 meq iv for a total of about 100 meq K today. Will f/u am labs.   9/24  K+ 3.1, PO4-  2.2. 30 mmol potassium phosphate IV x1 and 60 mEq KCl PO in divided doses ordered. Recheck electrolytes in AM.  McBane,Matthew S 02/05/2015,7:05 AM

## 2015-02-05 NOTE — Progress Notes (Signed)
[facility] [place] NAME: Jonathan [name]    MR#:  [id number]  DATE OF BIRTH:  [birth year]  SUBJECTIVE:  The patient has a lot of cough with sputum. REVIEW OF SYSTEMS:    Review of Systems  Constitutional: Positive for weight loss. Negative for fever, chills and malaise/fatigue.  HENT: Negative for sore throat.   Eyes: Negative for blurred vision.  Respiratory: Positive for cough. Negative for hemoptysis, shortness of breath and wheezing.   Cardiovascular: Negative for chest pain, palpitations and leg swelling.  Gastrointestinal: Negative for nausea, vomiting, abdominal pain and blood in stool.  Genitourinary: Negative for dysuria.  Musculoskeletal: Negative for back pain.  Neurological: Positive for weakness. Negative for dizziness, tremors and headaches.  Endo/Heme/Allergies: Does not bruise/bleed easily.  Psychiatric/Behavioral: Negative for depression.    Tolerating Diet: Yes      DRUG ALLERGIES:  No Known Allergies  VITALS:  Blood pressure 114/58, pulse 107, temperature 98.6 F (37 C), temperature source Oral, resp. rate 18, height 5\' 9"  (1.753 m), weight 47 kg (103 lb 9.9 oz), SpO2 95 %.  PHYSICAL EXAMINATION:   Physical Exam  Constitutional: He is oriented to person, place, and time and well-developed, well-nourished, and in no distress. No distress.  Frail weak  HENT:  Head: Normocephalic.  Eyes: No scleral icterus.  Neck: Normal range of motion. Neck supple. No JVD present. No tracheal deviation present.  Cardiovascular: Regular rhythm and normal heart sounds.  Exam reveals no gallop and no friction rub.   No murmur heard. Tachycardic  Pulmonary/Chest: Effort normal. No respiratory distress. He has no wheezes. He has no rales. He exhibits no tenderness.  rhonchii  Abdominal: Soft. Bowel sounds are normal. He exhibits no distension and no mass. There is no tenderness. There is no rebound and no guarding.   Musculoskeletal: Normal range of motion. He exhibits no edema.  Neurological: He is alert and oriented to person, place, and time.  Skin: Skin is warm. No rash noted. He is not diaphoretic. No erythema.  Psychiatric: Affect and judgment normal.      LABORATORY PANEL:   CBC  Recent Labs Lab 02/05/15 0500  WBC 20.0*  HGB 8.5*  HCT 25.8*  PLT 487*   ------------------------------------------------------------------------------------------------------------------  Chemistries   Recent Labs Lab 02/02/15 1221  02/05/15 0500  NA 133*  < > 139  K 2.8*  < > 3.1*  CL 105  < > 107  CO2 21*  < > 25  GLUCOSE 127*  < > 88  BUN 11  < > 7  CREATININE 0.69  < > 0.72  CALCIUM 7.1*  < > 7.5*  MG  --   < > 1.7  AST 49*  --   --   ALT 20  --   --   ALKPHOS 80  --   --   BILITOT 0.4  --   --   < > = values in this interval not displayed. ------------------------------------------------------------------------------------------------------------------  Cardiac Enzymes  Recent Labs Lab 02/01/15 1319 02/01/15 2355 02/02/15 1221  TROPONINI 2.59* 0.05* 0.04*   ------------------------------------------------------------------------------------------------------------------  RADIOLOGY:  Dg Chest 1 View  02/04/2015   CLINICAL DATA:  Shortness of breath.  Seizure.  EXAM: CHEST 1 VIEW  COMPARISON:  CT 02/03/2015.  Chest x-ray 02/02/2015.  FINDINGS: Right PICC line in good anatomic position. Large cavitary lesion in the left upper lobe is again noted. Mild infiltrate in the right upper lobe and left lung base cannot  be excluded. Tiny pleural effusions. No pneumothorax. Heart size normal.  IMPRESSION: 1. Persistent unchanged cavitary lesion in the left upper lobe. 2. Mild infiltrate right upper lobe and left lower lobe cannot be excluded. Tiny bilateral pleural effusions. 3. Right PICC line stable position .   Electronically Signed   By: Marcello Moores  Register   On: 02/04/2015 07:33   Ct Chest  W Contrast  02/03/2015   CLINICAL DATA:  59 year old male with acute respiratory failure likely secondary to Left-sided pneumonia. Complicated by septic shock with type II myocardial infarction due to demand ischemia, and atrial fibrillation with rapid ventricular rate.^63mL OMNIPAQUE IOHEXOL 300 MG/ML SOLN.  EXAM: CT CHEST WITH CONTRAST  TECHNIQUE: Multidetector CT imaging of the chest was performed during intravenous contrast administration.  CONTRAST:  76mL OMNIPAQUE IOHEXOL 300 MG/ML  SOLN  COMPARISON:  Chest x-ray 02/02/2015  FINDINGS: Heart: Heart size is normal. Coronary artery calcifications are present. Central line/PICC tip to the lower superior vena cava.  Vascular structures: Pulmonary arteries are grossly normally opacified. Thoracic aorta is normal in appearance.  Mediastinum/thyroid: The visualized portion of the thyroid gland has a normal appearance. Mediastinal lymph nodes are present. Pretracheal lymph node is 0.9 cm. Subcarinal lymph node is 1.2 cm. The small left hilar lymph nodes are present, largest measuring approximate 1.0 cm.  Lungs/Airways: Within the left lung apex new there is a large cavitary lesion containing a rounded mass measuring 2 3.8 x 3.3 cm. 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 There is significant airspace disease surrounding this cavitary lesion, occupying the entire upper lobe. There patchy areas of infiltrate within the left lower lobe. Similar patchy densities identified within the right upper lobe. There are bilateral pleural effusions.  Upper abdomen: Possible low-attenuation lesion within the central portion of the right kidney versus hydronephrosis or parapelvic cyst. This abnormality is incompletely evaluated as it is on the last image of this study. Gallbladder is present and is contracted. There is diffuse body wall edema. Stomach is distended.  Chest wall/osseous structures: Unremarkable.  IMPRESSION: 1. 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 The cavitary lesion may be the result of fungal infection, TB, or malignancy. 2. Patchy areas of infiltrate within the right upper lobe and left lower lobe. 3. Bilateral pleural effusions. 4. Coronary artery disease. 5. Right-sided central line/PICC to the lower superior vena cava. 6. Small mediastinal and hilar lymph nodes may be reactive. 7. Low-attenuation structure within the central portion of the right kidney is incompletely evaluated. Consider further evaluation with renal ultrasound. 8. These results will be called to the ordering clinician or representative by the Radiologist Assistant, and communication documented in the PACS or zVision Dashboard.   Electronically Signed   By: Nolon Nations M.D.   On: 02/03/2015 19:11     ASSESSMENT AND PLAN:   59 year old male who presents with acute respiratory failure secondary to left upper lobe cavitation with septic shock.  1. Acute hypoxic respiratory failure: This is secondary to left upper lobe cavitation: We are currently ruling out tuberculosis. AFBs are pending. Continue Zosyn and vancomycin. His sputum culture showed mold and therefore was started on voriconazole on September 22. Blood cultures are negative to date. He will require voriconazole lifelong as per Dr. 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 If AFBs are negative we can DC isolation. Await final bacterial culture and continue her antibiotics to culture results.   Per Dr. Adonis Huguenin, surgeon, Prior to contemplating  any surgical intervention PFTs could be of benefit to determine whether or not he can tolerate the procedure. Will not currently plan for surgical intervention, however Dr. Faith Rogue will return on Monday for repeat evaluation.   2. Septic shock: This is secondary to left upper lobe cavitation.  Improved.  3. Atrial fibrillation RVR: This is likely secondary to problem #1 and 2. 2-D  echocardiogram which showed normal ejection fraction and no major valvular abnormalities and/or endocarditis. Patient is off amiodarone drip and on by mouth amiodarone.  4. Elevated troponin: It is reported that the second troponin is 2.59 and then subsequently 0.05 I believes that there is this probably an error.   5. Hypokalemia: Repleted and follow-up BMP. 6. Leukocytosis: Likely secondary to pneumonia and cavitary lesion. Improving  7. Diarrhea: C. difficile negative.   Malnutrition. BMI 15. Encourage oral intake and Follow-up dietitian.  Management plans discussed with the patient and he is in agreement.   CODE STATUS: Full    PT consult once patient is off of isolation   TOTAL TIME TAKING CARE OF THIS PATIENT: 38 minutes.     POSSIBLE D/C ??, DEPENDING ON CLINICAL CONDITION.   Demetrios Loll M.D on 02/05/2015 at 1:20 PM  Between 7am to 6pm - Pager - [phone] After 6pm go to [email or web address] - password [email or web address] [facility]  [place] [facility]  Office  [phone]  CC: Primary care physician; No primary care provider on file.

## 2015-02-06 LAB — CULTURE, BLOOD (ROUTINE X 2)
CULTURE: NO GROWTH
Culture: NO GROWTH

## 2015-02-06 LAB — CBC
HEMATOCRIT: 23.4 % — AB (ref 40.0–52.0)
Hemoglobin: 7.6 g/dL — ABNORMAL LOW (ref 13.0–18.0)
MCH: 29.5 pg (ref 26.0–34.0)
MCHC: 32.2 g/dL (ref 32.0–36.0)
MCV: 91.4 fL (ref 80.0–100.0)
Platelets: 384 10*3/uL (ref 150–440)
RBC: 2.56 MIL/uL — AB (ref 4.40–5.90)
RDW: 15 % — ABNORMAL HIGH (ref 11.5–14.5)
WBC: 17.9 10*3/uL — AB (ref 3.8–10.6)

## 2015-02-06 LAB — PHOSPHORUS: PHOSPHORUS: 2.9 mg/dL (ref 2.5–4.6)

## 2015-02-06 LAB — BASIC METABOLIC PANEL
ANION GAP: 6 (ref 5–15)
BUN: 8 mg/dL (ref 6–20)
CHLORIDE: 108 mmol/L (ref 101–111)
CO2: 24 mmol/L (ref 22–32)
Calcium: 7.2 mg/dL — ABNORMAL LOW (ref 8.9–10.3)
Creatinine, Ser: 0.75 mg/dL (ref 0.61–1.24)
GFR calc Af Amer: >60 mL/min (ref >60)
GFR calc non Af Amer: >60 mL/min (ref >60)
GLUCOSE: 100 mg/dL — AB (ref 65–99)
POTASSIUM: 2.9 mmol/L — AB (ref 3.5–5.1)
Sodium: 138 mmol/L (ref 135–145)

## 2015-02-06 LAB — MAGNESIUM: Magnesium: 1.8 mg/dL (ref 1.7–2.4)

## 2015-02-06 MED ORDER — POTASSIUM CHLORIDE CRYS ER 20 MEQ PO TBCR
40.0000 meq | EXTENDED_RELEASE_TABLET | Freq: Once | ORAL | Status: AC
  Administered 2015-02-06: 40 meq via ORAL
  Filled 2015-02-06: qty 2

## 2015-02-06 MED ORDER — POTASSIUM CHLORIDE CRYS ER 20 MEQ PO TBCR
20.0000 meq | EXTENDED_RELEASE_TABLET | Freq: Three times a day (TID) | ORAL | Status: DC
  Administered 2015-02-06 – 2015-02-10 (×14): 20 meq via ORAL
  Filled 2015-02-06 (×14): qty 1

## 2015-02-06 NOTE — Progress Notes (Signed)
Spoke with Dr Bridgett Larsson concerning pt having 5 sec pause on monitor , no change in orders, acknowledged

## 2015-02-06 NOTE — Progress Notes (Signed)
Critical potassium 2.9 called to [facility] doc Dr. Marcille Blanco. Will review for possible new orders.

## 2015-02-06 NOTE — Progress Notes (Signed)
[facility] [place] NAME: Jonathan [name]    MR#:  [id number]  DATE OF BIRTH:  01-31-1956  SUBJECTIVE:  The patient still has a lot of cough with sputum and generalized weakness. REVIEW OF SYSTEMS:    Review of Systems  Constitutional: Positive for weight loss. Negative for fever, chills and malaise/fatigue.  HENT: Negative for sore throat.   Eyes: Negative for blurred vision.  Respiratory: Positive for cough. Negative for hemoptysis, shortness of breath and wheezing.   Cardiovascular: Negative for chest pain, palpitations and leg swelling.  Gastrointestinal: Negative for nausea, vomiting, abdominal pain and blood in stool.  Genitourinary: Negative for dysuria.  Musculoskeletal: Negative for back pain.  Neurological: Positive for weakness. Negative for dizziness, tremors and headaches.  Endo/Heme/Allergies: Does not bruise/bleed easily.  Psychiatric/Behavioral: Negative for depression.    Tolerating Diet: Yes      DRUG ALLERGIES:  No Known Allergies  VITALS:  Blood pressure 97/57, pulse 104, temperature 98.9 F (37.2 C), temperature source Oral, resp. rate 18, height 5\' 9"  (1.753 m), weight 47 kg (103 lb 9.9 oz), SpO2 94 %.  PHYSICAL EXAMINATION:   Physical Exam  Constitutional: He is oriented to person, place, and time and well-developed, well-nourished, and in no distress. No distress.  Frail weak  HENT:  Head: Normocephalic.  Eyes: No scleral icterus.  Neck: Normal range of motion. Neck supple. No JVD present. No tracheal deviation present.  Cardiovascular: Regular rhythm and normal heart sounds.  Exam reveals no gallop and no friction rub.   No murmur heard. Tachycardic  Pulmonary/Chest: Effort normal. No respiratory distress. He has no wheezes. He has no rales. He exhibits no tenderness.  rhonchii  Abdominal: Soft. Bowel sounds are normal. He exhibits no distension and no mass. There is no tenderness. There is  no rebound and no guarding.  Musculoskeletal: Normal range of motion. He exhibits no edema.  Neurological: He is alert and oriented to person, place, and time.  Skin: Skin is warm. No rash noted. He is not diaphoretic. No erythema.  Psychiatric: Affect and judgment normal.   The patient has moderate to severe bilateral rhonchii and crackles in the lungs.   LABORATORY PANEL:   CBC  Recent Labs Lab 02/06/15 0830  WBC 17.9*  HGB 7.6*  HCT 23.4*  PLT 384   ------------------------------------------------------------------------------------------------------------------  Chemistries   Recent Labs Lab 02/02/15 1221  02/06/15 0517  NA 133*  < > 138  K 2.8*  < > 2.9*  CL 105  < > 108  CO2 21*  < > 24  GLUCOSE 127*  < > 100*  BUN 11  < > 8  CREATININE 0.69  < > 0.75  CALCIUM 7.1*  < > 7.2*  MG  --   < > 1.8  AST 49*  --   --   ALT 20  --   --   ALKPHOS 80  --   --   BILITOT 0.4  --   --   < > = values in this interval not displayed. ------------------------------------------------------------------------------------------------------------------  Cardiac Enzymes  Recent Labs Lab 02/01/15 1319 02/01/15 2355 02/02/15 1221  TROPONINI 2.59* 0.05* 0.04*   ------------------------------------------------------------------------------------------------------------------  RADIOLOGY:  No results found.   ASSESSMENT AND PLAN:   59 year old male who presents with acute respiratory failure secondary to left upper lobe cavitation with septic shock.  1. Acute hypoxic respiratory failure: This is secondary to left upper lobe cavitation fungi infection: We are currently  ruling out tuberculosis. AFBs are pending. Continue Zosyn and vancomycin. His sputum culture showed mold and therefore was started on voriconazole on September 22. Blood cultures are negative to date. He will require voriconazole lifelong as per Dr. 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 If AFBs are negative we can DC isolation.   Per Dr. Adonis Huguenin, surgeon, Prior to contemplating any surgical intervention PFTs could be of benefit to determine whether or not he can tolerate the procedure. Will not currently plan for surgical intervention, however Dr. Faith Rogue will return on Monday for repeat evaluation.   2. Septic shock: This is secondary to left upper lobe cavitation.  Improved.  3. Atrial fibrillation RVR: This is likely secondary to problem #1 and 2. 2-D echocardiogram which showed normal ejection fraction and no major valvular abnormalities and/or endocarditis. Patient is off amiodarone drip and on by mouth amiodarone. The patient had 5 sec pause on telemetry monitor, Dr. Nehemiah Massed was informed for follow-up.  4. Elevated troponin: It is reported that the second troponin is 2.59 and then subsequently 0.05 I believes that there is this probably an error.   5. Hypokalemia: Potassium is still low, Give potassium supplement and follow-up BMP. Magnesium level is normal. 6. Leukocytosis: Likely secondary to pneumonia and cavitary lesion. Improving, follow-up CBC.  7. Diarrhea: C. difficile negative.   Anemia of chronic disease. Hemoglobin is 7.6 today. Follow-up CBC.  Malnutrition. BMI 15. Encourage oral intake and Follow-up dietitian.  Management plans discussed with the patient and he is in agreement.   CODE STATUS: Full    PT consult once patient is off of isolation   TOTAL TIME TAKING CARE OF THIS PATIENT: 38 minutes.     POSSIBLE D/C ??, DEPENDING ON CLINICAL CONDITION.   Demetrios Loll M.D on 02/06/2015 at 12:17 PM  Between 7am to 6pm - Pager - [phone] After 6pm go to [email or web address] - password [email or web address] [facility]  [facility]  Office  [phone]  CC: Primary care physician; No primary care provider on file.

## 2015-02-06 NOTE — Progress Notes (Signed)
[facility] Encounter Note  Patient: Jonathan [name] / Admit Date: 02/01/2015 / Date of Encounter: 02/06/2015, 6:59 PM   Subjective: Patient is continuing to have some cough and congestion due to significant pneumonia relatively stable at this time No hemodynamic changes and patient is currently with controlled heart rate with a long 5 second pause without symptoms consistent with some sick sinus syndrome  Review of Systems: Positive for: Cough and congestion Negative for: Vision change, hearing change, syncope, dizziness, nausea, vomiting,diarrhea, bloody stool, stomach pain,  diaphoresis, urinary frequency, urinary pain,skin lesions, skin rashes Others previously listed  Objective: Telemetry: Controlled ventricular rate but appears to be normal sinus rhythm today Physical Exam: Blood pressure 100/63, pulse 106, temperature 98.2 F (36.8 C), temperature source Oral, resp. rate 16, height 5\' 9"  (1.753 m), weight 103 lb 9.9 oz (47 kg), SpO2 97 %. Body mass index is 15.29 kg/(m^2). General: Well developed, well nourished, in no acute distress. Head: Normocephalic, atraumatic, sclera non-icteric, no xanthomas, nares are without discharge. Neck: No apparent masses Lungs: Normal respirations with few wheezes, diffuse rhonchi, no rales , basilar crackles   Heart: Regular rate and rhythm, normal S1 S2, no murmur, no rub, no gallop, PMI is normal size and placement, carotid upstroke normal without bruit, jugular venous pressure normal Abdomen: Soft, non-tender, non-distended with normoactive bowel sounds. No hepatosplenomegaly. Abdominal aorta is normal size without bruit Extremities: Trace edema, no clubbing, no cyanosis, no ulcers,  Peripheral: 2+ radial, 2+ femoral, 2+ dorsal pedal pulses Neuro: Alert and oriented. Moves all extremities spontaneously. Psych:  Responds to questions appropriately with a normal affect.   Intake/Output Summary (Last 24 hours) at 02/06/15  1859 Last data filed at 02/06/15 1845  Gross per 24 hour  Intake    730 ml  Output    200 ml  Net    530 ml    Inpatient Medications:  . amiodarone  200 mg Oral Daily  . citalopram  20 mg Oral BH-q7a  . dextromethorphan  30 mg Oral BID   And  . guaiFENesin  600 mg Oral BID  . enoxaparin (LOVENOX) injection  40 mg Subcutaneous Q24H  . feeding supplement (ENSURE ENLIVE)  237 mL Oral TID  . folic acid  1 mg Oral Daily  . multivitamin with minerals  1 tablet Oral Daily  . nicotine  21 mg Transdermal Daily  . piperacillin-tazobactam (ZOSYN)  IV  3.375 g Intravenous 3 times per day  . potassium chloride  20 mEq Oral TID WC  . primidone  200 mg Oral QHS  . sodium chloride  3 mL Intravenous Q12H  . thiamine  100 mg Oral Daily  . topiramate  25 mg Oral BID  . vancomycin  750 mg Intravenous Q8H  . voriconazole (VFEND - PT WT </= 85 kg) IV  4 mg/kg Intravenous Q12H   Infusions:  . sodium chloride 50 mL/hr at 02/06/15 4854    Labs:  Recent Labs  02/05/15 0500 02/06/15 0517  NA 139 138  K 3.1* 2.9*  CL 107 108  CO2 25 24  GLUCOSE 88 100*  BUN 7 8  CREATININE 0.72 0.75  CALCIUM 7.5* 7.2*  MG 1.7 1.8  PHOS 2.2* 2.9   No results for input(s): AST, ALT, ALKPHOS, BILITOT, PROT, ALBUMIN in the last 72 hours.  Recent Labs  02/05/15 0500 02/06/15 0830  WBC 20.0* 17.9*  HGB 8.5* 7.6*  HCT 25.8* 23.4*  MCV 91.3 91.4  PLT 487* 384  No results for input(s): CKTOTAL, CKMB, TROPONINI in the last 72 hours. Invalid input(s): POCBNP No results for input(s): HGBA1C in the last 72 hours.   Weights: Filed Weights   02/01/15 0719 02/01/15 1220  Weight: 114 lb (51.71 kg) 103 lb 9.9 oz (47 kg)     Radiology/Studies:  Dg Chest 1 View  02/04/2015   CLINICAL DATA:  Shortness of breath.  Seizure.  EXAM: CHEST 1 VIEW  COMPARISON:  CT 02/03/2015.  Chest x-ray 02/02/2015.  FINDINGS: Right PICC line in good anatomic position. Large cavitary lesion in the left upper lobe is again noted.  Mild infiltrate in the right upper lobe and left lung base cannot be excluded. Tiny pleural effusions. No pneumothorax. Heart size normal.  IMPRESSION: 1. Persistent unchanged cavitary lesion in the left upper lobe. 2. Mild infiltrate right upper lobe and left lower lobe cannot be excluded. Tiny bilateral pleural effusions. 3. Right PICC line stable position .   Electronically Signed   By: Marcello Moores  Register   On: 02/04/2015 07:33   Ct Chest W Contrast  02/03/2015   CLINICAL DATA:  59 year old male with acute respiratory failure likely secondary to Left-sided pneumonia. Complicated by septic shock with type II myocardial infarction due to demand ischemia, and atrial fibrillation with rapid ventricular rate.^40mL OMNIPAQUE IOHEXOL 300 MG/ML SOLN.  EXAM: CT CHEST WITH CONTRAST  TECHNIQUE: Multidetector CT imaging of the chest was performed during intravenous contrast administration.  CONTRAST:  13mL OMNIPAQUE IOHEXOL 300 MG/ML  SOLN  COMPARISON:  Chest x-ray 02/02/2015  FINDINGS: Heart: Heart size is normal. Coronary artery calcifications are present. Central line/PICC tip to the lower superior vena cava.  Vascular structures: Pulmonary arteries are grossly normally opacified. Thoracic aorta is normal in appearance.  Mediastinum/thyroid: The visualized portion of the thyroid gland has a normal appearance. Mediastinal lymph nodes are present. Pretracheal lymph node is 0.9 cm. Subcarinal lymph node is 1.2 cm. The small left hilar lymph nodes are present, largest measuring approximate 1.0 cm.  Lungs/Airways: Within the left lung apex new there is a large cavitary lesion containing a rounded mass measuring 2 3.8 x 3.3 cm. 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 There is significant airspace disease surrounding this cavitary lesion, occupying the entire upper lobe. There patchy areas of infiltrate within the left lower lobe. Similar patchy densities identified within the right upper lobe. There are bilateral pleural  effusions.  Upper abdomen: Possible low-attenuation lesion within the central portion of the right kidney versus hydronephrosis or parapelvic cyst. This abnormality is incompletely evaluated as it is on the last image of this study. Gallbladder is present and is contracted. There is diffuse body wall edema. Stomach is distended.  Chest wall/osseous structures: Unremarkable.  IMPRESSION: 1. 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 The cavitary lesion may be the result of fungal infection, TB, or malignancy. 2. Patchy areas of infiltrate within the right upper lobe and left lower lobe. 3. Bilateral pleural effusions. 4. Coronary artery disease. 5. Right-sided central line/PICC to the lower superior vena cava. 6. Small mediastinal and hilar lymph nodes may be reactive. 7. Low-attenuation structure within the central portion of the right kidney is incompletely evaluated. Consider further evaluation with renal ultrasound. 8. These results will be called to the ordering clinician or representative by the Radiologist Assistant, and communication documented in the PACS or zVision Dashboard.   Electronically Signed   By: Nolon Nations M.D.   On: 02/03/2015 19:11   Dg Chest  Port 1 View  02/02/2015   CLINICAL DATA:  PICC line placement  EXAM: PORTABLE CHEST - 1 VIEW  COMPARISON:  02/02/2015  FINDINGS: Right PICC line has been placed.  The tip is in the SVC.  Continued dense consolidation in the left upper lobe, unchanged. Heart is normal size. No confluent opacity on the right.  IMPRESSION: Right PICC line tip in the SVC.  No change in the dense consolidation in the left upper lobe.   Electronically Signed   By: Rolm Baptise M.D.   On: 02/02/2015 15:13   Dg Chest Port 1 View  02/02/2015   CLINICAL DATA:  Pneumonia. Shortness of breath. Weight loss. Nights with  EXAM: PORTABLE CHEST - 1 VIEW  COMPARISON:  02/01/2015  FINDINGS: Dense consolidation again  noted in the upper lobe, unchanged. Questionable central cavitation. No confluent opacity on the right. Heart is normal size. No visible effusions.  IMPRESSION: Stable dense consolidation in the left upper lobe with questionable areas of central cavitation.   Electronically Signed   By: Rolm Baptise M.D.   On: 02/02/2015 11:07   Dg Chest Portable 1 View  02/01/2015   CLINICAL DATA:  Productive cough, shortness of breath, assess for TB  EXAM: PORTABLE CHEST - 1 VIEW  COMPARISON:  None.  FINDINGS: There is consolidation of the left upper lobe with cavitation. The right lung is clear. There is no pleural effusion bilaterally. The mediastinal contour and cardiac silhouette are normal. The bony structures are normal.  IMPRESSION: Consolidation of the left upper lobe with cavitation. The findings are likely infectious in nature, TB is not excluded based on the x-ray.   Electronically Signed   By: Abelardo Diesel M.D.   On: 02/01/2015 09:15     Assessment and Recommendation  59 y.o. male with acute left lower lobe pneumonia with possible cavitation and atrial fibrillation nonvalvular in nature now controlled with amiodarone at appropriate dosage the remaining mainly in normal sinus rhythm with one long pause while eating dinner asymptomatic in nature less than previous causes. This may be consistent with sick sinus syndrome and may need further long-term evaluation after patient continues to have ambulate after full treatment of pneumonia as to whether pacemaker placement may be necessary. During treatment of infection would not place a pacemaker due to concerns of infection of the pacemaker therefore will watch closely for any symptomatic evidence of atrial fibrillation and/or long pauses with adjustments of medication dosages 1. Continue amiodarone at current dosage for maintenance of normal sinus rhythm 2. No anticoagulation at this time due to concerns of bleeding risk and treatment of her current infection 3.  Begin ambulation and follow for any further episodes of long pauses and/or sick sinus syndrome 4. No further cardiac diagnostics today  Signed, Serafina Royals M.D. FACC

## 2015-02-06 NOTE — Progress Notes (Signed)
Dr Nehemiah Massed notified of pt having 5 sec pause, and request that he follow per Dr Bridgett Larsson

## 2015-02-06 NOTE — Progress Notes (Signed)
Monitor tech called reported pt had 5 sec pause on telemetry, pt was asymptomatic vital signs unchanged , pt was eating and taking po meds at the time nurse present , pt potassium 2.9  Po potassium given will notify Dr Bridgett Larsson

## 2015-02-06 NOTE — Progress Notes (Signed)
Pharmacy Consult for Electrolyte Management  No Known Allergies  Patient Measurements: Height: 5\' 9"  (175.3 cm) Weight: 103 lb 9.9 oz (47 kg) IBW/kg (Calculated) : 70.7  Vital Signs: Temp: 98.7 F (37.1 C) (09/25 0559) Temp Source: Oral (09/25 0559) BP: 104/59 mmHg (09/25 0559) Pulse Rate: 107 (09/25 0559) Intake/Output from previous day: 09/24 0701 - 09/25 0700 In: -  Out: 425 [Urine:425] Intake/Output from this shift:    Labs:  Recent Labs  02/04/15 0559 02/05/15 0500  WBC 25.6* 20.0*  HGB 8.7* 8.5*  HCT 26.6* 25.8*  PLT 538* 487*     Recent Labs  02/04/15 0559 02/05/15 0500 02/06/15 0517  NA 139 139 138  K 3.0* 3.1* 2.9*  CL 111 107 108  CO2 25 25 24   GLUCOSE 114* 88 100*  BUN 10 7 8   CREATININE 0.61 0.72 0.75  CALCIUM 7.6* 7.5* 7.2*  MG 2.0 1.7 1.8  PHOS 2.0* 2.2* 2.9   Estimated Creatinine Clearance: 67.7 mL/min (by C-G formula based on Cr of 0.75).   No results for input(s): GLUCAP in the last 72 hours.  Medical History: Past Medical History  Diagnosis Date  . Seizures     Medications:  Scheduled:  . amiodarone  200 mg Oral Daily  . citalopram  20 mg Oral BH-q7a  . dextromethorphan  30 mg Oral BID   And  . guaiFENesin  600 mg Oral BID  . enoxaparin (LOVENOX) injection  40 mg Subcutaneous Q24H  . feeding supplement (ENSURE ENLIVE)  237 mL Oral TID  . folic acid  1 mg Oral Daily  . multivitamin with minerals  1 tablet Oral Daily  . nicotine  21 mg Transdermal Daily  . piperacillin-tazobactam (ZOSYN)  IV  3.375 g Intravenous 3 times per day  . potassium chloride  20 mEq Oral TID WC  . primidone  200 mg Oral QHS  . sodium chloride  3 mL Intravenous Q12H  . thiamine  100 mg Oral Daily  . topiramate  25 mg Oral BID  . vancomycin  750 mg Intravenous Q8H  . voriconazole (VFEND - PT WT </= 85 kg) IV  4 mg/kg Intravenous Q12H   Infusions:  . sodium chloride 50 mL/hr at 02/06/15 7014   PRN: acetaminophen **OR** acetaminophen,  HYDROcodone-acetaminophen, LORazepam, senna-docusate  Assessment: Pharmacy consulted to assist in managing electrolytes in this 59 y/o M with acute respiratory failure due to LUL cavitary PNA.   Plan:  Phos and K are low today despite runs of potassium chloride yesterday. Kphos 30 mmol given once today. Will give an additional 6 runs of KCl 10 meq iv for a total of about 100 meq K today. Will f/u am labs.   9/24  K+ 3.1, PO4- 2.2. 30 mmol potassium phosphate IV x1 and 60 mEq KCl PO in divided doses ordered. Recheck electrolytes in AM.  9/25 AM K+ 2.9. 20 mEq PO TID with meals ordered. BMP in AM.  McBane,Matthew S 02/06/2015,7:02 AM

## 2015-02-07 LAB — BASIC METABOLIC PANEL
Anion gap: 5 (ref 5–15)
BUN: 8 mg/dL (ref 6–20)
CHLORIDE: 110 mmol/L (ref 101–111)
CO2: 24 mmol/L (ref 22–32)
CREATININE: 0.77 mg/dL (ref 0.61–1.24)
Calcium: 7.2 mg/dL — ABNORMAL LOW (ref 8.9–10.3)
GFR calc Af Amer: >60 mL/min (ref >60)
GFR calc non Af Amer: >60 mL/min (ref >60)
GLUCOSE: 103 mg/dL — AB (ref 65–99)
Potassium: 3.2 mmol/L — ABNORMAL LOW (ref 3.5–5.1)
Sodium: 139 mmol/L (ref 135–145)

## 2015-02-07 LAB — CBC
HCT: 24.3 % — ABNORMAL LOW (ref 40.0–52.0)
Hemoglobin: 8 g/dL — ABNORMAL LOW (ref 13.0–18.0)
MCH: 29.9 pg (ref 26.0–34.0)
MCHC: 32.9 g/dL (ref 32.0–36.0)
MCV: 90.9 fL (ref 80.0–100.0)
Platelets: 475 10*3/uL — ABNORMAL HIGH (ref 150–440)
RBC: 2.67 MIL/uL — AB (ref 4.40–5.90)
RDW: 14.7 % — ABNORMAL HIGH (ref 11.5–14.5)
WBC: 16.9 10*3/uL — ABNORMAL HIGH (ref 3.8–10.6)

## 2015-02-07 NOTE — Progress Notes (Signed)
Pts AFB smears are negative per Dr Ola Spurr via the [facility] lab. Isolation has been discontinued.

## 2015-02-07 NOTE — Progress Notes (Signed)
[facility] NAME: Jonathan [name]    MR#:  [id number]  DATE OF BIRTH:  06-20-1955  SUBJECTIVE:  better cough with sputum, good appetite, generalized weakness. REVIEW OF SYSTEMS:    Review of Systems  Constitutional: Positive for malaise/fatigue. Negative for fever, chills and weight loss.  HENT: Negative for sore throat.   Eyes: Negative for blurred vision.  Respiratory: Positive for cough. Negative for hemoptysis, shortness of breath and wheezing.   Cardiovascular: Negative for chest pain, palpitations and leg swelling.  Gastrointestinal: Negative for nausea, vomiting, abdominal pain and blood in stool.  Genitourinary: Negative for dysuria.  Musculoskeletal: Negative for back pain.  Neurological: Positive for weakness. Negative for dizziness, tremors and headaches.  Endo/Heme/Allergies: Does not bruise/bleed easily.  Psychiatric/Behavioral: Negative for depression.    Tolerating Diet: Yes      DRUG ALLERGIES:  No Known Allergies  VITALS:  Blood pressure 110/60, pulse 100, temperature 98.2 F (36.8 C), temperature source Oral, resp. rate 18, height 5\' 9"  (1.753 m), weight 47 kg (103 lb 9.9 oz), SpO2 96 %.  PHYSICAL EXAMINATION:   Physical Exam  Constitutional: He is oriented to person, place, and time and well-developed, well-nourished, and in no distress. No distress.  Frail weak  HENT:  Head: Normocephalic.  Eyes: No scleral icterus.  Neck: Normal range of motion. Neck supple. No JVD present. No tracheal deviation present.  Cardiovascular: Regular rhythm and normal heart sounds.  Exam reveals no gallop and no friction rub.   No murmur heard. Tachycardic  Pulmonary/Chest: Effort normal. No respiratory distress. He has no wheezes. He has no rales. He exhibits no tenderness.  rhonchii  Abdominal: Soft. Bowel sounds are normal. He exhibits no distension and no mass. There is no tenderness. There is no rebound  and no guarding.  Musculoskeletal: Normal range of motion. He exhibits no edema.  Neurological: He is alert and oriented to person, place, and time.  Skin: Skin is warm. No rash noted. He is not diaphoretic. No erythema.  Psychiatric: Affect and judgment normal.   The patient has mild to moderate bilateral rhonchii in the lungs.   LABORATORY PANEL:   CBC  Recent Labs Lab 02/07/15 0441  WBC 16.9*  HGB 8.0*  HCT 24.3*  PLT 475*   ------------------------------------------------------------------------------------------------------------------  Chemistries   Recent Labs Lab 02/02/15 1221  02/06/15 0517 02/07/15 0441  NA 133*  < > 138 139  K 2.8*  < > 2.9* 3.2*  CL 105  < > 108 110  CO2 21*  < > 24 24  GLUCOSE 127*  < > 100* 103*  BUN 11  < > 8 8  CREATININE 0.69  < > 0.75 0.77  CALCIUM 7.1*  < > 7.2* 7.2*  MG  --   < > 1.8  --   AST 49*  --   --   --   ALT 20  --   --   --   ALKPHOS 80  --   --   --   BILITOT 0.4  --   --   --   < > = values in this interval not displayed. ------------------------------------------------------------------------------------------------------------------  Cardiac Enzymes  Recent Labs Lab 02/01/15 1319 02/01/15 2355 02/02/15 1221  TROPONINI 2.59* 0.05* 0.04*   ------------------------------------------------------------------------------------------------------------------  RADIOLOGY:  No results found.   ASSESSMENT AND PLAN:   59 year old male who presents with acute respiratory failure secondary to left upper lobe cavitation with septic shock.  1. Acute hypoxic respiratory failure: This is secondary to left upper lobe cavitation fungi infection: We are currently ruling out tuberculosis. AFBs are still pending. Continue Zosyn and vancomycin. His sputum culture showed mold and therefore was started on voriconazole on September 22. Blood cultures are negative to date. He will require voriconazole lifelong as per Dr.  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 Repeat a chest x-ray tomorrow.   Per Dr. Adonis Huguenin, surgeon, Prior to contemplating any surgical intervention PFTs could be of benefit to determine whether or not he can tolerate the procedure. Will not currently plan for surgical intervention, however Dr. Faith Rogue will see the patient today.   2. Septic shock: This is secondary to left upper lobe cavitation.  Improved.  3. Atrial fibrillation RVR: This is likely secondary to problem #1 and 2. 2-D echocardiogram which showed normal ejection fraction and no major valvular abnormalities and/or endocarditis. Patient is off amiodarone drip and on by mouth amiodarone. Per Dr. Nehemiah Massed, continue current treatment.  4. Elevated troponin: It is reported that the second troponin is 2.59 and then subsequently 0.05 I believes that there is this probably an error.   5. Hypokalemia: Potassium is better, Give potassium supplement and follow-up BMP. Magnesium level is normal. 6. Leukocytosis: Likely secondary to pneumonia and cavitary lesion. Improving, follow-up CBC.  7. Diarrhea: C. difficile negative.   Anemia of chronic disease. Hemoglobin is 8.0 today. Follow-up CBC.  Malnutrition. BMI 15. Encourage oral intake and Follow-up dietitian.  Management plans discussed with the patient and he is in agreement.   CODE STATUS: Full    PT evaluation.   TOTAL TIME TAKING CARE OF THIS PATIENT: 38 minutes.     POSSIBLE D/C ??, DEPENDING ON CLINICAL CONDITION.   Demetrios Loll M.D on 02/07/2015 at 2:14 PM  Between 7am to 6pm - Pager - [phone] After 6pm go to [email or web address] - password [email or web address] [facility]  [facility]  Office  [phone]  CC: Primary care physician; No primary care provider on file.

## 2015-02-07 NOTE — Consult Note (Signed)
ANTIBIOTIC CONSULT NOTE  Pharmacy Consult for vancomycin/zosyn Indication: rule out pneumonia and rule out sepsis  No Known Allergies  Patient Measurements: Height: 5\' 9"  (175.3 cm) Weight: 103 lb 9.9 oz (47 kg) IBW/kg (Calculated) : 70.7  Vital Signs: Temp: 98.2 F (36.8 C) (09/26 1145) Temp Source: Oral (09/26 1145) BP: 110/60 mmHg (09/26 1145) Pulse Rate: 100 (09/26 1145)  Labs:  Recent Labs  02/05/15 0500 02/06/15 0517 02/06/15 0830 02/07/15 0441  WBC 20.0*  --  17.9* 16.9*  HGB 8.5*  --  7.6* 8.0*  PLT 487*  --  384 475*  CREATININE 0.72 0.75  --  0.77   Estimated Creatinine Clearance: 67.7 mL/min (by C-G formula based on Cr of 0.77).  Recent Labs  02/04/15 1950  [place] 15     Microbiology: Recent Results (from the past 720 hour(s))  Blood culture (routine x 2)     Status: None   Collection Time: 02/01/15  7:41 AM  Result Value Ref Range Status   Specimen Description BLOOD LEFT HAND  Final   Special Requests   Final    BOTTLES DRAWN AEROBIC AND ANAEROBIC 2 CC AERO 1 CC ANAERO   Culture NO GROWTH 5 DAYS  Final   Report Status 02/06/2015 FINAL  Final  Culture, expectorated sputum-assessment     Status: None   Collection Time: 02/01/15  7:42 AM  Result Value Ref Range Status   Specimen Description EXPECTORATED SPUTUM  Final   Special Requests Immunocompromised  Final   Sputum evaluation THIS SPECIMEN IS ACCEPTABLE FOR SPUTUM CULTURE  Final   Report Status 02/01/2015 FINAL  Final  Culture, respiratory (NON-Expectorated)     Status: None (Preliminary result)   Collection Time: 02/01/15  7:42 AM  Result Value Ref Range Status   Specimen Description EXPECTORATED SPUTUM  Final   Special Requests Immunocompromised Reflexed from [id number]  Final   Gram Stain   Final    EXCELLENT SPECIMEN - 90-100% WBCS MANY WBC SEEN FEW GRAM NEGATIVE RODS MANY GRAM POSITIVE COCCI IN CHAINS    Culture MOLD SENT TO STATE FOR IDENTIFICATION  Final   Report Status PENDING   Incomplete  Blood culture (routine x 2)     Status: None   Collection Time: 02/01/15  7:43 AM  Result Value Ref Range Status   Specimen Description BLOOD RIGHT ARM  Final   Special Requests   Final    BOTTLES DRAWN AEROBIC AND ANAEROBIC 1 CC ANAERO 4 CC AERO   Culture NO GROWTH 5 DAYS  Final   Report Status 02/06/2015 FINAL  Final  Urine culture     Status: None   Collection Time: 02/01/15  8:35 AM  Result Value Ref Range Status   Specimen Description URINE, CLEAN CATCH  Final   Special Requests NONE  Final   Culture NO GROWTH 1 DAY  Final   Report Status 02/02/2015 FINAL  Final  MRSA PCR Screening     Status: None   Collection Time: 02/01/15 12:50 PM  Result Value Ref Range Status   MRSA by PCR NEGATIVE NEGATIVE Final    Comment:        The GeneXpert MRSA Assay (FDA approved for NASAL specimens only), is one component of a comprehensive MRSA colonization surveillance program. It is not intended to diagnose MRSA infection nor to guide or monitor treatment for MRSA infections.   Culture, expectorated sputum-assessment     Status: None   Collection Time: 02/02/15 12:10 PM  Result Value  Ref Range Status   Specimen Description INDUCED SPUTUM  Final   Special Requests NONE  Final   Sputum evaluation THIS SPECIMEN IS ACCEPTABLE FOR SPUTUM CULTURE  Final   Report Status 02/02/2015 FINAL  Final  Culture, respiratory (NON-Expectorated)     Status: None (Preliminary result)   Collection Time: 02/02/15 12:10 PM  Result Value Ref Range Status   Specimen Description INDUCED SPUTUM  Final   Special Requests NONE Reflexed from [id number]  Final   Gram Stain   Final    EXCELLENT SPECIMEN - 90-100% WBCS MANY WBC SEEN RARE FUNGAL ELEMENT SEEN    Culture   Final    MOLD PREVIOUS MOLD SENT TO THE STATE LAB[facility] FOR IDENTIFICATION LIGHT GROWTH YEAST IDENTIFICATION TO FOLLOW    Report Status PENDING  Incomplete  C difficile quick scan w PCR reflex     Status: None   Collection Time:  02/03/15 10:33 AM  Result Value Ref Range Status   C Diff antigen NEGATIVE NEGATIVE Final   C Diff toxin NEGATIVE NEGATIVE Final   C Diff interpretation Negative for C. difficile  Final    Medical History: Past Medical History  Diagnosis Date  . Seizures      Medications:  Scheduled:  . amiodarone  200 mg Oral Daily  . citalopram  20 mg Oral BH-q7a  . dextromethorphan  30 mg Oral BID   And  . guaiFENesin  600 mg Oral BID  . enoxaparin (LOVENOX) injection  40 mg Subcutaneous Q24H  . feeding supplement (ENSURE ENLIVE)  237 mL Oral TID  . folic acid  1 mg Oral Daily  . multivitamin with minerals  1 tablet Oral Daily  . nicotine  21 mg Transdermal Daily  . piperacillin-tazobactam (ZOSYN)  IV  3.375 g Intravenous 3 times per day  . potassium chloride  20 mEq Oral TID WC  . primidone  200 mg Oral QHS  . sodium chloride  3 mL Intravenous Q12H  . thiamine  100 mg Oral Daily  . topiramate  25 mg Oral BID  . vancomycin  750 mg Intravenous Q8H  . voriconazole (VFEND - PT WT </= 85 kg) IV  4 mg/kg Intravenous Q12H   Assessment: Pt is a 59 year old male who presents with SOB, cough, x-ray showing left lobe consolidation with possible pneumonia or tuberculosis. Pt also reports blood-tinged sputum.   Patient currently on vancomycin and Zosyn as well as voriconazole with respiratory culture growing mold. No other species isolated at this time. AFB pending. Currently on day 7 of vancomycin and zosyn. Renal function stable.   Goal of Therapy:  Vancomycin trough level 15-20 mcg/ml  Plan: Will continue current orders for vancomycin 750mg  IV Q8H. Will continue to follow trough, recheck tomorrow. Continue to follow renal function.   Continue zosyn 3.375gm IV Q8H extended infusion.   Pharmacy to follow per consult  Rexene Edison, PharmD Clinical Pharmacist 02/07/2015 3:10 PM

## 2015-02-07 NOTE — Progress Notes (Signed)
[facility] Encounter Note  Patient: Jonathan [name] / Admit Date: 02/01/2015 / Date of Encounter: 02/07/2015, 7:21 AM   Subjective: Patient is continuing to have some cough and congestion due to significant pneumonia relatively stable at this time No hemodynamic changes and patient is currently with controlled heart rate with a long 5 second pause without symptoms consistent with some sick sinus syndrome Patient is still weak Review of Systems: Positive for: Cough and congestion Negative for: Vision change, hearing change, syncope, dizziness, nausea, vomiting,diarrhea, bloody stool, stomach pain,  diaphoresis, urinary frequency, urinary pain,skin lesions, skin rashes Others previously listed  Objective: Telemetry: Controlled ventricular rate but appears to be normal sinus rhythm today Physical Exam: Blood pressure 115/66, pulse 107, temperature 100.5 F (38.1 C), temperature source Oral, resp. rate 18, height 5\' 9"  (1.753 m), weight 103 lb 9.9 oz (47 kg), SpO2 94 %. Body mass index is 15.29 kg/(m^2). General: Well developed, well nourished, in no acute distress. Head: Normocephalic, atraumatic, sclera non-icteric, no xanthomas, nares are without discharge. Neck: No apparent masses Lungs: Normal respirations with few wheezes, diffuse rhonchi, no rales , basilar crackles   Heart: Regular rate and rhythm, normal S1 S2, no murmur, no rub, no gallop, PMI is normal size and placement, carotid upstroke normal without bruit, jugular venous pressure normal Abdomen: Soft, non-tender, non-distended with normoactive bowel sounds. No hepatosplenomegaly. Abdominal aorta is normal size without bruit Extremities: Trace edema, no clubbing, no cyanosis, no ulcers,  Peripheral: 2+ radial, 2+ femoral, 2+ dorsal pedal pulses Neuro: Alert and oriented. Moves all extremities spontaneously. Psych:  Responds to questions appropriately with a normal affect.   Intake/Output Summary (Last  24 hours) at 02/07/15 0721 Last data filed at 02/07/15 0610  Gross per 24 hour  Intake   2360 ml  Output    300 ml  Net   2060 ml    Inpatient Medications:  . amiodarone  200 mg Oral Daily  . citalopram  20 mg Oral BH-q7a  . dextromethorphan  30 mg Oral BID   And  . guaiFENesin  600 mg Oral BID  . enoxaparin (LOVENOX) injection  40 mg Subcutaneous Q24H  . feeding supplement (ENSURE ENLIVE)  237 mL Oral TID  . folic acid  1 mg Oral Daily  . multivitamin with minerals  1 tablet Oral Daily  . nicotine  21 mg Transdermal Daily  . piperacillin-tazobactam (ZOSYN)  IV  3.375 g Intravenous 3 times per day  . potassium chloride  20 mEq Oral TID WC  . primidone  200 mg Oral QHS  . sodium chloride  3 mL Intravenous Q12H  . thiamine  100 mg Oral Daily  . topiramate  25 mg Oral BID  . vancomycin  750 mg Intravenous Q8H  . voriconazole (VFEND - PT WT </= 85 kg) IV  4 mg/kg Intravenous Q12H   Infusions:  . sodium chloride 50 mL/hr at 02/07/15 0610    Labs:  Recent Labs  02/05/15 0500 02/06/15 0517 02/07/15 0441  NA 139 138 139  K 3.1* 2.9* 3.2*  CL 107 108 110  CO2 25 24 24   GLUCOSE 88 100* 103*  BUN 7 8 8   CREATININE 0.72 0.75 0.77  CALCIUM 7.5* 7.2* 7.2*  MG 1.7 1.8  --   PHOS 2.2* 2.9  --    No results for input(s): AST, ALT, ALKPHOS, BILITOT, PROT, ALBUMIN in the last 72 hours.  Recent Labs  02/06/15 0830 02/07/15 0441  WBC 17.9* 16.9*  HGB 7.6* 8.0*  HCT 23.4* 24.3*  MCV 91.4 90.9  PLT 384 475*   No results for input(s): CKTOTAL, CKMB, TROPONINI in the last 72 hours. Invalid input(s): POCBNP No results for input(s): HGBA1C in the last 72 hours.   Weights: Filed Weights   02/01/15 0719 02/01/15 1220  Weight: 114 lb (51.71 kg) 103 lb 9.9 oz (47 kg)     Radiology/Studies:  Dg Chest 1 View  02/04/2015   CLINICAL DATA:  Shortness of breath.  Seizure.  EXAM: CHEST 1 VIEW  COMPARISON:  CT 02/03/2015.  Chest x-ray 02/02/2015.  FINDINGS: Right PICC line in good  anatomic position. Large cavitary lesion in the left upper lobe is again noted. Mild infiltrate in the right upper lobe and left lung base cannot be excluded. Tiny pleural effusions. No pneumothorax. Heart size normal.  IMPRESSION: 1. Persistent unchanged cavitary lesion in the left upper lobe. 2. Mild infiltrate right upper lobe and left lower lobe cannot be excluded. Tiny bilateral pleural effusions. 3. Right PICC line stable position .   Electronically Signed   By: Marcello Moores  Register   On: 02/04/2015 07:33   Ct Chest W Contrast  02/03/2015   CLINICAL DATA:  59 year old male with acute respiratory failure likely secondary to Left-sided pneumonia. Complicated by septic shock with type II myocardial infarction due to demand ischemia, and atrial fibrillation with rapid ventricular rate.^22mL OMNIPAQUE IOHEXOL 300 MG/ML SOLN.  EXAM: CT CHEST WITH CONTRAST  TECHNIQUE: Multidetector CT imaging of the chest was performed during intravenous contrast administration.  CONTRAST:  110mL OMNIPAQUE IOHEXOL 300 MG/ML  SOLN  COMPARISON:  Chest x-ray 02/02/2015  FINDINGS: Heart: Heart size is normal. Coronary artery calcifications are present. Central line/PICC tip to the lower superior vena cava.  Vascular structures: Pulmonary arteries are grossly normally opacified. Thoracic aorta is normal in appearance.  Mediastinum/thyroid: The visualized portion of the thyroid gland has a normal appearance. Mediastinal lymph nodes are present. Pretracheal lymph node is 0.9 cm. Subcarinal lymph node is 1.2 cm. The small left hilar lymph nodes are present, largest measuring approximate 1.0 cm.  Lungs/Airways: Within the left lung apex new there is a large cavitary lesion containing a rounded mass measuring 2 3.8 x 3.3 cm. 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 There is significant airspace disease surrounding this cavitary lesion, occupying the entire upper lobe. There patchy areas of infiltrate within the left lower lobe. Similar patchy  densities identified within the right upper lobe. There are bilateral pleural effusions.  Upper abdomen: Possible low-attenuation lesion within the central portion of the right kidney versus hydronephrosis or parapelvic cyst. This abnormality is incompletely evaluated as it is on the last image of this study. Gallbladder is present and is contracted. There is diffuse body wall edema. Stomach is distended.  Chest wall/osseous structures: Unremarkable.  IMPRESSION: 1. 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 The cavitary lesion may be the result of fungal infection, TB, or malignancy. 2. Patchy areas of infiltrate within the right upper lobe and left lower lobe. 3. Bilateral pleural effusions. 4. Coronary artery disease. 5. Right-sided central line/PICC to the lower superior vena cava. 6. Small mediastinal and hilar lymph nodes may be reactive. 7. Low-attenuation structure within the central portion of the right kidney is incompletely evaluated. Consider further evaluation with renal ultrasound. 8. These results will be called to the ordering clinician or representative by the Radiologist Assistant, and communication documented in the PACS or zVision Dashboard.   Electronically  Signed   By: Nolon Nations M.D.   On: 02/03/2015 19:11   Dg Chest Port 1 View  02/02/2015   CLINICAL DATA:  PICC line placement  EXAM: PORTABLE CHEST - 1 VIEW  COMPARISON:  02/02/2015  FINDINGS: Right PICC line has been placed.  The tip is in the SVC.  Continued dense consolidation in the left upper lobe, unchanged. Heart is normal size. No confluent opacity on the right.  IMPRESSION: Right PICC line tip in the SVC.  No change in the dense consolidation in the left upper lobe.   Electronically Signed   By: Rolm Baptise M.D.   On: 02/02/2015 15:13   Dg Chest Port 1 View  02/02/2015   CLINICAL DATA:  Pneumonia. Shortness of breath. Weight loss. Nights with  EXAM:  PORTABLE CHEST - 1 VIEW  COMPARISON:  02/01/2015  FINDINGS: Dense consolidation again noted in the upper lobe, unchanged. Questionable central cavitation. No confluent opacity on the right. Heart is normal size. No visible effusions.  IMPRESSION: Stable dense consolidation in the left upper lobe with questionable areas of central cavitation.   Electronically Signed   By: Rolm Baptise M.D.   On: 02/02/2015 11:07   Dg Chest Portable 1 View  02/01/2015   CLINICAL DATA:  Productive cough, shortness of breath, assess for TB  EXAM: PORTABLE CHEST - 1 VIEW  COMPARISON:  None.  FINDINGS: There is consolidation of the left upper lobe with cavitation. The right lung is clear. There is no pleural effusion bilaterally. The mediastinal contour and cardiac silhouette are normal. The bony structures are normal.  IMPRESSION: Consolidation of the left upper lobe with cavitation. The findings are likely infectious in nature, TB is not excluded based on the x-ray.   Electronically Signed   By: Abelardo Diesel M.D.   On: 02/01/2015 09:15     Assessment and Recommendation  60 y.o. male with acute left lower lobe pneumonia with possible cavitation and atrial fibrillation nonvalvular in nature now controlled with amiodarone at appropriate dosage the remaining mainly in normal sinus rhythm with one long pause while eating dinner asymptomatic in nature less than previous causes. This may be consistent with sick sinus syndrome and may need further long-term evaluation after patient continues to have ambulate after full treatment of pneumonia as to whether pacemaker placement may be necessary. During treatment of infection would not place a pacemaker due to concerns of infection of the pacemaker therefore will watch closely for any symptomatic evidence of atrial fibrillation and/or long pauses with adjustments of medication dosages 1. Continue amiodarone at current dosage for maintenance of normal sinus rhythm and continue to follow for  any significant rhythm disturbances and/or pauses requiring adjustments of medication management 2. No anticoagulation at this time due to concerns of bleeding risk and treatment of her current infection 3. Begin ambulation and follow for any further episodes of long pauses and/or sick sinus syndrome 4. No further cardiac diagnostics necessary at this time 5. Call if further questions  Signed, Serafina Royals M.D. FACC

## 2015-02-07 NOTE — Progress Notes (Addendum)
Pharmacy Consult for Electrolyte Management  No Known Allergies  Patient Measurements: Height: 5\' 9"  (175.3 cm) Weight: 103 lb 9.9 oz (47 kg) IBW/kg (Calculated) : 70.7  Vital Signs: Temp: 100.1 F (37.8 C) (09/26 0052) Temp Source: Oral (09/26 0052) BP: 100/60 mmHg (09/26 0052) Pulse Rate: 110 (09/26 0052) Intake/Output from previous day: 09/25 0701 - 09/26 0700 In: 1390 [P.O.:540; I.V.:700; IV Piggyback:150] Out: 0  Intake/Output from this shift:    Labs:  Recent Labs  02/05/15 0500 02/06/15 0830 02/07/15 0441  WBC 20.0* 17.9* 16.9*  HGB 8.5* 7.6* 8.0*  HCT 25.8* 23.4* 24.3*  PLT 487* 384 475*     Recent Labs  02/04/15 0559 02/05/15 0500 02/06/15 0517 02/07/15 0441  NA 139 139 138 139  K 3.0* 3.1* 2.9* 3.2*  CL 111 107 108 110  CO2 25 25 24 24   GLUCOSE 114* 88 100* 103*  BUN 10 7 8 8   CREATININE 0.61 0.72 0.75 0.77  CALCIUM 7.6* 7.5* 7.2* 7.2*  MG 2.0 1.7 1.8  --   PHOS 2.0* 2.2* 2.9  --    Estimated Creatinine Clearance: 67.7 mL/min (by C-G formula based on Cr of 0.77).   No results for input(s): GLUCAP in the last 72 hours.  Medical History: Past Medical History  Diagnosis Date  . Seizures     Medications:  Scheduled:  . amiodarone  200 mg Oral Daily  . citalopram  20 mg Oral BH-q7a  . dextromethorphan  30 mg Oral BID   And  . guaiFENesin  600 mg Oral BID  . enoxaparin (LOVENOX) injection  40 mg Subcutaneous Q24H  . feeding supplement (ENSURE ENLIVE)  237 mL Oral TID  . folic acid  1 mg Oral Daily  . multivitamin with minerals  1 tablet Oral Daily  . nicotine  21 mg Transdermal Daily  . piperacillin-tazobactam (ZOSYN)  IV  3.375 g Intravenous 3 times per day  . potassium chloride  20 mEq Oral TID WC  . primidone  200 mg Oral QHS  . sodium chloride  3 mL Intravenous Q12H  . thiamine  100 mg Oral Daily  . topiramate  25 mg Oral BID  . vancomycin  750 mg Intravenous Q8H  . voriconazole (VFEND - PT WT </= 85 kg) IV  4 mg/kg Intravenous  Q12H   Infusions:  . sodium chloride 50 mL/hr at 02/06/15 8242   PRN: acetaminophen **OR** acetaminophen, HYDROcodone-acetaminophen, LORazepam, senna-docusate  Assessment: Pharmacy consulted to assist in managing electrolytes in this 59 y/o M with acute respiratory failure due to LUL cavitary PNA.   Plan:  Phos and K are low today despite runs of potassium chloride yesterday. Kphos 30 mmol given once today. Will give an additional 6 runs of KCl 10 meq iv for a total of about 100 meq K today. Will f/u am labs.   9/24  K+ 3.1, PO4- 2.2. 30 mmol potassium phosphate IV x1 and 60 mEq KCl PO in divided doses ordered. Recheck electrolytes in AM.  9/25 AM K+ 2.9. 20 mEq PO TID with meals ordered. BMP in AM.  9/26 AM K+ 3.2.  Appears to be normalizing with scheduled regimen.  Pt did receive additional 40 mEq x1 dose yesterday. Recheck BMP in AM.  McBane,Matthew S 02/07/2015,5:40 AM

## 2015-02-07 NOTE — Progress Notes (Signed)
Clinical Education officer, museum (CSW) received report in progression rounds from RN that patient is concerned about his home environment. CSW assessment was completed on 02/03/15. CSW met with patient today to follow up. CSW introduced self and explained role of CSW department. Patient was alert and oriented and sitting up in the bed eating breakfast. Patient reported that he lives alone in a house that is grandfather left him. Per patient the house is paid off. Patient reported that there is trash on the floor that he is concerned about. Patient also reported that he would like to go live with his father and mother. Per patient his parents also live in [place] a few blocks away from him. Patient reported that he has not worked since 2008 and does not have any income. Patient stated that is dad helps him financially as well getting his basic needs met. Per patient his father also provides transforation when needed and can pick him up from the hospital when he is discharged. Patient stated that he did not want to return to his home and wanted to live with his parents. Patient reported that he has been discussing this with his father. CSW provided patient with [facility] resources including [employer], [place]. Patient gave CSW permission to contact his father. CSW contacted patient's father Jonathan [name] [phone]. Per father he is the caregiver for his wife and has a lot on his plate. Dad did state that he has discussed with patient the possibility of him moving in with them. Dad did report that he will pick patient up from the hospital when he is discharged. CSW also made dad aware of the resources patient received today. CSW will continue to follow and assist as needed.   Blima Rich, [place] [phone]

## 2015-02-07 NOTE — Progress Notes (Signed)
[place] INFECTIOUS DISEASE PROGRESS NOTE Date of Admission:  02/01/2015     ID: LARAY Jonathan [name] is a 59 y.o. male with cavitary upper lobe PNA, 50 # wt loss  Active Problems:   Sepsis   Subjective: Fevers low grade persist.  WBC down to 16 Feels a little better, still with sputum but breathing better a little  ROS  Eleven systems are reviewed and negative except per hpi  Medications:  Antibiotics Given (last 72 hours)    Date/Time Action Medication Dose Rate   02/04/15 1822 Given   piperacillin-tazobactam (ZOSYN) IVPB 3.375 g 3.375 g 12.5 mL/hr   02/04/15 2329 Given   vancomycin (VANCOCIN) IVPB 750 mg/150 ml premix 750 mg 150 mL/hr   02/05/15 0117 Given   piperacillin-tazobactam (ZOSYN) IVPB 3.375 g 3.375 g 12.5 mL/hr   02/05/15 2440 Given   vancomycin (VANCOCIN) IVPB 750 mg/150 ml premix 750 mg 150 mL/hr   02/05/15 1010 Given   piperacillin-tazobactam (ZOSYN) IVPB 3.375 g 3.375 g 12.5 mL/hr   02/05/15 1159 Given   vancomycin (VANCOCIN) IVPB 750 mg/150 ml premix 750 mg 150 mL/hr   02/05/15 1707 Given   piperacillin-tazobactam (ZOSYN) IVPB 3.375 g 3.375 g 12.5 mL/hr   02/05/15 2103 Given   vancomycin (VANCOCIN) IVPB 750 mg/150 ml premix 750 mg 150 mL/hr   02/06/15 0030 Given   piperacillin-tazobactam (ZOSYN) IVPB 3.375 g 3.375 g 12.5 mL/hr   02/06/15 0450 Given   vancomycin (VANCOCIN) IVPB 750 mg/150 ml premix 750 mg 150 mL/hr   02/06/15 0859 Given   piperacillin-tazobactam (ZOSYN) IVPB 3.375 g 3.375 g 12.5 mL/hr   02/06/15 1142 Given   vancomycin (VANCOCIN) IVPB 750 mg/150 ml premix 750 mg 150 mL/hr   02/06/15 1624 Given   piperacillin-tazobactam (ZOSYN) IVPB 3.375 g 3.375 g 12.5 mL/hr   02/06/15 2133 Given   vancomycin (VANCOCIN) IVPB 750 mg/150 ml premix 750 mg 150 mL/hr   02/07/15 0006 Given   piperacillin-tazobactam (ZOSYN) IVPB 3.375 g 3.375 g 12.5 mL/hr   02/07/15 0441 Given   vancomycin (VANCOCIN) IVPB 750 mg/150 ml premix 750 mg 150 mL/hr   02/07/15  0930 Given   piperacillin-tazobactam (ZOSYN) IVPB 3.375 g 3.375 g 12.5 mL/hr   02/07/15 1522 Given   vancomycin (VANCOCIN) IVPB 750 mg/150 ml premix 750 mg 150 mL/hr     . amiodarone  200 mg Oral Daily  . citalopram  20 mg Oral BH-q7a  . dextromethorphan  30 mg Oral BID   And  . guaiFENesin  600 mg Oral BID  . enoxaparin (LOVENOX) injection  40 mg Subcutaneous Q24H  . feeding supplement (ENSURE ENLIVE)  237 mL Oral TID  . folic acid  1 mg Oral Daily  . multivitamin with minerals  1 tablet Oral Daily  . nicotine  21 mg Transdermal Daily  . piperacillin-tazobactam (ZOSYN)  IV  3.375 g Intravenous 3 times per day  . potassium chloride  20 mEq Oral TID WC  . primidone  200 mg Oral QHS  . sodium chloride  3 mL Intravenous Q12H  . thiamine  100 mg Oral Daily  . topiramate  25 mg Oral BID  . vancomycin  750 mg Intravenous Q8H  . voriconazole (VFEND - PT WT </= 85 kg) IV  4 mg/kg Intravenous Q12H    Objective: Vital signs in last 24 hours: Temp:  [98.2 F (36.8 C)-100.5 F (38.1 C)] 99.3 F (37.4 C) (09/26 1515) Pulse Rate:  [99-110] 103 (09/26 1515) Resp:  [16-20]  16 (09/26 1515) BP: (99-115)/(53-70) 99/53 mmHg (09/26 1515) SpO2:  [94 %-97 %] 96 % (09/26 1515) Constitutional: He is oriented to person, place, and time. Very thin, dishelved, slurred speech, tremor HENT: Mouth/Throat: Oropharynx is clear and moist. Poor dentition No oropharyngeal exudate.  Cardiovascular: Normal rate, re gular rhythm and normal heart sounds. Exam reveals no gallop and no friction rub.  Pulmonary/Chest: poor air movement on L side. Abdominal: Soft. Bowel sounds are normal. He exhibits no distension. There is no tenderness.  Lymphadenopathy: He has no cervical adenopathy.  Neurological: He is alert and oriented to person, place, and time.  Skin: Skin is warm and dry. No rash noted. No erythema.  Psychiatric: He has a normal mood and affect. His behavior is normal.   Lab Results  Recent Labs   02/06/15 0517 02/06/15 0830 02/07/15 0441  WBC  --  17.9* 16.9*  HGB  --  7.6* 8.0*  HCT  --  23.4* 24.3*  NA 138  --  139  K 2.9*  --  3.2*  CL 108  --  110  CO2 24  --  24  BUN 8  --  8  CREATININE 0.75  --  0.77    Microbiology: Results for orders placed or performed during the hospital encounter of 02/01/15  Blood culture (routine x 2)     Status: None   Collection Time: 02/01/15  7:41 AM  Result Value Ref Range Status   Specimen Description BLOOD LEFT HAND  Final   Special Requests   Final    BOTTLES DRAWN AEROBIC AND ANAEROBIC 2 CC AERO 1 CC ANAERO   Culture NO GROWTH 5 DAYS  Final   Report Status 02/06/2015 FINAL  Final  Culture, expectorated sputum-assessment     Status: None   Collection Time: 02/01/15  7:42 AM  Result Value Ref Range Status   Specimen Description EXPECTORATED SPUTUM  Final   Special Requests Immunocompromised  Final   Sputum evaluation THIS SPECIMEN IS ACCEPTABLE FOR SPUTUM CULTURE  Final   Report Status 02/01/2015 FINAL  Final  Culture, respiratory (NON-Expectorated)     Status: None (Preliminary result)   Collection Time: 02/01/15  7:42 AM  Result Value Ref Range Status   Specimen Description EXPECTORATED SPUTUM  Final   Special Requests Immunocompromised Reflexed from [id number]  Final   Gram Stain   Final    EXCELLENT SPECIMEN - 90-100% WBCS MANY WBC SEEN FEW GRAM NEGATIVE RODS MANY GRAM POSITIVE COCCI IN CHAINS    Culture MOLD SENT TO STATE FOR IDENTIFICATION  Final   Report Status PENDING  Incomplete  Blood culture (routine x 2)     Status: None   Collection Time: 02/01/15  7:43 AM  Result Value Ref Range Status   Specimen Description BLOOD RIGHT ARM  Final   Special Requests   Final    BOTTLES DRAWN AEROBIC AND ANAEROBIC 1 CC ANAERO 4 CC AERO   Culture NO GROWTH 5 DAYS  Final   Report Status 02/06/2015 FINAL  Final  Urine culture     Status: None   Collection Time: 02/01/15  8:35 AM  Result Value Ref Range Status   Specimen  Description URINE, CLEAN CATCH  Final   Special Requests NONE  Final   Culture NO GROWTH 1 DAY  Final   Report Status 02/02/2015 FINAL  Final  MRSA PCR Screening     Status: None   Collection Time: 02/01/15 12:50 PM  Result Value Ref Range  Status   MRSA by PCR NEGATIVE NEGATIVE Final    Comment:        The GeneXpert MRSA Assay (FDA approved for NASAL specimens only), is one component of a comprehensive MRSA colonization surveillance program. It is not intended to diagnose MRSA infection nor to guide or monitor treatment for MRSA infections.   Culture, expectorated sputum-assessment     Status: None   Collection Time: 02/02/15 12:10 PM  Result Value Ref Range Status   Specimen Description INDUCED SPUTUM  Final   Special Requests NONE  Final   Sputum evaluation THIS SPECIMEN IS ACCEPTABLE FOR SPUTUM CULTURE  Final   Report Status 02/02/2015 FINAL  Final  Culture, respiratory (NON-Expectorated)     Status: None (Preliminary result)   Collection Time: 02/02/15 12:10 PM  Result Value Ref Range Status   Specimen Description INDUCED SPUTUM  Final   Special Requests NONE Reflexed from [id number]  Final   Gram Stain   Final    EXCELLENT SPECIMEN - 90-100% WBCS MANY WBC SEEN RARE FUNGAL ELEMENT SEEN    Culture   Final    MOLD PREVIOUS MOLD SENT TO THE STATE LABORATORY FOR IDENTIFICATION LIGHT GROWTH YEAST IDENTIFICATION TO FOLLOW    Report Status PENDING  Incomplete  C difficile quick scan w PCR reflex     Status: None   Collection Time: 02/03/15 10:33 AM  Result Value Ref Range Status   C Diff antigen NEGATIVE NEGATIVE Final   C Diff toxin NEGATIVE NEGATIVE Final   C Diff interpretation Negative for C. difficile  Final    Studies/Results: No results found.  Assessment/Plan: CREEK GAN is a 59 y.o. male with seizure disorder, essential tremor, chronic heavy tobacco and etoh use now with 50# wt los in 2 years, chronic cough with sputum and hemoptysis. No history of TB  contacts, prison, [employer]  AFB smears negative x 3 (Sept 2016).. WBC down 30K to 16 K. HIV neg, sputum cx neg for bacteria but + mold. QF gold indetermine. CT with extensive cavity with fungal ball.    Reports some less sputum, no hemoptysis. Breathing a little better.   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  Await thoughts from  Dr Genevive Bi to see if surgical removal of the LUL would be beneficial as he is unlikely to have a very satisfactory response to antifungal therapy alone.   Recommendations Dc airborne isolation Can stop vanco and zosyn - done Cont voriconazole- will need likely lifelong therapy - change to oral  Will need fu CT to eval response in 2-3 months.  Thank you very much for the consult. Will follow with you.  [place], [place]   02/07/2015, 3:50 PM

## 2015-02-07 NOTE — Progress Notes (Signed)
Spoke with lab regarding patients AFB lab that was sent out on the 20 th and 21st to [facility]. Results have not resulted which is unusual. Lab tech agreed and is going to follow up with [facility] and get back to me. Will continue to monitor.

## 2015-02-08 ENCOUNTER — Inpatient Hospital Stay: Payer: Medicaid Other

## 2015-02-08 DIAGNOSIS — J852 Abscess of lung without pneumonia: Secondary | ICD-10-CM

## 2015-02-08 DIAGNOSIS — B4489 Other forms of aspergillosis: Secondary | ICD-10-CM

## 2015-02-08 DIAGNOSIS — J189 Pneumonia, unspecified organism: Secondary | ICD-10-CM

## 2015-02-08 DIAGNOSIS — A419 Sepsis, unspecified organism: Secondary | ICD-10-CM

## 2015-02-08 DIAGNOSIS — E43 Unspecified severe protein-calorie malnutrition: Secondary | ICD-10-CM | POA: Insufficient documentation

## 2015-02-08 DIAGNOSIS — J984 Other disorders of lung: Secondary | ICD-10-CM

## 2015-02-08 LAB — BASIC METABOLIC PANEL
ANION GAP: 6 (ref 5–15)
BUN: 8 mg/dL (ref 6–20)
CALCIUM: 7.5 mg/dL — AB (ref 8.9–10.3)
CHLORIDE: 111 mmol/L (ref 101–111)
CO2: 22 mmol/L (ref 22–32)
CREATININE: 0.69 mg/dL (ref 0.61–1.24)
GFR calc Af Amer: >60 mL/min (ref >60)
GFR calc non Af Amer: >60 mL/min (ref >60)
GLUCOSE: 92 mg/dL (ref 65–99)
Potassium: 3.5 mmol/L (ref 3.5–5.1)
Sodium: 139 mmol/L (ref 135–145)

## 2015-02-08 LAB — CBC
HCT: 25.2 % — ABNORMAL LOW (ref 40.0–52.0)
HEMOGLOBIN: 8.4 g/dL — AB (ref 13.0–18.0)
MCH: 30.2 pg (ref 26.0–34.0)
MCHC: 33.2 g/dL (ref 32.0–36.0)
MCV: 91 fL (ref 80.0–100.0)
Platelets: 514 10*3/uL — ABNORMAL HIGH (ref 150–440)
RBC: 2.77 MIL/uL — ABNORMAL LOW (ref 4.40–5.90)
RDW: 15.2 % — ABNORMAL HIGH (ref 11.5–14.5)
WBC: 16 10*3/uL — ABNORMAL HIGH (ref 3.8–10.6)

## 2015-02-08 NOTE — Care Management Note (Signed)
Case Management Note  Patient Details  Name: Jonathan [name] MRN: [id number] Date of Birth: [birth year]  Subjective/Objective:    Left cavitary Aspergillosis. Pending surgical consult for possible LUL resection.  Low grad temps. WBC from 30 to 16. SW working with patient. He is uninsured. SW has contact the financial office for assistance with Medicaid applications.                 Action/Plan: Following progression.   Expected Discharge Date:                  Expected Discharge Plan:     In-House Referral:  Clinical Social Work  Discharge planning Services     Post Acute Care Choice:    Choice offered to:     DME Arranged:    DME Agency:     HH Arranged:    [place] Agency:     Status of Service:  In process, will continue to follow  Medicare Important Message Given:    Date Medicare IM Given:    Medicare IM give by:    Date Additional Medicare IM Given:    Additional Medicare Important Message give by:     If discussed at [place] of Stay Meetings, dates discussed:    Additional Comments:  Jolly Mango, RN 02/08/2015, 9:45 AM

## 2015-02-08 NOTE — Progress Notes (Addendum)
Jonathan [name] at [place] NAME: Jonathan [name]    MR#:  [id number]  DATE OF BIRTH:  07-24-1955  SUBJECTIVE:   cough with sputum, tolerated PT REVIEW OF SYSTEMS:    Review of Systems  Constitutional: Positive for malaise/fatigue. Negative for fever, chills and weight loss.  HENT: Negative for sore throat.   Eyes: Negative for blurred vision.  Respiratory: Positive for cough. Negative for hemoptysis, shortness of breath and wheezing.   Cardiovascular: Negative for chest pain, palpitations and leg swelling.  Gastrointestinal: Negative for nausea, vomiting, abdominal pain and blood in stool.  Genitourinary: Negative for dysuria.  Musculoskeletal: Negative for back pain.  Neurological: Positive for weakness. Negative for dizziness, tremors and headaches.  Endo/Heme/Allergies: Does not bruise/bleed easily.  Psychiatric/Behavioral: Negative for depression.    Tolerating Diet: Yes      DRUG ALLERGIES:  No Known Allergies  VITALS:  Blood pressure 104/58, pulse 129, temperature 98.8 F (37.1 C), temperature source Oral, resp. rate 18, height 5\' 9"  (1.753 m), weight 47 kg (103 lb 9.9 oz), SpO2 95 %.  PHYSICAL EXAMINATION:   Physical Exam  Constitutional: He is oriented to person, place, and time and well-developed, well-nourished, and in no distress. No distress.  Frail weak  HENT:  Head: Normocephalic.  Eyes: No scleral icterus.  Neck: Normal range of motion. Neck supple. No JVD present. No tracheal deviation present.  Cardiovascular: Regular rhythm and normal heart sounds.  Exam reveals no gallop and no friction rub.   No murmur heard. Tachycardic  Pulmonary/Chest: Effort normal. No respiratory distress. He has no wheezes. He has no rales. He exhibits no tenderness.  rhonchii  Abdominal: Soft. Bowel sounds are normal. He exhibits no distension and no mass. There is no tenderness. There is no rebound and no guarding.   Musculoskeletal: Normal range of motion. He exhibits edema.  Neurological: He is alert and oriented to person, place, and time.  Skin: Skin is warm. No rash noted. He is not diaphoretic. No erythema.  Psychiatric: Affect and judgment normal.   The patient has mild to moderate bilateral rhonchii in the lungs. Bilateral arm edema.   LABORATORY PANEL:   CBC  Recent Labs Lab 02/08/15 0759  WBC 16.0*  HGB 8.4*  HCT 25.2*  PLT 514*   ------------------------------------------------------------------------------------------------------------------  Chemistries   Recent Labs Lab 02/02/15 1221  02/06/15 0517  02/08/15 0759  NA 133*  < > 138  < > 139  K 2.8*  < > 2.9*  < > 3.5  CL 105  < > 108  < > 111  CO2 21*  < > 24  < > 22  GLUCOSE 127*  < > 100*  < > 92  BUN 11  < > 8  < > 8  CREATININE 0.69  < > 0.75  < > 0.69  CALCIUM 7.1*  < > 7.2*  < > 7.5*  MG  --   < > 1.8  --   --   AST 49*  --   --   --   --   ALT 20  --   --   --   --   ALKPHOS 80  --   --   --   --   BILITOT 0.4  --   --   --   --   < > = values in this interval not displayed. ------------------------------------------------------------------------------------------------------------------  Cardiac Enzymes  Recent Labs Lab 02/01/15 2355 02/02/15 1221  TROPONINI 0.05* 0.04*   ------------------------------------------------------------------------------------------------------------------  RADIOLOGY:  Dg Chest 2 View  02/08/2015   CLINICAL DATA:  Follow-up pneumonia, shortness of breath and sepsis.  EXAM: CHEST  2 VIEW  COMPARISON:  02/04/2015 chest radiograph  FINDINGS: A right PICC terminates in the lower third of the superior vena cava. Stable cardiomediastinal silhouette with normal heart size. No pneumothorax. There are stable small bilateral pleural effusions. There is no appreciable change in the large cavitary left upper lobe mass with extensive surrounding patchy consolidation. There is stable  mild hazy opacities in the left lower lobe. No new focal lung consolidation. No pulmonary edema.  IMPRESSION: 1. Stable large left upper lobe cavitary mass with surrounding consolidation. Differential includes tuberculosis, fungal pneumonia and/or underlying neoplasm. Continued post treatment chest imaging follow-up is advised. 2. Stable mild hazy opacities in the left lower lobe. 3. Stable small bilateral pleural effusions.   Electronically Signed   By: Ilona Sorrel M.D.   On: 02/08/2015 07:19     ASSESSMENT AND PLAN:   59 year old male who presents with acute respiratory failure secondary to left upper lobe cavitation with septic shock.  1. Acute hypoxic respiratory failure: This is secondary to left upper lobe cavitation fungi infection: We are currently ruling out tuberculosis. AFBs are still pending. disontinued Zosyn and vancomycin by Dr. Ola Spurr yesterday. His sputum culture showed mold and therefore was started on voriconazole on September 22. Blood cultures are negative to date. He will require voriconazole lifelong as per Dr. 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   Per Dr. Genevive Bi,  follow-up at a tertiary care center for this particular issue.   2. Septic shock: This is secondary to left upper lobe cavitation.  Improved.  3. Atrial fibrillation RVR: This is likely secondary to problem #1 and 2. 2-D echocardiogram which showed normal ejection fraction and no major valvular abnormalities and/or endocarditis. Patient is off amiodarone drip and on by mouth amiodarone. Per Dr. Nehemiah Massed, continue current treatment.  4. Elevated troponin: It is reported that the second troponin is 2.59 and then subsequently 0.05 I believes that there is this probably an error.   5. Hypokalemia: Potassium is better, Give potassium supplement and follow-up BMP. Magnesium level is normal. 6. Leukocytosis: Likely secondary to pneumonia and cavitary lesion. Improving, follow-up  CBC.  7. Diarrhea: C. difficile negative.   Anemia of chronic disease. Hemoglobin is stable. Malnutrition. BMI 15. Encourage oral intake and Follow-up dietitian.  *acute bilateral arm DVT. I discussed with the Dr. Ola Spurr, the patient has high risk for bleeding from infected the cavity of the lung, no anticoagulation at this time, change to po voriconazole and remove the PICC line per Dr. Ola Spurr.  Management plans discussed with the patient and he is in agreement.   CODE STATUS: Full    Per PT evaluation, the patient the need home PT. I discussed with Dr. Genevive Bi and Dr. Ola Spurr.   TOTAL TIME TAKING CARE OF THIS PATIENT: 38 minutes.     POSSIBLE D/C ??, DEPENDING ON CLINICAL CONDITION.   Demetrios Loll M.D on 02/08/2015 at 1:41 PM  Between 7am to 6pm - Pager - [phone] After 6pm go to [email or web address] - password [email or web address] [facility]  [facility]  Office  [phone]  CC: Primary care physician; No primary care provider on file.

## 2015-02-08 NOTE — Progress Notes (Signed)
Spoke with Dr Bridgett Larsson face to face at or around 1030 regarding patients bilateral upper extremity swelling. My concerns were acknowledged.

## 2015-02-08 NOTE — Progress Notes (Signed)
Nutrition Follow-up  DOCUMENTATION CODES:   Severe malnutrition in context of chronic illness  INTERVENTION:   Meals and Snacks: Cater to patient preferences Medical Food Supplement Therapy: Continue Ensure as ordered as pt drinking will add Magic Cup as well.   NUTRITION DIAGNOSIS:   Inadequate oral intake related to acute illness as evidenced by percent weight loss.  GOAL:   Patient will meet greater than or equal to 90% of their needs; ongoing  MONITOR:    (Energy intake, Anthropometric, electrolyte and renal profile)  REASON FOR ASSESSMENT:   Other (Comment) (BMI of 15)    ASSESSMENT:   Per MD note, pt will require voriconazole lifelong as per Dr. 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 Per MD Genevive Bi, pt to follow-up teritary care facility oupatient. Pt now off airborne isolation as TB has been ruled out.   Past Medical History  Diagnosis Date  . Seizures    Diet Order:  Diet regular Room service appropriate?: Yes; Fluid consistency:: Thin    Current Nutrition: Pt reports eating very well this am, eggs hash browns and bacon with milk and 100% of Ensure. Pt reports really liking Ensures. Recorded po intake 63% of meals on average. Pt reports PTA eating 3 'small' meals per day. Per RN Lambert Keto pt mentioned that father checks on pt daily to make sure he has eaten something.    Gastrointestinal Profile: Last BM: multiple loose stools this am   Medications: MVI, thiamine, KCl, NS at 88PJ/SR, folic acid,  Electrolyte/Renal Profile and Glucose Profile:   Recent Labs Lab 02/04/15 0559 02/05/15 0500 02/06/15 0517 02/07/15 0441 02/08/15 0759  NA 139 139 138 139 139  K 3.0* 3.1* 2.9* 3.2* 3.5  CL 111 107 108 110 111  CO2 25 25 24 24 22   BUN 10 7 8 8 8   CREATININE 0.61 0.72 0.75 0.77 0.69  CALCIUM 7.6* 7.5* 7.2* 7.2* 7.5*  MG 2.0 1.7 1.8  --   --   PHOS 2.0* 2.2* 2.9  --   --   GLUCOSE 114* 88 100* 103* 92   Protein Profile:   Recent Labs Lab 02/02/15 1221  ALBUMIN 1.5*     Nutrition-Focused Physical Exam Findings: Nutrition-Focused physical exam completed. Findings are severe fat depletion, severe muscle depletion, and non-pitting edema per Nsg. RD notes edema in lower upper extremities and fingers.    Weight Trend since Admission: Filed Weights   02/01/15 0719 02/01/15 1220  Weight: 114 lb (51.71 kg) 103 lb 9.9 oz (47 kg)     Ideal Body Weight:   73kg  BMI:  Body mass index is 15.29 kg/(m^2).  Estimated Nutritional Needs:   Kcal:  BEE 1545 kcals (IF 1.1-1.3, AF 1.3) 2209-2611 kcals/d. Using IBW of 73kg  Protein:  (1.1-1.3 g/kg) 80-95 g/d  Fluid:  (30-75ml/kg) 1594-5859 ml/d  EDUCATION NEEDS:   No education needs identified at this time   Jansen, RD, LDN Pager [phone]

## 2015-02-08 NOTE — Progress Notes (Signed)
At or around 1600 spoke with Dr Bridgett Larsson regarding patients iv Voriconazole order and the need to have it dc from patients record. He stated Dr Ola Spurr was to change med to PO. I asked in the interim can we please dc it because there is a dose due and there is order to pull PICC line due to new DVT. Order was given to give dose then pull PICC. Spoke with Dr Ola Spurr right after my conversation with Dr Bridgett Larsson and he ordered to go ahead and give med but to call pharmacy and ask if it can be given faster. Called Pharmacy spoke with pharmacist Lenna Sciara, she told me i could give med as fast as 74 ml an hour Med was hung and given with out incident PICC line pulled pt tolerated well.

## 2015-02-08 NOTE — Progress Notes (Addendum)
Pharmacy Consult for Electrolyte Management  No Known Allergies  Patient Measurements: Height: 5\' 9"  (175.3 cm) Weight: 103 lb 9.9 oz (47 kg) IBW/kg (Calculated) : 70.7  Vital Signs: Temp: 98.8 F (37.1 C) (09/27 0743) Temp Source: Oral (09/27 0743) BP: 104/58 mmHg (09/27 0743) Pulse Rate: 129 (09/27 1024) Intake/Output from previous day: 09/26 0701 - 09/27 0700 In: 2877.2 [P.O.:1320; I.V.:1119.2; IV Piggyback:438] Out: 700 [Urine:700] Intake/Output from this shift: Total I/O In: 480 [P.O.:480] Out: -   Labs:  Recent Labs  02/06/15 0830 02/07/15 0441 02/08/15 0759  WBC 17.9* 16.9* 16.0*  HGB 7.6* 8.0* 8.4*  HCT 23.4* 24.3* 25.2*  PLT 384 475* 514*     Recent Labs  02/06/15 0517 02/07/15 0441 02/08/15 0759  NA 138 139 139  K 2.9* 3.2* 3.5  CL 108 110 111  CO2 24 24 22   GLUCOSE 100* 103* 92  BUN 8 8 8   CREATININE 0.75 0.77 0.69  CALCIUM 7.2* 7.2* 7.5*  MG 1.8  --   --   PHOS 2.9  --   --    Estimated Creatinine Clearance: 67.7 mL/min (by C-G formula based on Cr of 0.69).   No results for input(s): GLUCAP in the last 72 hours.  Medical History: Past Medical History  Diagnosis Date  . Seizures     Medications:  Scheduled:  . amiodarone  200 mg Oral Daily  . citalopram  20 mg Oral [id number]  . dextromethorphan  30 mg Oral BID   And  . guaiFENesin  600 mg Oral BID  . enoxaparin (LOVENOX) injection  40 mg Subcutaneous Q24H  . feeding supplement (ENSURE ENLIVE)  237 mL Oral TID  . folic acid  1 mg Oral Daily  . multivitamin with minerals  1 tablet Oral Daily  . nicotine  21 mg Transdermal Daily  . potassium chloride  20 mEq Oral TID WC  . primidone  200 mg Oral QHS  . sodium chloride  3 mL Intravenous Q12H  . thiamine  100 mg Oral Daily  . topiramate  25 mg Oral BID  . voriconazole (VFEND - PT WT </= 85 kg) IV  4 mg/kg Intravenous Q12H   Infusions:  . sodium chloride 50 mL/hr at 02/08/15 0432   PRN: acetaminophen **OR** acetaminophen,  HYDROcodone-acetaminophen, LORazepam, senna-docusate  Assessment: Pharmacy consulted to assist in managing electrolytes in this 59 y/o M with acute respiratory failure due to LUL cavitary PNA.   Plan:  Electrolytes acceptable this AM. No supplementation warranted.  Patient currently ordered KCl 20 meq TID.  Will recheck labs in AM. Will continue to follow.  Scarpena,Crystal G 02/08/2015,12:37 PM

## 2015-02-08 NOTE — Progress Notes (Signed)
Unable to draw blood labs this am. Will try phlebotimist

## 2015-02-08 NOTE — Progress Notes (Signed)
Patient walked around nurses station today with PT. Clinical Education officer, museum (CSW) left a Passenger transport manager to assist patient in applying for disability and medicaid. CSW will continue to follow and assist as needed.   Blima Rich, [place] [phone]

## 2015-02-08 NOTE — Consult Note (Signed)
[facility] Consultation     ASSESSMENT/PLAN   59 year old male with acute respiratory failure likely secondary to Left-sided pneumonia. Complicated by septic shock with type II myocardial infarction due to demand ischemia, and atrial fibrillation with rapid ventricular rate.  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 P:   -Currently. All sputum AFBs are pending. We'll await results, if negative, can DC isolation. -Sputum specimen shows mold, which is been sent to the state for further isolation. We'll also send sputum for silver stain. -Continue broad-spectrum antibiotics, as well as voriconazole. We'll consider surgical consultation. Once the patient is more stable. -Bilateral pleural effusions, greater on the left, suspect secondary to recent episode of sepsis with aggressive fluid rehydration. -Discontinue steroids. -infectious disease is also following along, appreciate their input.  - Patient will follow up with Dr. Ashby Dawes at [facility], [place], 2-3 weeks after discharge.   CARDIOVASCULAR  A: Type II, myocardial infarction, likely stress-induced ischemia. Atrial fibrillation with RVR. Hypotension due to septic shock and atrial fibrillation. P:  -Continue amiodarone.  RENAL A:  Kidney function appears to be stable at this time. P:   Appears improved, will decrease IV fluids.  GASTROINTESTINAL Continue by mouth diet.  HEMATOLOGIC A:  Leukocytosis, with left shift, likely due to pneumonia with sepsis, as well as steroids. P:  Continue to monitor.  INFECTIOUS A:  Pneumonia with sepsis. Infectious disease services, following. -Rule out TB. P:    Sputum 02/01/2015; sputum culture 2, and sputum for AFB are all pending at this time. We'll continue to check sputum AFB daily 3 total. Abx: Vancomycin, Zosyn, will continue per infectious disease recommendations.  Discontinue steroids, given presence of possible aspergillosis.  ENDOCRINE --  NEUROLOGIC --  --Right upper extremity dual lumen PICC line placed 02/02/2015.  --Patient would be appear to be stable to transfer out of the intensive care unit.  ---------------------------------------  ---------------------------------------   Name: Jonathan [name] MRN: [id number] DOB: 1955/07/20    ADMISSION DATE:  02/01/2015 CONSULTATION DATE:  02/02/2015.  REFERRING MD :  Dr. Benjie Karvonen  CHIEF COMPLAINT:  Dyspnea.    HISTORY OF PRESENT ILLNESS:    Patient has no new complaints today. He appears to be looking and feeling better today.   REVIEW OF SYSTEMS:   Constitutional: Feels well. Cardiovascular: No chest pain.  Pulmonary: Denies dyspnea.   The remainder of systems were reviewed and were found to be negative other than what is documented in the HPI.    VITAL SIGNS: Temp:  [98.2 F (36.8 C)-99.5 F (37.5 C)] 98.8 F (37.1 C) (09/27 0743) Pulse Rate:  [100-129] 129 (09/27 1024) Resp:  [16-18] 18 (09/27 0743) BP: (99-112)/(53-66) 104/58 mmHg (09/27 0743) SpO2:  [94 %-97 %] 95 % (09/27 1024) HEMODYNAMICS:   VENTILATOR SETTINGS:   INTAKE / OUTPUT:  Intake/Output Summary (Last 24 hours) at 02/08/15 1107 Last data filed at 02/08/15 0800  Gross per 24 hour  Intake 2757.17 ml  Output    700 ml  Net 2057.17 ml    Physical Examination:   VS: BP 104/58 mmHg  Pulse 129  Temp(Src) 98.8 F (37.1 C) (Oral)  Resp 18  Ht 5\' 9"  (1.753 m)  Wt 103 lb 9.9 oz (47 kg)  BMI 15.29 kg/m2  SpO2 95%  General Appearance: No distress  Neuro:without focal findings,  HEENT: PERRLA, EOM intact, no ptosis, no other lesions noticed;  Pulmonary: normal breath sounds., diaphragmatic excursion normal.No wheezing, No  rales;     CardiovascularNormal S1,S2.  No m/r/g.    Abdomen: Benign, Soft, non-tender, No masses, hepatosplenomegaly, No lymphadenopathy Renal:  No costovertebral tenderness  GU:   Not performed at this time. Endoc: No evident thyromegaly, no signs of acromegaly. Skin:   warm, no rashes, no ecchymosis  Extremities: normal, no cyanosis, clubbing, no edema, warm with normal capillary refill.    LABS: Reviewed   LABORATORY PANEL:   CBC  Recent Labs Lab 02/08/15 0759  WBC 16.0*  HGB 8.4*  HCT 25.2*  PLT 514*    Chemistries   Recent Labs Lab 02/02/15 1221  02/06/15 0517  02/08/15 0759  NA 133*  < > 138  < > 139  K 2.8*  < > 2.9*  < > 3.5  CL 105  < > 108  < > 111  CO2 21*  < > 24  < > 22  GLUCOSE 127*  < > 100*  < > 92  BUN 11  < > 8  < > 8  CREATININE 0.69  < > 0.75  < > 0.69  CALCIUM 7.1*  < > 7.2*  < > 7.5*  MG  --   < > 1.8  --   --   PHOS  --   < > 2.9  --   --   AST 49*  --   --   --   --   ALT 20  --   --   --   --   ALKPHOS 80  --   --   --   --   BILITOT 0.4  --   --   --   --   < > = values in this interval not displayed.   Recent Labs Lab 02/01/15 1214  GLUCAP 99   No results for input(s): PHART, PCO2ART, PO2ART in the last 168 hours.  Recent Labs Lab 02/02/15 1221  AST 49*  ALT 20  ALKPHOS 80  BILITOT 0.4  ALBUMIN 1.5*    Cardiac Enzymes  Recent Labs Lab 02/02/15 1221  TROPONINI 0.04*    RADIOLOGY:  Dg Chest 2 View  02/08/2015   CLINICAL DATA:  Follow-up pneumonia, shortness of breath and sepsis.  EXAM: CHEST  2 VIEW  COMPARISON:  02/04/2015 chest radiograph  FINDINGS: A right PICC terminates in the lower third of the superior vena cava. Stable cardiomediastinal silhouette with normal heart size. No pneumothorax. There are stable small bilateral pleural effusions. There is no appreciable change in the large cavitary left upper lobe mass with extensive surrounding patchy consolidation. There is stable mild hazy opacities in the left lower lobe. No new focal lung consolidation. No pulmonary edema.  IMPRESSION: 1. Stable large left upper lobe cavitary mass with surrounding consolidation. Differential includes  tuberculosis, fungal pneumonia and/or underlying neoplasm. Continued post treatment chest imaging follow-up is advised. 2. Stable mild hazy opacities in the left lower lobe. 3. Stable small bilateral pleural effusions.   Electronically Signed   By: Ilona Sorrel M.D.   On: 02/08/2015 07:19      I have personally obtained a history, examined the patient, evaluated laboratory and imaging results, formulated the assessment and plan and placed orders. Pulmonary Consult Pulmonary consult Time devoted to patient care services described in this note is 45 minutes.    Vilinda Boehringer, MD [facility] Pager [phone] (please enter 7-digits) On Call Pager - [phone] (please enter 7-digits)  02/08/2015, 11:07 AM

## 2015-02-08 NOTE — Evaluation (Signed)
Physical Therapy Evaluation Patient Details Name: Jonathan [name] MRN: [id number] DOB: 1956/03/17 Today's Date: 02/08/2015   History of Present Illness  presented to ER secondary, hypoxia; admitted with L lower lobe consolidation consistent with fungal, necrotizing pneumonia.  Initially, with concern for possible TB; all AFBs negative.  Clinical Impression  Upon evaluation, patient alert and oriented; follows all commands and demonstrates fair insight/safety awareness.  Bilat UE/LE strength and ROM grossly WFL and symmetrical, functional for basic transfers and mobility.  Demonstrates ability to complete bed mobility indep; sit/stand, basic transfers and gait (220') without assist device, cga/close sup.  Fair dynamic balance with functional reach approx 4" and slow, but steady, gait speed with 10' walk time, 9 seconds; both decreased compared to age-matched norms and indicative of increased fall risk with higher level balance activities.  BORG rating 7-8/10 with minimal activity, suggestive of significant cardiopulmonary endurance deficits. Would benefit from skilled PT to address above deficits and promote optimal return to PLOF; Recommend transition to [facility] upon discharge from acute hospitalization. Also encouraged patient OOB for all meals and ambulation with RN 2-3x/day during remaining hospitalization to maintain strength/functional endurance during remaining hospitalization; patient voiced agreement/understanding.    Follow Up Recommendations Home health PT (may benefit from pulmonary rehab when appropriate)    Equipment Recommendations       Recommendations for Other Services       Precautions / Restrictions Precautions Precautions: Fall Restrictions Weight Bearing Restrictions: No      Mobility  Bed Mobility Overal bed mobility: Independent                Transfers Overall transfer level: Needs assistance Equipment used: None Transfers: Sit to/from Stand Sit to  Stand: Min guard         General transfer comment: minimal use of bilat UEs to assist wtih lift off  Ambulation/Gait Ambulation/Gait assistance: Min guard Ambulation Distance (Feet): 220 Feet Assistive device: None   Gait velocity: 10' walk time, 9 seconds   General Gait Details: reciprocal stepping pattern with fair step height/length; limited trunk rotation and arm swing.  Slow, but fairly, steady cadence and overall gait speed without overt buckling or LOB.  Mod SOB with exertion, BORG 7-8/10 after above distance  Stairs            Wheelchair Mobility    Modified Rankin (Stroke Patients Only)       Balance Overall balance assessment: Needs assistance Sitting-balance support: No upper extremity supported;Feet supported Sitting balance-Leahy Scale: Good     Standing balance support: No upper extremity supported Standing balance-Leahy Scale: Fair                               Pertinent Vitals/Pain Pain Assessment: No/denies pain    Home Living Family/patient expects to be discharged to:: Private residence Living Arrangements: Non-relatives/Friends Available Help at Discharge: Friend(s) Type of Home: House       Home Layout: Able to live on main level with bedroom/bathroom;Two level (main level with basement) Home Equipment: None      Prior Function Level of Independence: Independent         Comments: Indep with household and limited community mobility without use of assist device     Hand Dominance        Extremity/Trunk Assessment   Upper Extremity Assessment: Overall WFL for tasks assessed           Lower Extremity Assessment:  Overall WFL for tasks assessed (globally at least 4-/5 throughout)         Communication   Communication: No difficulties  Cognition Arousal/Alertness: Awake/alert Behavior During Therapy: WFL for tasks assessed/performed;Flat affect Overall Cognitive Status: Within Functional Limits for tasks  assessed                      General Comments      Exercises Other Exercises Other Exercises: Toilet transfer, ambulatory without assist device, cga/close sup without assist device; sit/stand from standard toilet without assist device, cga; standing balance for clothing management, close sup; standing balance at sink for hand hygiene, close sup, with functional reach approx 4" noted. (8 min)      Assessment/Plan    PT Assessment Patient needs continued PT services  PT Diagnosis Difficulty walking;Generalized weakness   PT Problem List Decreased mobility;Decreased activity tolerance;Cardiopulmonary status limiting activity  PT Treatment Interventions Gait training;Stair training;Functional mobility training;Therapeutic activities;Therapeutic exercise;Balance training;Patient/family education   PT Goals (Current goals can be found in the Care Plan section) Acute Rehab PT Goals Patient Stated Goal: "to get to the bathroom" PT Goal Formulation: With patient Time For Goal Achievement: 02/22/15 Potential to Achieve Goals: Good    Frequency Min 2X/week   Barriers to discharge        Co-evaluation               End of Session Equipment Utilized During Treatment: Gait belt Activity Tolerance: Patient tolerated treatment well Patient left: in chair;with call bell/phone within reach;with bed alarm set;with family/visitor present           Time: 1749-4496 PT Time Calculation (min) (ACUTE ONLY): 23 min   Charges:   PT Evaluation $Initial PT Evaluation Tier I: 1 Procedure PT Treatments $Therapeutic Activity: 8-22 mins   PT G Codes:        Kristen H. Owens Shark, PT, DPT, NCS 02/08/2015, 10:35 AM [phone]

## 2015-02-08 NOTE — Consult Note (Signed)
Patient ID: Jonathan [name], male   DOB: 10-23-1955, 59 y.o.   MRN: [id number]  Chief Complaint  Patient presents with  . Psychiatric Evaluation  . Shortness of Breath    Referred By Dr. Juanda Crumble with him Reason for Referral left upper lobe abscess  HPI Location, Quality, Duration, Severity, Timing, Context, Modifying Factors, Associated Signs and Symptoms.  Jonathan [name] is a 59 y.o. male.  I have personally seen and examined this patient. He is a 59 year old gentleman with a long-standing history of tobacco abuse smoking 2 packs of cigarettes per day as well as a case of beer per week who presented to the emergency room with cough shortness of breath and fever. During the admission over the past several days he was found to have a large left upper lobe cavitary mass most consistent with necrotic and necrotizing lung parenchyma and associated fungal infection (fungus ball). To date cultures have grown mold and he is on appropriate therapy. The patient states that he's been sick for several months. He's lost approximately 50 pounds. He's had a cough and shortness of breath but has had no examination. He did see his neurologist earlier this month but no blood work or x-rays were obtained. The patient lives alone and is currently uninsured and therefore has limited access to medical care. He states that he has never had a chest x-ray or CT scan. He does state that he gets short of breath with minimal activities. He also complains of a thick productive cough of some brown tinged sputum but no frank hemoptysis.   Past Medical History  Diagnosis Date  . Seizures     History reviewed. No pertinent past surgical history.  History reviewed. No pertinent family history.  Social History Social History  Substance Use Topics  . Smoking status: Current Every Day Smoker  . Smokeless tobacco: None  . Alcohol Use: None    No Known Allergies  Current Facility-Administered Medications  Medication  Dose Route Frequency Provider Last Rate Last Dose  . 0.9 %  sodium chloride infusion   Intravenous Continuous Laverle Hobby, MD 50 mL/hr at 02/08/15 0432    . acetaminophen (TYLENOL) tablet 650 mg  650 mg Oral Q6H PRN Bettey Costa, MD       Or  . acetaminophen (TYLENOL) suppository 650 mg  650 mg Rectal Q6H PRN Bettey Costa, MD      . amiodarone (PACERONE) tablet 200 mg  200 mg Oral Daily Laverle Hobby, MD   200 mg at 02/08/15 0811  . citalopram (CELEXA) tablet 20 mg  20 mg Oral Oretha Ellis, MD   20 mg at 02/08/15 0811  . dextromethorphan (DELSYM) 30 MG/5ML liquid 30 mg  30 mg Oral BID Bettey Costa, MD   30 mg at 02/08/15 7341   And  . guaiFENesin (MUCINEX) 12 hr tablet 600 mg  600 mg Oral BID Bettey Costa, MD   600 mg at 02/08/15 0811  . enoxaparin (LOVENOX) injection 40 mg  40 mg Subcutaneous Q24H Sital Mody, MD   40 mg at 02/08/15 1224  . feeding supplement (ENSURE ENLIVE) (ENSURE ENLIVE) liquid 237 mL  237 mL Oral TID Bettey Costa, MD   237 mL at 02/08/15 1224  . folic acid (FOLVITE) tablet 1 mg  1 mg Oral Daily Bettey Costa, MD   1 mg at 02/08/15 0811  . HYDROcodone-acetaminophen (NORCO/VICODIN) 5-325 MG per tablet 1-2 tablet  1-2 tablet Oral Q4H PRN Bettey Costa, MD      .  LORazepam (ATIVAN) injection 1-2 mg  1-2 mg Intravenous Q1H PRN Bettey Costa, MD   2 mg at 02/06/15 0030  . multivitamin with minerals tablet 1 tablet  1 tablet Oral Daily Bettey Costa, MD   1 tablet at 02/08/15 0811  . nicotine (NICODERM CQ - dosed in mg/24 hours) patch 21 mg  21 mg Transdermal Daily Bettey Costa, MD   21 mg at 02/08/15 0812  . potassium chloride SA (K-DUR,KLOR-CON) CR tablet 20 mEq  20 mEq Oral TID WC Laverle Hobby, MD   20 mEq at 02/08/15 1224  . primidone (MYSOLINE) tablet 200 mg  200 mg Oral QHS Bettey Costa, MD   200 mg at 02/07/15 2216  . senna-docusate (Senokot-S) tablet 1 tablet  1 tablet Oral QHS PRN Bettey Costa, MD      . sodium chloride 0.9 % injection 3 mL  3 mL Intravenous Q12H Bettey Costa, MD   3 mL at 02/07/15 2216  . thiamine (VITAMIN B-1) tablet 100 mg  100 mg Oral Daily Bettey Costa, MD   100 mg at 02/08/15 0811  . topiramate (TOPAMAX) tablet 25 mg  25 mg Oral BID Bettey Costa, MD   25 mg at 02/08/15 0811  . voriconazole (VFEND) 190 mg in sodium chloride 0.9 % 100 mL IVPB  4 mg/kg Intravenous Q12H Laverle Hobby, MD   190 mg at 02/08/15 0433      Review of Systems A complete review of systems was asked and was negative except for the following positive findings cough, shortness of breath, productive cough, 50 pound weight loss  Blood pressure 104/58, pulse 129, temperature 98.8 F (37.1 C), temperature source Oral, resp. rate 18, height 5\' 9"  (1.753 m), weight 103 lb 9.9 oz (47 kg), SpO2 95 %.  Physical Exam CONSTITUTIONAL:  Pleasant, well-developed, well-nourished, and in no acute distress. EYES: Pupils equal and reactive to light, Sclera non-icteric EARS, NOSE, MOUTH AND THROAT:  The oropharynx was clear.  Dentition is in poor repair.  Oral mucosa pink and moist. LYMPH NODES:  Lymph nodes in the neck and axillae were normal RESPIRATORY:  Lungs were rhonchorous.  Normal respiratory effort without pathologic use of accessory muscles of respiration CARDIOVASCULAR: Heart was regular without murmurs.  There were no carotid bruits. GI: The abdomen was soft, nontender, and nondistended. There were no palpable masses. There was no hepatosplenomegaly. There were normal bowel sounds in all quadrants. GU:  Rectal deferred.   MUSCULOSKELETAL:  Normal muscle strength and tone.  No clubbing or cyanosis.   SKIN:  There were no pathologic skin lesions.  There were no nodules on palpation. NEUROLOGIC:  Sensation is normal.  Cranial nerves are grossly intact. PSYCH:  Oriented to person, place and time.  Mood and affect are normal.  Data Reviewed CT scan and chest x-ray  I have personally reviewed the patient's imaging, laboratory findings and medical records.     Assessment/Plan    I have discussed his care with Dr. Vanessa Ralphs in interventional radiology. He did not believe that any percutaneous management of this will be possible. I believe that he is likely require thoracotomy with left upper lobe debridement and muscle flap transposition into the chest cavity for long-term management. Because of the intensity of service this will require we are unable provide that service here and I would recommend appropriate follow-up at a tertiary care center for this particular issue. The patient has an established neurologist in [facility] and it may be appropriate to send the  patient to that facility.    Plan    Further evaluation by thoracic surgery at a tertiary care facility.        Nestor Lewandowsky, MD 02/08/2015, 12:53 PM

## 2015-02-09 DIAGNOSIS — R0600 Dyspnea, unspecified: Secondary | ICD-10-CM

## 2015-02-09 DIAGNOSIS — B449 Aspergillosis, unspecified: Secondary | ICD-10-CM

## 2015-02-09 LAB — CBC
HEMATOCRIT: 24.2 % — AB (ref 40.0–52.0)
HEMOGLOBIN: 7.8 g/dL — AB (ref 13.0–18.0)
MCH: 30.1 pg (ref 26.0–34.0)
MCHC: 32.2 g/dL (ref 32.0–36.0)
MCV: 93.7 fL (ref 80.0–100.0)
PLATELETS: 526 10*3/uL — AB (ref 150–440)
RBC: 2.58 MIL/uL — AB (ref 4.40–5.90)
RDW: 15.4 % — ABNORMAL HIGH (ref 11.5–14.5)
WBC: 16.3 10*3/uL — AB (ref 3.8–10.6)

## 2015-02-09 LAB — BASIC METABOLIC PANEL
ANION GAP: 6 (ref 5–15)
BUN: 9 mg/dL (ref 6–20)
CHLORIDE: 112 mmol/L — AB (ref 101–111)
CO2: 20 mmol/L — AB (ref 22–32)
Calcium: 7.6 mg/dL — ABNORMAL LOW (ref 8.9–10.3)
Creatinine, Ser: 0.62 mg/dL (ref 0.61–1.24)
GFR calc non Af Amer: >60 mL/min (ref >60)
Glucose, Bld: 80 mg/dL (ref 65–99)
POTASSIUM: 3.8 mmol/L (ref 3.5–5.1)
Sodium: 138 mmol/L (ref 135–145)

## 2015-02-09 LAB — MAGNESIUM: Magnesium: 1.9 mg/dL (ref 1.7–2.4)

## 2015-02-09 MED ORDER — VORICONAZOLE 200 MG PO TABS
200.0000 mg | ORAL_TABLET | Freq: Two times a day (BID) | ORAL | Status: DC
  Administered 2015-02-09 – 2015-02-10 (×3): 200 mg via ORAL
  Filled 2015-02-09 (×4): qty 1

## 2015-02-09 MED ORDER — VORICONAZOLE 200 MG PO TABS
200.0000 mg | ORAL_TABLET | Freq: Two times a day (BID) | ORAL | Status: DC
  Filled 2015-02-09 (×2): qty 1

## 2015-02-09 NOTE — Progress Notes (Signed)
PT Cancellation Note  Patient Details Name: Jonathan [name] MRN: [id number] DOB: 04-21-1956   Cancelled Treatment:    Reason Eval/Treat Not Completed: Medical issues which prohibited therapy (Patient noted with bilat UE DVT; no additional anticoagulation noted at this time.  Will hold activity/mobility at this time until patient cleared for further activity.)   Kristen H. Owens Shark, PT, DPT, NCS 02/09/2015, 10:35 AM [phone]

## 2015-02-09 NOTE — Consult Note (Signed)
[facility] Consultation     ASSESSMENT/PLAN   59 year old male with acute respiratory failure likely secondary to Left-sided pneumonia. Complicated by septic shock with type II myocardial infarction due to demand ischemia, and atrial fibrillation with rapid ventricular rate.  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 P:   -Currently AFB sputum cx are pending (prelim = neg) -Sputum specimen shows mold, which is been sent to the state for further isolation and silver stain. -Continue broad-spectrum antibiotics, as well as voriconazole. - patient will require surgical intervention, however, based on CTS note, this surgery will be extensive and should be performed at a tertiary center. Agree with recs.  -Bilateral pleural effusions, greater on the left, suspect secondary to recent episode of sepsis with aggressive fluid rehydration.  -infectious disease is also following along, appreciate their input.  - Patient will follow up with Dr. Ashby Dawes at [facility], [place], 2-3 weeks after discharge.   CARDIOVASCULAR  A: Type II, myocardial infarction, likely stress-induced ischemia. Atrial fibrillation with RVR. Hypotension due to septic shock and atrial fibrillation. P:  -Continue amiodarone.  RENAL A:  Kidney function appears to be stable at this time. P:   Appears improved, will decrease IV fluids.  GASTROINTESTINAL Continue by mouth diet.  HEMATOLOGIC A:  Leukocytosis, with left shift, likely due to pneumonia with sepsis, as well as steroids. UEDVT P:  -Continue to monitor. - Right partially occlusive DVT (brachial, axillary and subcl veins) also nonocclussive mural thrombus in the left brachial and basilic veins - PICC has been pulled - limited evidence for treating UEDVT, especially if provoked by catheter - may consider Xarelto for 3 months if there are no absolute  contraindications (hemoptsis, upcoming surgery, GI bleed, recent CVA, etc).   INFECTIOUS A:  Pneumonia with sepsis now resolving. Infectious disease services, following. P:    Sputum 02/01/2015; sputum culture 2, and sputum for AFB are all pending at this time.sputum AFB daily 3 total performed.  Abx: Vancomycin, Zosyn, will continue per infectious disease recommendations. Discontinue steroids, given presence of possible aspergillosis.  ENDOCRINE --  NEUROLOGIC --  Lines --Right upper extremity dual lumen PICC line placed 02/02/2015 removed on 02/08/15 due to UEDVT  We will signoff at this time, please contact us with any questions or concerns.  Thank you for consulting [facility].    Name: Jonathan [name] MRN: [id number] DOB: [birth year]    ADMISSION DATE:  02/01/2015 CONSULTATION DATE:  02/02/2015.  REFERRING MD :  Dr. Benjie Karvonen  CHIEF COMPLAINT:  Dyspnea.    HISTORY OF PRESENT ILLNESS:    Patient has no new complaints today. He appears to be looking and feeling better today.   REVIEW OF SYSTEMS:   Constitutional: Feels well. Cardiovascular: No chest pain.  Pulmonary: Denies dyspnea.   The remainder of systems were reviewed and were found to be negative other than what is documented in the HPI.    VITAL SIGNS: Temp:  [98.1 F (36.7 C)-99.6 F (37.6 C)] 98.6 F (37 C) (09/28 0810) Pulse Rate:  [95-129] 95 (09/28 0810) Resp:  [18-20] 18 (09/28 0810) BP: (96-106)/(42-59) 99/55 mmHg (09/28 0810) SpO2:  [95 %-98 %] 95 % (09/28 0810) HEMODYNAMICS:   VENTILATOR SETTINGS:   INTAKE / OUTPUT:  Intake/Output Summary (Last 24 hours) at 02/09/15 0818 Last data filed at 02/09/15 0500  Gross per 24 hour  Intake  949.5 ml  Output      0  ml  Net  949.5 ml    Physical Examination:   VS: BP 99/55 mmHg  Pulse 95  Temp(Src) 98.6 F (37 C) (Oral)  Resp 18  Ht 5\' 9"  (1.753 m)  Wt 103 lb 9.9 oz (47 kg)  BMI 15.29 kg/m2  SpO2 95%  General  Appearance: No distress  Neuro:without focal findings,  HEENT: PERRLA, EOM intact, no ptosis, no other lesions noticed;  Pulmonary: normal breath sounds., diaphragmatic excursion normal.No wheezing, No rales;     CardiovascularNormal S1,S2.  No m/r/g.    Abdomen: Benign, Soft, non-tender, No masses, hepatosplenomegaly, No lymphadenopathy Renal:  No costovertebral tenderness  GU:  Not performed at this time. Endoc: No evident thyromegaly, no signs of acromegaly. Skin:   warm, no rashes, no ecchymosis  Extremities: normal, no cyanosis, clubbing, no edema, warm with normal capillary refill.    LABS: Reviewed   LABORATORY PANEL:   CBC  Recent Labs Lab 02/09/15 0547  WBC 16.3*  HGB 7.8*  HCT 24.2*  PLT 526*    Chemistries   Recent Labs Lab 02/02/15 1221  02/06/15 0517  02/09/15 0547  NA 133*  < > 138  < > 138  K 2.8*  < > 2.9*  < > 3.8  CL 105  < > 108  < > 112*  CO2 21*  < > 24  < > 20*  GLUCOSE 127*  < > 100*  < > 80  BUN 11  < > 8  < > 9  CREATININE 0.69  < > 0.75  < > 0.62  CALCIUM 7.1*  < > 7.2*  < > 7.6*  MG  --   < > 1.8  --  1.9  PHOS  --   < > 2.9  --   --   AST 49*  --   --   --   --   ALT 20  --   --   --   --   ALKPHOS 80  --   --   --   --   BILITOT 0.4  --   --   --   --   < > = values in this interval not displayed.  No results for input(s): GLUCAP in the last 168 hours. No results for input(s): PHART, PCO2ART, PO2ART in the last 168 hours.  Recent Labs Lab 02/02/15 1221  AST 49*  ALT 20  ALKPHOS 80  BILITOT 0.4  ALBUMIN 1.5*    Cardiac Enzymes  Recent Labs Lab 02/02/15 1221  TROPONINI 0.04*    RADIOLOGY:  Dg Chest 2 View  02/08/2015   CLINICAL DATA:  Follow-up pneumonia, shortness of breath and sepsis.  EXAM: CHEST  2 VIEW  COMPARISON:  02/04/2015 chest radiograph  FINDINGS: A right PICC terminates in the lower third of the superior vena cava. Stable cardiomediastinal silhouette with normal heart size. No pneumothorax. There are  stable small bilateral pleural effusions. There is no appreciable change in the large cavitary left upper lobe mass with extensive surrounding patchy consolidation. There is stable mild hazy opacities in the left lower lobe. No new focal lung consolidation. No pulmonary edema.  IMPRESSION: 1. Stable large left upper lobe cavitary mass with surrounding consolidation. Differential includes tuberculosis, fungal pneumonia and/or underlying neoplasm. Continued post treatment chest imaging follow-up is advised. 2. Stable mild hazy opacities in the left lower lobe. 3. Stable small bilateral pleural effusions.   Electronically Signed   By: Ilona Sorrel M.D.   On:  02/08/2015 07:19   US Venous Img Upper Bilat  02/08/2015   CLINICAL DATA:  Arm edema, x1 week.  Right arm PICC line.  EXAM: RIGHT UPPER EXTREMITY VENOUS DOPPLER ULTRASOUND  TECHNIQUE: Gray-scale sonography with graded compression, as well as color Doppler and duplex ultrasound were performed to evaluate the upper extremity deep venous system from the level of the subclavian vein and including the jugular, axillary, basilic and upper cephalic vein. Spectral Doppler was utilized to evaluate flow at rest and with distal augmentation maneuvers.  COMPARISON:  None.  FINDINGS: There is nonocclusive thrombus around the PICC line in the brachial, axillary, and subclavian veins. These veins are partially compressible. Unremarkable right jugular, cephalic, radial and ulnar veins. On the left the left IJ vein and subclavian vein unremarkable. Axillary vein shows normal compressibility. There is mural thrombus in a brachial vein which is partially compressible. There is some mural thrombus in the basilic vein with persistent flow. Cephalic, radial and ulnar veins remain patent.  IMPRESSION: 1. Right partially occlusive DVT involving brachial, axillary, and subclavian veins, adjacent to PICC catheter. 2. Nonocclusive mural thrombus in left brachial and basilic veins.    Electronically Signed   By: Lucrezia Europe M.D.   On: 02/08/2015 16:10    I have personally obtained a history, examined the patient, evaluated laboratory and imaging results, formulated the assessment and plan and placed orders. Pulmonary Consult Pulmonary consult Time devoted to patient care services described in this note is 35 minutes.    Vilinda Boehringer, MD [facility] Pager [phone] (please enter 7-digits) On Call Pager - [phone] (please enter 7-digits)    02/09/2015, 8:18 AM

## 2015-02-09 NOTE — Progress Notes (Signed)
   02/09/15 1443  Clinical Encounter Type  Visited With Patient  Visit Type Initial  Consult/Referral To Chaplain  Chaplain rounded in unit and offered pastoral care.   Marjory Lies [phone]

## 2015-02-09 NOTE — Progress Notes (Signed)
Clinical Education officer, museum (CSW) received call from [employer] counselor this morning. Amy was inquiring about patent's medical status and eligibility for disability Medicaid. CSW provided Amy with current medical status and plan. Per Amy she will assist patient and his father [name] as much as she can. CSW will continue to follow and as needed.   Blima Rich, [place] [phone]

## 2015-02-09 NOTE — Progress Notes (Addendum)
Pharmacy Consult for Electrolyte Management   No Known Allergies  Patient Measurements: Height: 5\' 9"  (175.3 cm) Weight: 103 lb 9.9 oz (47 kg) IBW/kg (Calculated) : 70.7  Vital Signs: Temp: 98.6 F (37 C) (09/28 0810) Temp Source: Oral (09/28 0810) BP: 99/55 mmHg (09/28 0810) Pulse Rate: 95 (09/28 0810) Intake/Output from previous day: 09/27 0701 - 09/28 0700 In: 1429.5 [P.O.:957; I.V.:472.5] Out: -  Intake/Output from this shift:    Labs:  Recent Labs  02/07/15 0441 02/08/15 0759 02/09/15 0547  WBC 16.9* 16.0* 16.3*  HGB 8.0* 8.4* 7.8*  HCT 24.3* 25.2* 24.2*  PLT 475* 514* 526*     Recent Labs  02/07/15 0441 02/08/15 0759 02/09/15 0547  NA 139 139 138  K 3.2* 3.5 3.8  CL 110 111 112*  CO2 24 22 20*  GLUCOSE 103* 92 80  BUN 8 8 9   CREATININE 0.77 0.69 0.62  CALCIUM 7.2* 7.5* 7.6*  MG  --   --  1.9   Estimated Creatinine Clearance: 67.7 mL/min (by C-G formula based on Cr of 0.62).   No results for input(s): GLUCAP in the last 72 hours.  Medical History: Past Medical History  Diagnosis Date  . Seizures     Medications:  Scheduled:  . amiodarone  200 mg Oral Daily  . citalopram  20 mg Oral BH-q7a  . dextromethorphan  30 mg Oral BID   And  . guaiFENesin  600 mg Oral BID  . enoxaparin (LOVENOX) injection  40 mg Subcutaneous Q24H  . feeding supplement (ENSURE ENLIVE)  237 mL Oral TID  . folic acid  1 mg Oral Daily  . multivitamin with minerals  1 tablet Oral Daily  . nicotine  21 mg Transdermal Daily  . potassium chloride  20 mEq Oral TID WC  . primidone  200 mg Oral QHS  . sodium chloride  3 mL Intravenous Q12H  . thiamine  100 mg Oral Daily  . topiramate  25 mg Oral BID  . voriconazole (VFEND - PT WT </= 85 kg) IV  4 mg/kg Intravenous Q12H   Infusions:    PRN: acetaminophen **OR** acetaminophen, HYDROcodone-acetaminophen, LORazepam, senna-docusate  Assessment: Pharmacy consulted to assist in managing electrolytes in this 59 y/o M with  acute respiratory failure due to LUL cavitary PNA.   Electrolytes WNL   Plan:  Electrolytes acceptable this AM. No supplementation warranted.  Patient currently ordered KCl 20 meq TID (not a home med).  Will recheck potassium in AM. Will continue to follow.  Scarpena,Crystal G 02/09/2015,10:00 AM

## 2015-02-09 NOTE — Progress Notes (Signed)
Voriconazole CONSULT NOTE - INITIAL  Pharmacy Consult for IV to PO transition Voriconazole Indication: Aspergillosis  No Known Allergies  Patient Measurements: Height: 5\' 9"  (175.3 cm) Weight: 103 lb 9.9 oz (47 kg) IBW/kg (Calculated) : 70.7   Vital Signs: Temp: 98.6 F (37 C) (09/28 0810) Temp Source: Oral (09/28 0810) BP: 99/55 mmHg (09/28 0810) Pulse Rate: 95 (09/28 0810) Intake/Output from previous day: 09/27 0701 - 09/28 0700 In: 1429.5 [P.O.:957; I.V.:472.5] Out: -  Intake/Output from this shift:    Labs:  Recent Labs  02/07/15 0441 02/08/15 0759 02/09/15 0547  WBC 16.9* 16.0* 16.3*  HGB 8.0* 8.4* 7.8*  PLT 475* 514* 526*  CREATININE 0.77 0.69 0.62   Estimated Creatinine Clearance: 67.7 mL/min (by C-G formula based on Cr of 0.62). No results for input(s): VANCOTROUGH, VANCOPEAK, VANCORANDOM, GENTTROUGH, GENTPEAK, GENTRANDOM, TOBRATROUGH, TOBRAPEAK, TOBRARND, AMIKACINPEAK, AMIKACINTROU, AMIKACIN in the last 72 hours.   Microbiology: Recent Results (from the past 720 hour(s))  Blood culture (routine x 2)     Status: None   Collection Time: 02/01/15  7:41 AM  Result Value Ref Range Status   Specimen Description BLOOD LEFT HAND  Final   Special Requests   Final    BOTTLES DRAWN AEROBIC AND ANAEROBIC 2 CC AERO 1 CC ANAERO   Culture NO GROWTH 5 DAYS  Final   Report Status 02/06/2015 FINAL  Final  Culture, expectorated sputum-assessment     Status: None   Collection Time: 02/01/15  7:42 AM  Result Value Ref Range Status   Specimen Description EXPECTORATED SPUTUM  Final   Special Requests Immunocompromised  Final   Sputum evaluation THIS SPECIMEN IS ACCEPTABLE FOR SPUTUM CULTURE  Final   Report Status 02/01/2015 FINAL  Final  Culture, respiratory (NON-Expectorated)     Status: None (Preliminary result)   Collection Time: 02/01/15  7:42 AM  Result Value Ref Range Status   Specimen Description EXPECTORATED SPUTUM  Final   Special Requests Immunocompromised  Reflexed from [id number]  Final   Gram Stain   Final    EXCELLENT SPECIMEN - 90-100% WBCS MANY WBC SEEN FEW GRAM NEGATIVE RODS MANY GRAM POSITIVE COCCI IN CHAINS    Culture MOLD SENT TO STATE FOR IDENTIFICATION  Final   Report Status PENDING  Incomplete  Blood culture (routine x 2)     Status: None   Collection Time: 02/01/15  7:43 AM  Result Value Ref Range Status   Specimen Description BLOOD RIGHT ARM  Final   Special Requests   Final    BOTTLES DRAWN AEROBIC AND ANAEROBIC 1 CC ANAERO 4 CC AERO   Culture NO GROWTH 5 DAYS  Final   Report Status 02/06/2015 FINAL  Final  Urine culture     Status: None   Collection Time: 02/01/15  8:35 AM  Result Value Ref Range Status   Specimen Description URINE, CLEAN CATCH  Final   Special Requests NONE  Final   Culture NO GROWTH 1 DAY  Final   Report Status 02/02/2015 FINAL  Final  MRSA PCR Screening     Status: None   Collection Time: 02/01/15 12:50 PM  Result Value Ref Range Status   MRSA by PCR NEGATIVE NEGATIVE Final    Comment:        The GeneXpert MRSA Assay (FDA approved for NASAL specimens only), is one component of a comprehensive MRSA colonization surveillance program. It is not intended to diagnose MRSA infection nor to guide or monitor treatment for MRSA infections.  Culture, expectorated sputum-assessment     Status: None   Collection Time: 02/02/15 12:10 PM  Result Value Ref Range Status   Specimen Description INDUCED SPUTUM  Final   Special Requests NONE  Final   Sputum evaluation THIS SPECIMEN IS ACCEPTABLE FOR SPUTUM CULTURE  Final   Report Status 02/02/2015 FINAL  Final  Culture, respiratory (NON-Expectorated)     Status: None (Preliminary result)   Collection Time: 02/02/15 12:10 PM  Result Value Ref Range Status   Specimen Description INDUCED SPUTUM  Final   Special Requests NONE Reflexed from [id number]  Final   Gram Stain   Final    EXCELLENT SPECIMEN - 90-100% WBCS MANY WBC SEEN RARE FUNGAL ELEMENT SEEN     Culture   Final    MOLD PREVIOUS MOLD SENT TO THE STATE LABORATORY FOR IDENTIFICATION LIGHT GROWTH CANDIDA SPECIES    Report Status PENDING  Incomplete  C difficile quick scan w PCR reflex     Status: None   Collection Time: 02/03/15 10:33 AM  Result Value Ref Range Status   C Diff antigen NEGATIVE NEGATIVE Final   C Diff toxin NEGATIVE NEGATIVE Final   C Diff interpretation Negative for C. difficile  Final    Medical History: Past Medical History  Diagnosis Date  . Seizures     Medications:  Scheduled:  . amiodarone  200 mg Oral Daily  . citalopram  20 mg Oral BH-q7a  . dextromethorphan  30 mg Oral BID   And  . guaiFENesin  600 mg Oral BID  . enoxaparin (LOVENOX) injection  40 mg Subcutaneous Q24H  . feeding supplement (ENSURE ENLIVE)  237 mL Oral TID  . folic acid  1 mg Oral Daily  . multivitamin with minerals  1 tablet Oral Daily  . nicotine  21 mg Transdermal Daily  . potassium chloride  20 mEq Oral TID WC  . primidone  200 mg Oral QHS  . sodium chloride  3 mL Intravenous Q12H  . thiamine  100 mg Oral Daily  . topiramate  25 mg Oral BID  . voriconazole  200 mg Oral Q12H   Assessment: Pharmacy consulted to transition patient from IV Voriconazole to PO.  Currently ordered Voriconazole 200 mg IV q12h.     Plan:  Will transition patient to Voriconazole 200 mg PO BID based on indication.   [name],Jonathan G 02/09/2015,3:10 PM

## 2015-02-09 NOTE — Care Management Note (Deleted)
Case Management Note  Patient Details  Name: Jonathan [name] MRN: [id number] Date of Birth: January 08, 1956  Subjective/Objective:    Plan is for patient to go home tomorrow. Requested prescription for home antibiotics from Dr. Margaretmary Eddy as soon as she received the cultures back. Medications management, MATCH and funds request from the foundation have been initiated by another case Freight forwarder.                Action/Plan:   Expected Discharge Date:                  Expected Discharge Plan:     In-House Referral:  Clinical Social Work  Discharge planning Services     Post Acute Care Choice:    Choice offered to:     DME Arranged:    DME Agency:     HH Arranged:    [place] Agency:     Status of Service:  In process, will continue to follow  Medicare Important Message Given:    Date Medicare IM Given:    Medicare IM give by:    Date Additional Medicare IM Given:    Additional Medicare Important Message give by:     If discussed at [place] of Stay Meetings, dates discussed:    Additional Comments:  Jolly Mango, RN 02/09/2015, 1:31 PM

## 2015-02-09 NOTE — Progress Notes (Signed)
[place] at [place] NAME: Jonathan [name]    MR#:  [id number]  DATE OF BIRTH:  [birth year]  SUBJECTIVE:   still cough with sputum and weakness REVIEW OF SYSTEMS:    Review of Systems  Constitutional: Positive for malaise/fatigue. Negative for fever, chills and weight loss.  HENT: Negative for sore throat.   Eyes: Negative for blurred vision.  Respiratory: Positive for cough. Negative for hemoptysis, shortness of breath and wheezing.   Cardiovascular: Negative for chest pain, palpitations and leg swelling.  Gastrointestinal: Negative for nausea, vomiting, abdominal pain and blood in stool.  Genitourinary: Negative for dysuria.  Musculoskeletal: Negative for back pain.  Neurological: Positive for weakness. Negative for dizziness, tremors and headaches.  Endo/Heme/Allergies: Does not bruise/bleed easily.  Psychiatric/Behavioral: Negative for depression.    Tolerating Diet: Yes      DRUG ALLERGIES:  No Known Allergies  VITALS:  Blood pressure 99/55, pulse 95, temperature 98.6 F (37 C), temperature source Oral, resp. rate 18, height 5\' 9"  (1.753 m), weight 47 kg (103 lb 9.9 oz), SpO2 95 %.  PHYSICAL EXAMINATION:   Physical Exam  Constitutional: He is oriented to person, place, and time and well-developed, well-nourished, and in no distress. No distress.  Frail weak  HENT:  Head: Normocephalic.  Eyes: No scleral icterus.  Neck: Normal range of motion. Neck supple. No JVD present. No tracheal deviation present.  Cardiovascular: Regular rhythm and normal heart sounds.  Exam reveals no gallop and no friction rub.   No murmur heard. Tachycardic  Pulmonary/Chest: Effort normal. No respiratory distress. He has no wheezes. He has no rales. He exhibits no tenderness.  rhonchii  Abdominal: Soft. Bowel sounds are normal. He exhibits no distension and no mass. There is no tenderness. There is no rebound and no guarding.   Musculoskeletal: Normal range of motion. He exhibits edema.  Neurological: He is alert and oriented to person, place, and time.  Skin: Skin is warm. No rash noted. He is not diaphoretic. No erythema.  Psychiatric: Affect and judgment normal.   The patient has mild bilateral rhonchii in the lungs. Bilateral arm edema.   LABORATORY PANEL:   CBC  Recent Labs Lab 02/09/15 0547  WBC 16.3*  HGB 7.8*  HCT 24.2*  PLT 526*   ------------------------------------------------------------------------------------------------------------------  Chemistries   Recent Labs Lab 02/09/15 0547  NA 138  K 3.8  CL 112*  CO2 20*  GLUCOSE 80  BUN 9  CREATININE 0.62  CALCIUM 7.6*  MG 1.9   ------------------------------------------------------------------------------------------------------------------  Cardiac Enzymes No results for input(s): TROPONINI in the last 168 hours. ------------------------------------------------------------------------------------------------------------------  RADIOLOGY:  Dg Chest 2 View  02/08/2015   CLINICAL DATA:  Follow-up pneumonia, shortness of breath and sepsis.  EXAM: CHEST  2 VIEW  COMPARISON:  02/04/2015 chest radiograph  FINDINGS: A right PICC terminates in the lower third of the superior vena cava. Stable cardiomediastinal silhouette with normal heart size. No pneumothorax. There are stable small bilateral pleural effusions. There is no appreciable change in the large cavitary left upper lobe mass with extensive surrounding patchy consolidation. There is stable mild hazy opacities in the left lower lobe. No new focal lung consolidation. No pulmonary edema.  IMPRESSION: 1. Stable large left upper lobe cavitary mass with surrounding consolidation. Differential includes tuberculosis, fungal pneumonia and/or underlying neoplasm. Continued post treatment chest imaging follow-up is advised. 2. Stable mild hazy opacities in the left lower lobe. 3. Stable small  bilateral pleural effusions.  Electronically Signed   By: Ilona Sorrel M.D.   On: 02/08/2015 07:19   US Venous Img Upper Bilat  02/08/2015   CLINICAL DATA:  Arm edema, x1 week.  Right arm PICC line.  EXAM: RIGHT UPPER EXTREMITY VENOUS DOPPLER ULTRASOUND  TECHNIQUE: Gray-scale sonography with graded compression, as well as color Doppler and duplex ultrasound were performed to evaluate the upper extremity deep venous system from the level of the subclavian vein and including the jugular, axillary, basilic and upper cephalic vein. Spectral Doppler was utilized to evaluate flow at rest and with distal augmentation maneuvers.  COMPARISON:  None.  FINDINGS: There is nonocclusive thrombus around the PICC line in the brachial, axillary, and subclavian veins. These veins are partially compressible. Unremarkable right jugular, cephalic, radial and ulnar veins. On the left the left IJ vein and subclavian vein unremarkable. Axillary vein shows normal compressibility. There is mural thrombus in a brachial vein which is partially compressible. There is some mural thrombus in the basilic vein with persistent flow. Cephalic, radial and ulnar veins remain patent.  IMPRESSION: 1. Right partially occlusive DVT involving brachial, axillary, and subclavian veins, adjacent to PICC catheter. 2. Nonocclusive mural thrombus in left brachial and basilic veins.   Electronically Signed   By: Lucrezia Europe M.D.   On: 02/08/2015 16:10     ASSESSMENT AND PLAN:   58 year old male who presents with acute respiratory failure secondary to left upper lobe cavitation with septic shock.  1. Acute hypoxic respiratory failure: This is secondary to left upper lobe cavitation fungi infection:  AFBs are negative. disontinued Zosyn and vancomycin by Dr. Ola Spurr. His sputum culture showed mold and therefore was started on voriconazole on September 22. Blood cultures are negative to date. He will require voriconazole lifelong as per Dr. 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   Per Dr. Genevive Bi,  follow-up at a tertiary care center for this particular issue. I discussed with Dr. Genevive Bi today. He suggested that the patient is not stable enough to get surgery at this time and needs to follow-up [facility] as outpatient after discharge.  2. Septic shock: This is secondary to left upper lobe cavitation.  Improved.  3. Atrial fibrillation RVR: This is likely secondary to problem #1 and 2. 2-D echocardiogram which showed normal ejection fraction and no major valvular abnormalities and/or endocarditis. Patient is off amiodarone drip and on by mouth amiodarone. Per Dr. Nehemiah Massed, continue current treatment.  4. Elevated troponin: It is reported that the second troponin is 2.59 and then subsequently 0.05 I believes that there is this probably an error.   5. Hypokalemia: Improved after given potassium supplement.  6. Leukocytosis: Likely secondary to pneumonia and cavitary lesion. Improving, follow-up CBC.  7. Diarrhea: C. difficile negative.   Anemia of chronic disease. Hemoglobin is stable. Malnutrition. BMI 15. Encourage oral intake and Follow-up dietitian.  *acute bilateral arm DVT. I discussed with the Dr. Ola Spurr, the patient has high risk for bleeding from infected the cavity of the lung, no anticoagulation at this time, change to po voriconazole and removed the PICC line per Dr. Ola Spurr.  Management plans discussed with the patient and he is in agreement.   CODE STATUS: Full    Per PT evaluation, the patient the need home PT. I discussed with Dr. Genevive Bi and Dr. Ola Spurr.   TOTAL TIME TAKING CARE OF THIS PATIENT: 37 minutes.     POSSIBLE D/C in 1-2 days, DEPENDING ON CLINICAL CONDITION.  Demetrios Loll M.D on 02/09/2015 at 2:06 PM  Between 7am to 6pm - Pager - [phone] After 6pm go to [email or web address] - password [email or web address] [facility]  [place] [facility]  Office  [phone]  CC: Primary  care physician; No primary care provider on file.

## 2015-02-10 LAB — POTASSIUM: POTASSIUM: 3.9 mmol/L (ref 3.5–5.1)

## 2015-02-10 MED ORDER — AMIODARONE HCL 200 MG PO TABS: 200.0000 mg | ORAL_TABLET | Freq: Every day | ORAL | Status: AC

## 2015-02-10 MED ORDER — GUAIFENESIN ER 600 MG PO TB12: 600.0000 mg | ORAL_TABLET | Freq: Two times a day (BID) | ORAL | Status: AC | PRN

## 2015-02-10 MED ORDER — VORICONAZOLE 200 MG PO TABS: 200.0000 mg | ORAL_TABLET | Freq: Two times a day (BID) | ORAL | Status: DC

## 2015-02-10 NOTE — Discharge Summary (Signed)
[place] at [place] NAME: Jonathan [name]    MR#:  [id number]  DATE OF BIRTH:  05/31/1955  DATE OF ADMISSION:  02/01/2015 ADMITTING PHYSICIAN: Bettey Costa, MD  DATE OF DISCHARGE: 02/10/2015  4:38 PM  PRIMARY CARE PHYSICIAN: No primary care provider on file.    ADMISSION DIAGNOSIS:  Cough [R05] Community acquired pneumonia [J18.9] Sepsis, due to unspecified organism [A41.9]   DISCHARGE DIAGNOSIS:  Acute hypoxic respiratory failure Septic shock left upper lobe cavitation fungi infection Atrial fibrillation RVR acute bilateral arm DVT Malnutrition  SECONDARY DIAGNOSIS:   Past Medical History  Diagnosis Date  . Seizures     HOSPITAL COURSE:   1. Acute hypoxic respiratory failure: This is secondary to left upper lobe cavitation fungi infection: AFBs are negative. The patient was treated with Zosyn and vancomycin, which was discontinued by Dr. Ola Spurr. Since sputum culture showed mold and therefore he was started on voriconazole on September 22. Blood cultures are negative to date. He will require voriconazole lifelong as per Dr. 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  Per Dr. Genevive Bi, follow-up at a tertiary care center for this particular issue. I discussed with Dr. Genevive Bi today. He suggested that the patient is not stable enough to get surgery at this time and needs to follow-up [facility] as outpatient after discharge.  2. Septic shock: This is secondary to left upper lobe cavitation. Improved after antibiotics and IV fluid treatment.  3. Atrial fibrillation RVR: This is likely secondary to problem #1 and 2. 2-D echocardiogram which showed normal ejection fraction and no major valvular abnormalities and/or endocarditis. Patient was on amiodarone drip and changed to by mouth amiodarone, but no anticoagulation due to high risk for lung hemorrhage from fungal infection per Dr. Nehemiah Massed.  4.  Elevated troponin: It is reported that the second troponin is 2.59 and then subsequently 0.05 I believes that there is this probably an error.   5. Hypokalemia: Improved after given potassium supplement.  6. Leukocytosis: Likely secondary to pneumonia and cavitary lesion. Improving, follow-up CBC.  7. Diarrhea: C. difficile negative.   Anemia of chronic disease. Hemoglobin is stable. Malnutrition. BMI 15. Encourage oral intake.  *acute bilateral arm DVT. I discussed with the Dr. Ola Spurr, the patient has high risk for bleeding from infected the cavity of the lung, no anticoagulation at this time, change to po voriconazole and removed the PICC line per Dr. Ola Spurr.  The patient's symptoms has much improved. He only has mild cough and the sputum but no shortness of breath or hemoptysis. PT evaluation suggested home PT. The patient cannot get home health due to no insurance per case manager. Case manager will help patient to get po voriconazole. He needed to follow-up [facility] thoracic surgeon as outpatient for possible surgery. DISCHARGE CONDITIONS:   Medically stable, discharged to home today.   CONSULTS OBTAINED:  Treatment Team:  Teodoro Spray, MD Adrian Prows, MD Clayburn Pert, MD Demetrios Loll, MD Corey Skains, MD Leia Alf, MD  DRUG ALLERGIES:  No Known Allergies  DISCHARGE MEDICATIONS:   Discharge Medication List as of 02/10/2015  1:53 PM    START taking these medications   Details  amiodarone (PACERONE) 200 MG tablet Take 1 tablet (200 mg total) by mouth daily., Starting 02/10/2015, Until Discontinued, Print    guaiFENesin (MUCINEX) 600 MG 12 hr tablet Take 1 tablet (600 mg total) by mouth 2 (two) times daily as needed., Starting 02/10/2015,  Until Discontinued, Print    voriconazole (VFEND) 200 MG tablet Take 1 tablet (200 mg total) by mouth every 12 (twelve) hours., Starting 02/10/2015, Until Discontinued, Print      CONTINUE these medications which have  NOT CHANGED   Details  citalopram (CELEXA) 20 MG tablet Take 20 mg by mouth every morning., Until Discontinued, Historical Med    primidone (MYSOLINE) 50 MG tablet Take 200 mg by mouth at bedtime., Until Discontinued, Historical Med    topiramate (TOPAMAX) 25 MG tablet Take 25 mg by mouth 2 (two) times daily. 1 tablet in the morning and the second tablet 6 hours after the first, Until Discontinued, Historical Med         DISCHARGE INSTRUCTIONS:    If you experience worsening of your admission symptoms, develop shortness of breath, life threatening emergency, suicidal or homicidal thoughts you must seek medical attention immediately by calling 911 or calling your MD immediately  if symptoms less severe.  You Must read complete instructions/literature along with all the possible adverse reactions/side effects for all the Medicines you take and that have been prescribed to you. Take any new Medicines after you have completely understood and accept all the possible adverse reactions/side effects.   Please note  You were cared for by a hospitalist during your hospital stay. If you have any questions about your discharge medications or the care you received while you were in the hospital after you are discharged, you can call the unit and asked to speak with the hospitalist on call if the hospitalist that took care of you is not available. Once you are discharged, your primary care physician will handle any further medical issues. Please note that NO REFILLS for any discharge medications will be authorized once you are discharged, as it is imperative that you return to your primary care physician (or establish a relationship with a primary care physician if you do not have one) for your aftercare needs so that they can reassess your need for medications and monitor your lab values.    Today   SUBJECTIVE   Cough and weakness.   VITAL SIGNS:  Blood pressure 98/51, pulse 80, temperature 98.6  F (37 C), temperature source Oral, resp. rate 16, height 5\' 9"  (1.753 m), weight 59.104 kg (130 lb 4.8 oz), SpO2 94 %.  I/O:   Intake/Output Summary (Last 24 hours) at 02/10/15 1659 Last data filed at 02/10/15 1300  Gross per 24 hour  Intake    720 ml  Output    300 ml  Net    420 ml    PHYSICAL EXAMINATION:  GENERAL:  59 y.o.-year-old patient lying in the bed with no acute distress. Thin. EYES: Pupils equal, round, reactive to light and accommodation. No scleral icterus. Extraocular muscles intact.  HEENT: Head atraumatic, normocephalic. Oropharynx and nasopharynx clear.  NECK:  Supple, no jugular venous distention. No thyroid enlargement, no tenderness.  LUNGS: Normal breath sounds bilaterally, no wheezing, rales,rhonchi or crepitation. No use of accessory muscles of respiration.  CARDIOVASCULAR: S1, S2 normal. No murmurs, rubs, or gallops.  ABDOMEN: Soft, non-tender, non-distended. Bowel sounds present. No organomegaly or mass.  EXTREMITIES: No pedal edema, cyanosis, or clubbing.  NEUROLOGIC: Cranial nerves II through XII are intact. Muscle strength 4/5 in all extremities. Sensation intact. Gait not checked.  PSYCHIATRIC: The patient is alert and oriented x 3.  SKIN: No obvious rash, lesion, or ulcer.   DATA REVIEW:   CBC  Recent Labs Lab 02/09/15 [phone]  WBC 16.3*  HGB 7.8*  HCT 24.2*  PLT 526*    Chemistries   Recent Labs Lab 02/09/15 0547 02/10/15 0550  NA 138  --   K 3.8 3.9  CL 112*  --   CO2 20*  --   GLUCOSE 80  --   BUN 9  --   CREATININE 0.62  --   CALCIUM 7.6*  --   MG 1.9  --     Cardiac Enzymes No results for input(s): TROPONINI in the last 168 hours.  Microbiology Results  Results for orders placed or performed during the hospital encounter of 02/01/15  Blood culture (routine x 2)     Status: None   Collection Time: 02/01/15  7:41 AM  Result Value Ref Range Status   Specimen Description BLOOD LEFT HAND  Final   Special Requests   Final     BOTTLES DRAWN AEROBIC AND ANAEROBIC 2 CC AERO 1 CC ANAERO   Culture NO GROWTH 5 DAYS  Final   Report Status 02/06/2015 FINAL  Final  Culture, expectorated sputum-assessment     Status: None   Collection Time: 02/01/15  7:42 AM  Result Value Ref Range Status   Specimen Description EXPECTORATED SPUTUM  Final   Special Requests Immunocompromised  Final   Sputum evaluation THIS SPECIMEN IS ACCEPTABLE FOR SPUTUM CULTURE  Final   Report Status 02/01/2015 FINAL  Final  Culture, respiratory (NON-Expectorated)     Status: None (Preliminary result)   Collection Time: 02/01/15  7:42 AM  Result Value Ref Range Status   Specimen Description EXPECTORATED SPUTUM  Final   Special Requests Immunocompromised Reflexed from [id number]  Final   Gram Stain   Final    EXCELLENT SPECIMEN - 90-100% WBCS MANY WBC SEEN FEW GRAM NEGATIVE RODS MANY GRAM POSITIVE COCCI IN CHAINS    Culture MOLD SENT TO STATE FOR IDENTIFICATION  Final   Report Status PENDING  Incomplete  Blood culture (routine x 2)     Status: None   Collection Time: 02/01/15  7:43 AM  Result Value Ref Range Status   Specimen Description BLOOD RIGHT ARM  Final   Special Requests   Final    BOTTLES DRAWN AEROBIC AND ANAEROBIC 1 CC ANAERO 4 CC AERO   Culture NO GROWTH 5 DAYS  Final   Report Status 02/06/2015 FINAL  Final  Urine culture     Status: None   Collection Time: 02/01/15  8:35 AM  Result Value Ref Range Status   Specimen Description URINE, CLEAN CATCH  Final   Special Requests NONE  Final   Culture NO GROWTH 1 DAY  Final   Report Status 02/02/2015 FINAL  Final  MRSA PCR Screening     Status: None   Collection Time: 02/01/15 12:50 PM  Result Value Ref Range Status   MRSA by PCR NEGATIVE NEGATIVE Final    Comment:        The GeneXpert MRSA Assay (FDA approved for NASAL specimens only), is one component of a comprehensive MRSA colonization surveillance program. It is not intended to diagnose MRSA infection nor to guide  or monitor treatment for MRSA infections.   Culture, expectorated sputum-assessment     Status: None   Collection Time: 02/02/15 12:10 PM  Result Value Ref Range Status   Specimen Description INDUCED SPUTUM  Final   Special Requests NONE  Final   Sputum evaluation THIS SPECIMEN IS ACCEPTABLE FOR SPUTUM CULTURE  Final   Report Status 02/02/2015 FINAL  Final  Culture, respiratory (NON-Expectorated)     Status: None (Preliminary result)   Collection Time: 02/02/15 12:10 PM  Result Value Ref Range Status   Specimen Description INDUCED SPUTUM  Final   Special Requests NONE Reflexed from [id number]  Final   Gram Stain   Final    EXCELLENT SPECIMEN - 90-100% WBCS MANY WBC SEEN RARE FUNGAL ELEMENT SEEN    Culture   Final    MOLD PREVIOUS MOLD SENT TO THE STATE LABORATORY FOR IDENTIFICATION LIGHT GROWTH CANDIDA SPECIES    Report Status PENDING  Incomplete  C difficile quick scan w PCR reflex     Status: None   Collection Time: 02/03/15 10:33 AM  Result Value Ref Range Status   C Diff antigen NEGATIVE NEGATIVE Final   C Diff toxin NEGATIVE NEGATIVE Final   C Diff interpretation Negative for C. difficile  Final    RADIOLOGY:  No results found.      Management plans discussed with the patient, family and they are in agreement.  CODE STATUS:     Code Status Orders        Start     Ordered   02/01/15 1135  Full code   Continuous     02/01/15 1134      TOTAL TIME TAKING CARE OF THIS PATIENT: 42 minutes.    Demetrios Loll M.D on 02/10/2015 at 4:59 PM  Between 7am to 6pm - Pager - [phone]  After 6pm go to [email or web address] - password [email or web address] [facility]  [place] [facility]  Office  [phone]  CC: Primary care physician; No primary care provider on file.

## 2015-02-10 NOTE — Care Management Note (Signed)
Case Management Note  Patient Details  Name: Jonathan [name] MRN: [id number] Date of Birth: [birth year]  Subjective/Objective:   Appointment scheduled with [facility] for Oct. 5, 2016 at 1:00 PM with Dr. Gwynneth Aliment. Placed on discharge instructions for patient.                  Action/Plan:   Expected Discharge Date:                  Expected Discharge Plan:     In-House Referral:  Clinical Social Work  Discharge planning Services     Post Acute Care Choice:    Choice offered to:     DME Arranged:    DME Agency:     HH Arranged:    [place] Agency:     Status of Service:  In process, will continue to follow  Medicare Important Message Given:    Date Medicare IM Given:    Medicare IM give by:    Date Additional Medicare IM Given:    Additional Medicare Important Message give by:     If discussed at [place] of Stay Meetings, dates discussed:    Additional Comments:  Jolly Mango, RN 02/10/2015, 11:45 AM

## 2015-02-10 NOTE — Care Management Note (Signed)
Case Management Note  Patient Details  Name: Jonathan [name] MRN: [id number] Date of Birth: [birth year]  Subjective/Objective:   Spoke with Holley Dexter at [facility] [place]. She states that they do not have the drug Voriconazole on their formulary.  They can however bring patient in as a new patient, enroll him in the [place] dmedications  assistance program and get the medication through [employer]. Appointment scheduled for Oct. 10 @ 1PM with medication management.Place on patient discharge instructions.           Action/Plan:   Expected Discharge Date:                  Expected Discharge Plan:     In-House Referral:  Clinical Social Work  Discharge planning Services     Post Acute Care Choice:    Choice offered to:     DME Arranged:    DME Agency:     HH Arranged:    [place] Agency:     Status of Service:  In process, will continue to follow  Medicare Important Message Given:    Date Medicare IM Given:    Medicare IM give by:    Date Additional Medicare IM Given:    Additional Medicare Important Message give by:     If discussed at [place] of Stay Meetings, dates discussed:    Additional Comments:  Jolly Mango, RN 02/10/2015, 2:27 PM

## 2015-02-10 NOTE — Progress Notes (Signed)
Pharmacy Consult for Electrolyte Management   No Known Allergies  Patient Measurements: Height: 5\' 9"  (175.3 cm) Weight: 130 lb 4.8 oz (59.104 kg) IBW/kg (Calculated) : 70.7  Vital Signs: Temp: 98.6 F (37 C) (09/29 0301) Temp Source: Oral (09/29 0301) BP: 103/52 mmHg (09/29 0301) Pulse Rate: 98 (09/29 0301) Intake/Output from previous day: 09/28 0701 - 09/29 0700 In: 240 [P.O.:240] Out: 300 [Urine:300] Intake/Output from this shift:    Labs:  Recent Labs  02/08/15 0759 02/09/15 0547  WBC 16.0* 16.3*  HGB 8.4* 7.8*  HCT 25.2* 24.2*  PLT 514* 526*     Recent Labs  02/08/15 0759 02/09/15 0547 02/10/15 0550  NA 139 138  --   K 3.5 3.8 3.9  CL 111 112*  --   CO2 22 20*  --   GLUCOSE 92 80  --   BUN 8 9  --   CREATININE 0.69 0.62  --   CALCIUM 7.5* 7.6*  --   MG  --  1.9  --    Estimated Creatinine Clearance: 85.2 mL/min (by C-G formula based on Cr of 0.62).   No results for input(s): GLUCAP in the last 72 hours.  Medical History: Past Medical History  Diagnosis Date  . Seizures     Medications:  Scheduled:  . amiodarone  200 mg Oral Daily  . citalopram  20 mg Oral [id number]  . dextromethorphan  30 mg Oral BID   And  . guaiFENesin  600 mg Oral BID  . enoxaparin (LOVENOX) injection  40 mg Subcutaneous Q24H  . feeding supplement (ENSURE ENLIVE)  237 mL Oral TID  . folic acid  1 mg Oral Daily  . multivitamin with minerals  1 tablet Oral Daily  . nicotine  21 mg Transdermal Daily  . potassium chloride  20 mEq Oral TID WC  . primidone  200 mg Oral QHS  . sodium chloride  3 mL Intravenous Q12H  . thiamine  100 mg Oral Daily  . topiramate  25 mg Oral BID  . voriconazole  200 mg Oral Q12H   Infusions:    PRN: acetaminophen **OR** acetaminophen, HYDROcodone-acetaminophen, LORazepam, senna-docusate  Assessment: Pharmacy consulted to assist in managing electrolytes in this 59 y/o M with acute respiratory failure due to LUL cavitary PNA.    Electrolytes WNL   Plan:  Electrolytes acceptable this AM. No supplementation warranted.  Patient currently ordered KCl 20 meq TID (not a home med).  Will recheck potassium in AM. Will continue to follow.  0929 AM K 3.9, will continue current regimen and recheck in [place], Pharm.D.  Clinical Pharmacist 02/10/2015,6:53 AM

## 2015-02-10 NOTE — Care Management (Signed)
Per [place] Assistant Director patient to be provided with 3 Vfend tabs at time of discharge.  Ordered from pharmacy to to be delivered to patient at bedside.  Patient provided with [facility] Medication Assistance Card.  RNCM spoke with [facility] on [address].  [facility] is ordering medication and will be available at 3 pm on 02/11/15.  Patient's father at bedside.  Updated patient's father with plan/appointment for medication management 02/21/15.  RNCM encouraged patient to follow up with social services on applying for Medicaid, as well as apply to [place] care once established with [facility] as outpatient.

## 2015-02-10 NOTE — Discharge Instructions (Signed)
Regular diet. Activity as tolerated. Nutrition. Follow up [facility] thoracic surgeon in 1-2  Weeks for possible surgery.

## 2015-02-10 NOTE — Care Management Note (Signed)
Case Management Note  Patient Details  Name: DERRECK WILTSEY MRN: [id number] Date of Birth: 1955/10/11  Subjective/Objective:   Voriconazole cash price is 522.96 per 30 day supply. Patient has no income and can not afford medication. Will have to speak further with CM director regarding resources.                  Action/Plan:   Expected Discharge Date:                  Expected Discharge Plan:     In-House Referral:  Clinical Social Work  Discharge planning Services     Post Acute Care Choice:    Choice offered to:     DME Arranged:    DME Agency:     HH Arranged:    [place] Agency:     Status of Service:  In process, will continue to follow  Medicare Important Message Given:    Date Medicare IM Given:    Medicare IM give by:    Date Additional Medicare IM Given:    Additional Medicare Important Message give by:     If discussed at [place] of Stay Meetings, dates discussed:    Additional Comments:  Jolly Mango, RN 02/10/2015, 8:55 AM

## 2015-02-10 NOTE — Progress Notes (Signed)
Pt discharged via wheelchair care of his dad, pt given meds supplied by pharmacy VFEND 3 tablets

## 2015-02-11 ENCOUNTER — Telehealth: Payer: Self-pay | Admitting: *Deleted

## 2015-02-11 NOTE — Telephone Encounter (Signed)
appt scheduled for 02/24/15 @ 10am per VM. Pt informed. Nothing further needed.

## 2015-02-11 NOTE — Telephone Encounter (Signed)
-----   Message from Vilinda Boehringer, MD sent at 02/08/2015  3:48 PM EDT ----- Regarding: RE: hospital follow up  Carlus Pavlov or [place], then they can continue to follow up with Ashby Dawes ----- Message -----    From: Renelda Mom, LPN    Sent: 5/46/2703   3:15 PM      To: Vilinda Boehringer, MD Subject: RE: hospital follow up                         Ashby Dawes doesn't have an opening for hospital f/u until 03/21/15. Do you want me to put him in with someone else? Thanks.   Misty ----- Message -----    From: Vilinda Boehringer, MD    Sent: 02/08/2015  11:05 AM      To: Renelda Mom, LPN Subject: hospital follow up                             Please setup hospital follow up with Dr. Ashby Dawes in 2-3 wks.   Thank you -VM

## 2015-02-13 ENCOUNTER — Inpatient Hospital Stay
Admission: EM | Admit: 2015-02-13 | Discharge: 2015-02-18 | DRG: 871 | Disposition: A | Discharge status: Skilled Nursing Facility | Payer: Medicaid Other | Attending: Internal Medicine | Admitting: Internal Medicine

## 2015-02-13 ENCOUNTER — Emergency Department: Payer: Medicaid Other

## 2015-02-13 DIAGNOSIS — Z87820 Personal history of traumatic brain injury: Secondary | ICD-10-CM | POA: Diagnosis not present

## 2015-02-13 DIAGNOSIS — G40909 Epilepsy, unspecified, not intractable, without status epilepticus: Secondary | ICD-10-CM | POA: Diagnosis present

## 2015-02-13 DIAGNOSIS — Z79899 Other long term (current) drug therapy: Secondary | ICD-10-CM | POA: Diagnosis not present

## 2015-02-13 DIAGNOSIS — A419 Sepsis, unspecified organism: Secondary | ICD-10-CM | POA: Diagnosis not present

## 2015-02-13 DIAGNOSIS — E876 Hypokalemia: Secondary | ICD-10-CM | POA: Diagnosis present

## 2015-02-13 DIAGNOSIS — J850 Gangrene and necrosis of lung: Secondary | ICD-10-CM | POA: Diagnosis present

## 2015-02-13 DIAGNOSIS — Z681 Body mass index (BMI) 19 or less, adult: Secondary | ICD-10-CM

## 2015-02-13 DIAGNOSIS — I4891 Unspecified atrial fibrillation: Secondary | ICD-10-CM | POA: Diagnosis present

## 2015-02-13 DIAGNOSIS — B449 Aspergillosis, unspecified: Secondary | ICD-10-CM | POA: Diagnosis present

## 2015-02-13 DIAGNOSIS — R197 Diarrhea, unspecified: Secondary | ICD-10-CM | POA: Diagnosis present

## 2015-02-13 DIAGNOSIS — E860 Dehydration: Secondary | ICD-10-CM | POA: Diagnosis present

## 2015-02-13 DIAGNOSIS — A4189 Other specified sepsis: Secondary | ICD-10-CM | POA: Diagnosis present

## 2015-02-13 DIAGNOSIS — G250 Essential tremor: Secondary | ICD-10-CM | POA: Diagnosis present

## 2015-02-13 DIAGNOSIS — E43 Unspecified severe protein-calorie malnutrition: Secondary | ICD-10-CM | POA: Diagnosis present

## 2015-02-13 DIAGNOSIS — R748 Abnormal levels of other serum enzymes: Secondary | ICD-10-CM | POA: Diagnosis present

## 2015-02-13 DIAGNOSIS — R6521 Severe sepsis with septic shock: Secondary | ICD-10-CM | POA: Diagnosis present

## 2015-02-13 DIAGNOSIS — F329 Major depressive disorder, single episode, unspecified: Secondary | ICD-10-CM | POA: Diagnosis present

## 2015-02-13 DIAGNOSIS — R059 Cough, unspecified: Secondary | ICD-10-CM

## 2015-02-13 DIAGNOSIS — F0390 Unspecified dementia without behavioral disturbance: Secondary | ICD-10-CM | POA: Diagnosis present

## 2015-02-13 DIAGNOSIS — D638 Anemia in other chronic diseases classified elsewhere: Secondary | ICD-10-CM | POA: Diagnosis present

## 2015-02-13 DIAGNOSIS — I82623 Acute embolism and thrombosis of deep veins of upper extremity, bilateral: Secondary | ICD-10-CM | POA: Diagnosis present

## 2015-02-13 DIAGNOSIS — J690 Pneumonitis due to inhalation of food and vomit: Secondary | ICD-10-CM

## 2015-02-13 DIAGNOSIS — J449 Chronic obstructive pulmonary disease, unspecified: Secondary | ICD-10-CM | POA: Diagnosis present

## 2015-02-13 DIAGNOSIS — F1010 Alcohol abuse, uncomplicated: Secondary | ICD-10-CM | POA: Diagnosis present

## 2015-02-13 DIAGNOSIS — F17200 Nicotine dependence, unspecified, uncomplicated: Secondary | ICD-10-CM | POA: Diagnosis present

## 2015-02-13 DIAGNOSIS — R05 Cough: Secondary | ICD-10-CM

## 2015-02-13 LAB — LACTIC ACID, PLASMA
LACTIC ACID, VENOUS: 1.8 mmol/L (ref 0.5–2.0)
LACTIC ACID, VENOUS: 1 mmol/L (ref 0.5–2.0)

## 2015-02-13 LAB — CBC WITH DIFFERENTIAL/PLATELET
BASOS ABS: 0.1 10*3/uL (ref 0–0.1)
BASOS PCT: 1 %
EOS ABS: 0.1 10*3/uL (ref 0–0.7)
Eosinophils Relative: 1 %
HCT: 23.3 % — ABNORMAL LOW (ref 40.0–52.0)
Hemoglobin: 7.6 g/dL — ABNORMAL LOW (ref 13.0–18.0)
Lymphocytes Relative: 8 %
Lymphs Abs: 1.1 10*3/uL (ref 1.0–3.6)
MCH: 29.9 pg (ref 26.0–34.0)
MCHC: 32.6 g/dL (ref 32.0–36.0)
MCV: 91.7 fL (ref 80.0–100.0)
MONO ABS: 0.6 10*3/uL (ref 0.2–1.0)
MONOS PCT: 5 %
NEUTROS PCT: 85 %
Neutro Abs: 11 10*3/uL — ABNORMAL HIGH (ref 1.4–6.5)
PLATELETS: 756 10*3/uL — AB (ref 150–440)
RBC: 2.54 MIL/uL — ABNORMAL LOW (ref 4.40–5.90)
RDW: 15.8 % — AB (ref 11.5–14.5)
WBC: 12.9 10*3/uL — ABNORMAL HIGH (ref 3.8–10.6)

## 2015-02-13 LAB — COMPREHENSIVE METABOLIC PANEL
ALK PHOS: 70 U/L (ref 38–126)
ALT: 25 U/L (ref 17–63)
AST: 26 U/L (ref 15–41)
Albumin: 2 g/dL — ABNORMAL LOW (ref 3.5–5.0)
Anion gap: 4 — ABNORMAL LOW (ref 5–15)
BILIRUBIN TOTAL: 0.4 mg/dL (ref 0.3–1.2)
BUN: 12 mg/dL (ref 6–20)
CALCIUM: 8.2 mg/dL — AB (ref 8.9–10.3)
CO2: 26 mmol/L (ref 22–32)
CREATININE: 0.82 mg/dL (ref 0.61–1.24)
Chloride: 110 mmol/L (ref 101–111)
GFR calc non Af Amer: >60 mL/min (ref >60)
GLUCOSE: 114 mg/dL — AB (ref 65–99)
Potassium: 3.5 mmol/L (ref 3.5–5.1)
SODIUM: 140 mmol/L (ref 135–145)
TOTAL PROTEIN: 6.5 g/dL (ref 6.5–8.1)

## 2015-02-13 LAB — URINALYSIS COMPLETE WITH MICROSCOPIC (ARMC ONLY)
Bilirubin Urine: NEGATIVE
GLUCOSE, UA: NEGATIVE mg/dL
HGB URINE DIPSTICK: NEGATIVE
Ketones, ur: NEGATIVE mg/dL
Leukocytes, UA: NEGATIVE
Nitrite: NEGATIVE
Protein, ur: NEGATIVE mg/dL
SQUAMOUS EPITHELIAL / LPF: NONE SEEN
Specific Gravity, Urine: 1.016 (ref 1.005–1.030)
pH: 6 (ref 5.0–8.0)

## 2015-02-13 LAB — TROPONIN I: Troponin I: <0.03 ng/mL (ref <0.031)

## 2015-02-13 LAB — ETHANOL

## 2015-02-13 MED ORDER — PIPERACILLIN-TAZOBACTAM 3.375 G IVPB
3.3750 g | Freq: Three times a day (TID) | INTRAVENOUS | Status: DC
  Administered 2015-02-13 – 2015-02-18 (×13): 3.375 g via INTRAVENOUS
  Filled 2015-02-13 (×17): qty 50

## 2015-02-13 MED ORDER — TOPIRAMATE 25 MG PO TABS
25.0000 mg | ORAL_TABLET | Freq: Two times a day (BID) | ORAL | Status: DC
Start: 2015-02-13 — End: 2015-02-18
  Administered 2015-02-13 – 2015-02-18 (×9): 25 mg via ORAL
  Filled 2015-02-13 (×10): qty 1

## 2015-02-13 MED ORDER — VORICONAZOLE 200 MG IV SOLR
6.0000 mg/kg | Freq: Once | INTRAVENOUS | Status: AC
  Administered 2015-02-13: 350 mg via INTRAVENOUS
  Filled 2015-02-13: qty 350

## 2015-02-13 MED ORDER — VANCOMYCIN HCL IN DEXTROSE 1-5 GM/200ML-% IV SOLN
1000.0000 mg | Freq: Two times a day (BID) | INTRAVENOUS | Status: DC
  Administered 2015-02-13 – 2015-02-18 (×9): 1000 mg via INTRAVENOUS
  Filled 2015-02-13 (×11): qty 200

## 2015-02-13 MED ORDER — PRIMIDONE 50 MG PO TABS
200.0000 mg | ORAL_TABLET | Freq: Every day | ORAL | Status: DC
  Administered 2015-02-13 – 2015-02-17 (×5): 200 mg via ORAL
  Filled 2015-02-13 (×6): qty 4

## 2015-02-13 MED ORDER — SODIUM CHLORIDE 0.9 % IV BOLUS (SEPSIS)
1000.0000 mL | Freq: Once | INTRAVENOUS | Status: AC
Start: 2015-02-13 — End: 2015-02-13
  Administered 2015-02-13: 1000 mL via INTRAVENOUS

## 2015-02-13 MED ORDER — AMIODARONE HCL 200 MG PO TABS
200.0000 mg | ORAL_TABLET | Freq: Every day | ORAL | Status: DC
  Administered 2015-02-13 – 2015-02-18 (×6): 200 mg via ORAL
  Filled 2015-02-13 (×6): qty 1

## 2015-02-13 MED ORDER — VORICONAZOLE 200 MG IV SOLR
4.0000 mg/kg | Freq: Two times a day (BID) | INTRAVENOUS | Status: DC
  Administered 2015-02-14 – 2015-02-18 (×10): 240 mg via INTRAVENOUS
  Filled 2015-02-13 (×11): qty 240

## 2015-02-13 MED ORDER — GUAIFENESIN ER 600 MG PO TB12: 600.0000 mg | ORAL_TABLET | Freq: Two times a day (BID) | ORAL | Status: DC | PRN

## 2015-02-13 MED ORDER — PIPERACILLIN-TAZOBACTAM 3.375 G IVPB 30 MIN
3.3750 g | Freq: Once | INTRAVENOUS | Status: AC
  Administered 2015-02-13: 3.375 g via INTRAVENOUS
  Filled 2015-02-13: qty 50

## 2015-02-13 MED ORDER — VANCOMYCIN HCL IN DEXTROSE 1-5 GM/200ML-% IV SOLN
1000.0000 mg | Freq: Once | INTRAVENOUS | Status: AC
  Administered 2015-02-13: 1000 mg via INTRAVENOUS
  Filled 2015-02-13: qty 200

## 2015-02-13 MED ORDER — SODIUM CHLORIDE 0.9 % IV BOLUS (SEPSIS)
1000.0000 mL | Freq: Once | INTRAVENOUS | Status: AC
  Administered 2015-02-13: 1000 mL via INTRAVENOUS

## 2015-02-13 MED ORDER — CITALOPRAM HYDROBROMIDE 20 MG PO TABS
20.0000 mg | ORAL_TABLET | ORAL | Status: DC
Start: 2015-02-14 — End: 2015-02-18
  Administered 2015-02-14 – 2015-02-18 (×5): 20 mg via ORAL
  Filled 2015-02-13 (×6): qty 1

## 2015-02-13 MED ORDER — ACETAMINOPHEN 325 MG PO TABS
650.0000 mg | ORAL_TABLET | Freq: Four times a day (QID) | ORAL | Status: DC | PRN
Start: 2015-02-13 — End: 2015-02-18
  Administered 2015-02-14 – 2015-02-18 (×6): 650 mg via ORAL
  Filled 2015-02-13 (×6): qty 2

## 2015-02-13 MED ORDER — SODIUM CHLORIDE 0.9 % IV SOLN
INTRAVENOUS | Status: DC
  Administered 2015-02-14 – 2015-02-18 (×8): via INTRAVENOUS

## 2015-02-13 MED ORDER — ACETAMINOPHEN 650 MG RE SUPP: 650.0000 mg | Freq: Four times a day (QID) | RECTAL | Status: DC | PRN

## 2015-02-13 NOTE — Progress Notes (Signed)
ANTIBIOTIC CONSULT NOTE - INITIAL  Pharmacy Consult for Vancomycin/Zosyn Indication: rule out sepsis  No Known Allergies  Patient Measurements:  Wt: 59.1 kg  Vital Signs: Temp: 97.6 F (36.4 C) (10/02 0910) Temp Source: Oral (10/02 0910) BP: 91/48 mmHg (10/02 1100) Pulse Rate: 86 (10/02 1100) Intake/Output from previous day:   Intake/Output from this shift: Total I/O In: 1100 [I.V.:1100] Out: -   Labs:  Recent Labs  02/13/15 0945  WBC 12.9*  HGB 7.6*  PLT 756*  CREATININE 0.82   Estimated Creatinine Clearance: 83.1 mL/min (by C-G formula based on Cr of 0.82). No results for input(s): VANCOTROUGH, VANCOPEAK, VANCORANDOM, GENTTROUGH, GENTPEAK, GENTRANDOM, TOBRATROUGH, TOBRAPEAK, TOBRARND, AMIKACINPEAK, AMIKACINTROU, AMIKACIN in the last 72 hours.   Microbiology: Recent Results (from the past 720 hour(s))  Blood culture (routine x 2)     Status: None   Collection Time: 02/01/15  7:41 AM  Result Value Ref Range Status   Specimen Description BLOOD LEFT HAND  Final   Special Requests   Final    BOTTLES DRAWN AEROBIC AND ANAEROBIC 2 CC AERO 1 CC ANAERO   Culture NO GROWTH 5 DAYS  Final   Report Status 02/06/2015 FINAL  Final  Culture, expectorated sputum-assessment     Status: None   Collection Time: 02/01/15  7:42 AM  Result Value Ref Range Status   Specimen Description EXPECTORATED SPUTUM  Final   Special Requests Immunocompromised  Final   Sputum evaluation THIS SPECIMEN IS ACCEPTABLE FOR SPUTUM CULTURE  Final   Report Status 02/01/2015 FINAL  Final  Culture, respiratory (NON-Expectorated)     Status: None (Preliminary result)   Collection Time: 02/01/15  7:42 AM  Result Value Ref Range Status   Specimen Description EXPECTORATED SPUTUM  Final   Special Requests Immunocompromised Reflexed from [id number]  Final   Gram Stain   Final    EXCELLENT SPECIMEN - 90-100% WBCS MANY WBC SEEN FEW GRAM NEGATIVE RODS MANY GRAM POSITIVE COCCI IN CHAINS    Culture MOLD SENT TO  STATE FOR IDENTIFICATION  Final   Report Status PENDING  Incomplete  Blood culture (routine x 2)     Status: None   Collection Time: 02/01/15  7:43 AM  Result Value Ref Range Status   Specimen Description BLOOD RIGHT ARM  Final   Special Requests   Final    BOTTLES DRAWN AEROBIC AND ANAEROBIC 1 CC ANAERO 4 CC AERO   Culture NO GROWTH 5 DAYS  Final   Report Status 02/06/2015 FINAL  Final  Urine culture     Status: None   Collection Time: 02/01/15  8:35 AM  Result Value Ref Range Status   Specimen Description URINE, CLEAN CATCH  Final   Special Requests NONE  Final   Culture NO GROWTH 1 DAY  Final   Report Status 02/02/2015 FINAL  Final  MRSA PCR Screening     Status: None   Collection Time: 02/01/15 12:50 PM  Result Value Ref Range Status   MRSA by PCR NEGATIVE NEGATIVE Final    Comment:        The GeneXpert MRSA Assay (FDA approved for NASAL specimens only), is one component of a comprehensive MRSA colonization surveillance program. It is not intended to diagnose MRSA infection nor to guide or monitor treatment for MRSA infections.   Culture, expectorated sputum-assessment     Status: None   Collection Time: 02/02/15 12:10 PM  Result Value Ref Range Status   Specimen Description INDUCED SPUTUM  Final  Special Requests NONE  Final   Sputum evaluation THIS SPECIMEN IS ACCEPTABLE FOR SPUTUM CULTURE  Final   Report Status 02/02/2015 FINAL  Final  Culture, respiratory (NON-Expectorated)     Status: None (Preliminary result)   Collection Time: 02/02/15 12:10 PM  Result Value Ref Range Status   Specimen Description INDUCED SPUTUM  Final   Special Requests NONE Reflexed from [id number]  Final   Gram Stain   Final    EXCELLENT SPECIMEN - 90-100% WBCS MANY WBC SEEN RARE FUNGAL ELEMENT SEEN    Culture   Final    MOLD PREVIOUS MOLD SENT TO THE STATE LABORATORY FOR IDENTIFICATION LIGHT GROWTH CANDIDA SPECIES    Report Status PENDING  Incomplete  C difficile quick scan w PCR  reflex     Status: None   Collection Time: 02/03/15 10:33 AM  Result Value Ref Range Status   C Diff antigen NEGATIVE NEGATIVE Final   C Diff toxin NEGATIVE NEGATIVE Final   C Diff interpretation Negative for C. difficile  Final    Medical History: Past Medical History  Diagnosis Date  . Seizures [facility])     Assessment: 59 yo male brought to ED via EMS for weakness and confusion. 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 Patient meets sepsis criteria (BP: 84/43, HR: 94, WBC: 12.9). Pharmacy consulted to dose Vancomycin and Zosyn. Patient also receiving Voriconazole IV.  Ke: 0.073 T1/2: 9.5 Vd: 41.3   Goal of Therapy:  Vancomycin trough level 15-20 mcg/ml  Plan:  Will start patient on Zosyn 3.375 q8 hours.  Patient was given 1g in ED at 1000 will do stack dosing 6 hours after ED infusion. Will start patient on Vancomycin regimen of 1g q12 hours. Trough ordered prior to 4th dose on 9/3 @ 1630.  Pharmacy will continue to monitor renal function and levels and make adjustments as needed.   Nancy Fetter, PharmD Pharmacy Resident

## 2015-02-13 NOTE — H&P (Signed)
Jonathan [name] is an 59 y.o. male.   Chief Complaint: 79 yr old father cannot take care of me HPI: Just discharged 2 days ago after 9 day hospital stay for resp failure. Found to have cavatative lung lesion that had positive fungal cultures. Pt was placed on Voriconazole but failed to get it filled as an out patient. Has not been eating or drinking much secondary to not being able to take care of himself or is elderly father. His father brought him to ED where he was found to have BP in the 70's and slightly worsening CXR.  Past Medical History  Diagnosis Date  . Seizures ([place])     No past surgical history on file.  No known surgeries  No family history on file.  No CA  Social History:  reports that he has been smoking.  He does not have any smokeless tobacco history on file. He reports that he drinks alcohol. His drug history is not on file.  Allergies: No Known Allergies   (Not in a hospital admission)  Results for orders placed or performed during the hospital encounter of 02/13/15 (from the past 48 hour(s))  Comprehensive metabolic panel     Status: Abnormal   Collection Time: 02/13/15  9:45 AM  Result Value Ref Range   Sodium 140 135 - 145 mmol/L   Potassium 3.5 3.5 - 5.1 mmol/L   Chloride 110 101 - 111 mmol/L   CO2 26 22 - 32 mmol/L   Glucose, Bld 114 (H) 65 - 99 mg/dL   BUN 12 6 - 20 mg/dL   Creatinine, Ser 0.82 0.61 - 1.24 mg/dL   Calcium 8.2 (L) 8.9 - 10.3 mg/dL   Total Protein 6.5 6.5 - 8.1 g/dL   Albumin 2.0 (L) 3.5 - 5.0 g/dL   AST 26 15 - 41 U/L   ALT 25 17 - 63 U/L   Alkaline Phosphatase 70 38 - 126 U/L   Total Bilirubin 0.4 0.3 - 1.2 mg/dL   GFR calc non Af Amer >60 >60 mL/min   GFR calc Af Amer >60 >60 mL/min    Comment: (NOTE) The eGFR has been calculated using the CKD EPI equation. This calculation has not been validated in all clinical situations. eGFR's persistently <60 mL/min signify possible Chronic Kidney Disease.    Anion gap 4 (L) 5 - 15   CBC WITH DIFFERENTIAL     Status: Abnormal   Collection Time: 02/13/15  9:45 AM  Result Value Ref Range   WBC 12.9 (H) 3.8 - 10.6 K/uL   RBC 2.54 (L) 4.40 - 5.90 MIL/uL   Hemoglobin 7.6 (L) 13.0 - 18.0 g/dL   HCT 23.3 (L) 40.0 - 52.0 %   MCV 91.7 80.0 - 100.0 fL   MCH 29.9 26.0 - 34.0 pg   MCHC 32.6 32.0 - 36.0 g/dL   RDW 15.8 (H) 11.5 - 14.5 %   Platelets 756 (H) 150 - 440 K/uL   Neutrophils Relative % 85 %   Neutro Abs 11.0 (H) 1.4 - 6.5 K/uL   Lymphocytes Relative 8 %   Lymphs Abs 1.1 1.0 - 3.6 K/uL   Monocytes Relative 5 %   Monocytes Absolute 0.6 0.2 - 1.0 K/uL   Eosinophils Relative 1 %   Eosinophils Absolute 0.1 0 - 0.7 K/uL   Basophils Relative 1 %   Basophils Absolute 0.1 0 - 0.1 K/uL  Troponin I     Status: None   Collection Time: 02/13/15  9:45 AM  Result Value Ref Range   Troponin I <0.03 <0.031 ng/mL    Comment:        NO INDICATION OF MYOCARDIAL INJURY.   Ethanol     Status: None   Collection Time: 02/13/15  9:45 AM  Result Value Ref Range   Alcohol, Ethyl (B) <5 <5 mg/dL    Comment:        LOWEST DETECTABLE LIMIT FOR SERUM ALCOHOL IS 5 mg/dL FOR MEDICAL PURPOSES ONLY   Lactic acid, plasma     Status: None   Collection Time: 02/13/15  9:46 AM  Result Value Ref Range   Lactic Acid, Venous 1.8 0.5 - 2.0 mmol/L   Dg Chest Port 1 View  02/13/2015   CLINICAL DATA:  Cough and short of breath  EXAM: PORTABLE CHEST 1 VIEW  COMPARISON:  02/08/2015.  CT chest 02/03/2015  FINDINGS: Left upper lobe density shows progressive consolidation. Based on the CT there was a cavitary infiltrate in the left upper lobe. Increased cavitation is seen laterally within the lesion.  Left lower lobe remains clear. Right lung is clear. COPD with hyperinflation. Heart size and vascularity normal.  IMPRESSION: Left upper lobe process shows evolutionary changes with increased density and consolidation in the left perihilar region and increased cavitation laterally. Findings most  consistent with cavitary pneumonia. Continued follow-up is recommended to exclude underlying neoplasm or further complication.   Electronically Signed   By: Franchot Gallo M.D.   On: 02/13/2015 09:42    Review of Systems  Constitutional: Positive for fever, weight loss and malaise/fatigue. Negative for chills.  HENT: Negative for hearing loss.   Eyes: Negative for blurred vision.  Respiratory: Negative for shortness of breath.   Cardiovascular: Negative for chest pain.  Gastrointestinal: Negative for nausea and vomiting.  Genitourinary: Negative for dysuria.  Musculoskeletal: Positive for myalgias.  Skin: Negative for rash.  Neurological: Positive for tremors.  Endo/Heme/Allergies: Does not bruise/bleed easily.    Blood pressure 91/48, pulse 86, temperature 97.6 F (36.4 C), temperature source Oral, resp. rate 22, SpO2 100 %. Physical Exam  Constitutional: He is oriented to person, place, and time.  Very cachetic looking male who is trembling  HENT:  Head: Normocephalic.  Mouth/Throat: Oropharynx is clear and moist. No oropharyngeal exudate.  Eyes: EOM are normal. Pupils are equal, round, and reactive to light.  Neck: Neck supple. No JVD present. No tracheal deviation present. No thyromegaly present.  Cardiovascular:  No murmur heard. Irregular  Respiratory:  Scattered rhonci No dullness to percussion No use of accessary muscles  Musculoskeletal: He exhibits no edema or tenderness.  Lymphadenopathy:    He has no cervical adenopathy.  Neurological: He is alert and oriented to person, place, and time.  Tremor  Skin: Skin is warm and dry. No rash noted.  Poor tugar     Assessment/Plan 1. Shock: Hypovolemic vs septic. Will start back on IV Voriconazole and IV Zosyn. Will hydrate with IVF aggressively. Suspect with poor PO intake and no access to meds this is a combination of both. Monitor closely, at this point will not start pressers.   2. 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 Will restart meds. Was evaluated by CT surgery and was reccommended to follow up at [facility] for further evaluation for possible thoracotomy and resection. Not sure pt would tolerate this given his current condition.  3. Severe Malnutrition: BMI 15. Not taking PO well.   4. Bilateral Non Occlusive  Upper Ext DVT: Cannot tolerate anticoagulation because of bleeding risk from lung lesion.  5. Atrial Fib: On amiodarone for rate control. No anticoagulation sec to above.  6. Anemia of Chronic Dz: No indication for transfusion at this point. Monitor.  7. Seizure Disorder: Controlled on meds. Followed by neurology at [facility]  8.Tremor: On meds  Disposition: Pt cannot take care of himself nor can his elderly father. Have consulted social work for placement. Have also consulted palliative care.  Reviewed past medical records. This is a complex case requiring high level of medical decision making.  Time spent= 60 min  Baxter Hire 02/13/2015, 11:27 AM

## 2015-02-13 NOTE — Progress Notes (Signed)
ANTIBIOTIC CONSULT NOTE - INITIAL  Pharmacy Consult for voriconazole dosing Indication: Cavitary pneumonia   No Known Allergies  Patient Measurements:   Wt: 59.1Kg  Vital Signs: Temp: 97.6 F (36.4 C) (10/02 0910) Temp Source: Oral (10/02 0910) BP: 84/43 mmHg (10/02 1030) Pulse Rate: 88 (10/02 1030) Intake/Output from previous day:   Intake/Output from this shift: Total I/O In: 1100 [I.V.:1100] Out: -   Labs:  Recent Labs  02/13/15 0945  WBC 12.9*  HGB 7.6*  PLT 756*  CREATININE 0.82   Estimated Creatinine Clearance: 83.1 mL/min (by C-G formula based on Cr of 0.82). No results for input(s): VANCOTROUGH, VANCOPEAK, VANCORANDOM, GENTTROUGH, GENTPEAK, GENTRANDOM, TOBRATROUGH, TOBRAPEAK, TOBRARND, AMIKACINPEAK, AMIKACINTROU, AMIKACIN in the last 72 hours.   Microbiology: Recent Results (from the past 720 hour(s))  Blood culture (routine x 2)     Status: None   Collection Time: 02/01/15  7:41 AM  Result Value Ref Range Status   Specimen Description BLOOD LEFT HAND  Final   Special Requests   Final    BOTTLES DRAWN AEROBIC AND ANAEROBIC 2 CC AERO 1 CC ANAERO   Culture NO GROWTH 5 DAYS  Final   Report Status 02/06/2015 FINAL  Final  Culture, expectorated sputum-assessment     Status: None   Collection Time: 02/01/15  7:42 AM  Result Value Ref Range Status   Specimen Description EXPECTORATED SPUTUM  Final   Special Requests Immunocompromised  Final   Sputum evaluation THIS SPECIMEN IS ACCEPTABLE FOR SPUTUM CULTURE  Final   Report Status 02/01/2015 FINAL  Final  Culture, respiratory (NON-Expectorated)     Status: None (Preliminary result)   Collection Time: 02/01/15  7:42 AM  Result Value Ref Range Status   Specimen Description EXPECTORATED SPUTUM  Final   Special Requests Immunocompromised Reflexed from [id number]  Final   Gram Stain   Final    EXCELLENT SPECIMEN - 90-100% WBCS MANY WBC SEEN FEW GRAM NEGATIVE RODS MANY GRAM POSITIVE COCCI IN CHAINS    Culture MOLD  SENT TO STATE FOR IDENTIFICATION  Final   Report Status PENDING  Incomplete  Blood culture (routine x 2)     Status: None   Collection Time: 02/01/15  7:43 AM  Result Value Ref Range Status   Specimen Description BLOOD RIGHT ARM  Final   Special Requests   Final    BOTTLES DRAWN AEROBIC AND ANAEROBIC 1 CC ANAERO 4 CC AERO   Culture NO GROWTH 5 DAYS  Final   Report Status 02/06/2015 FINAL  Final  Urine culture     Status: None   Collection Time: 02/01/15  8:35 AM  Result Value Ref Range Status   Specimen Description URINE, CLEAN CATCH  Final   Special Requests NONE  Final   Culture NO GROWTH 1 DAY  Final   Report Status 02/02/2015 FINAL  Final  MRSA PCR Screening     Status: None   Collection Time: 02/01/15 12:50 PM  Result Value Ref Range Status   MRSA by PCR NEGATIVE NEGATIVE Final    Comment:        The GeneXpert MRSA Assay (FDA approved for NASAL specimens only), is one component of a comprehensive MRSA colonization surveillance program. It is not intended to diagnose MRSA infection nor to guide or monitor treatment for MRSA infections.   Culture, expectorated sputum-assessment     Status: None   Collection Time: 02/02/15 12:10 PM  Result Value Ref Range Status   Specimen Description INDUCED SPUTUM  Final  Special Requests NONE  Final   Sputum evaluation THIS SPECIMEN IS ACCEPTABLE FOR SPUTUM CULTURE  Final   Report Status 02/02/2015 FINAL  Final  Culture, respiratory (NON-Expectorated)     Status: None (Preliminary result)   Collection Time: 02/02/15 12:10 PM  Result Value Ref Range Status   Specimen Description INDUCED SPUTUM  Final   Special Requests NONE Reflexed from [id number]  Final   Gram Stain   Final    EXCELLENT SPECIMEN - 90-100% WBCS MANY WBC SEEN RARE FUNGAL ELEMENT SEEN    Culture   Final    MOLD PREVIOUS MOLD SENT TO THE STATE LABORATORY FOR IDENTIFICATION LIGHT GROWTH CANDIDA SPECIES    Report Status PENDING  Incomplete  C difficile quick scan w  PCR reflex     Status: None   Collection Time: 02/03/15 10:33 AM  Result Value Ref Range Status   C Diff antigen NEGATIVE NEGATIVE Final   C Diff toxin NEGATIVE NEGATIVE Final   C Diff interpretation Negative for C. difficile  Final    Medical History: Past Medical History  Diagnosis Date  . Seizures [facility])     Assessment: 59 yo male brought to ED via EMS for weakness and confusion. 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 Patient meets sepsis criteria (BP: 84/43, HR: 94, WBC: 12.9). Pharmacy consulted to dose voriconazole. Patient was discharged on voriconazole 200mg  q12 hours on 02/10/15.    Plan:  Will give patient initial loading dose of 350mg  (6mg /kg) followed by 240mg  (4mg /kg) every 12 hours. Pharmacy will continue to monitor patient and make adjustments and recommendations as needed.   Nancy Fetter, PharmD Pharmacy Resident

## 2015-02-13 NOTE — ED Notes (Signed)
MD at bedside. 

## 2015-02-13 NOTE — ED Provider Notes (Signed)
[facility] Emergency Department Provider Note  Time seen: 9:18 AM  I have reviewed the triage vital signs and the nursing notes.   HISTORY  Chief Complaint Cough    HPI Jonathan [name] is a 59 y.o. male with a past medical history of a seizure disorder, essential tremor, history of alcohol abuse, presents the emergency department with confusion and weakness. According to EMS report the patient was discharged 02/10/15 from [facility], he has been living with his father. Father states the patient is incredibly weak, appears confused at times, unable to care for himself, but father cannot adequately care for the patient due to age, and EMS was called. Upon EMS arrival they noted the patient to be very cachectic, with a blood pressure of 80 systolic, and brought the patient to the emergency department for further evaluation. Patient states he has been coughing for the last 1 month denies any fever, denies nausea, vomiting, abdominal pain, diarrhea, chest pain or shortness of breath. Patient does appear confused at times, and his history is limited due to this.    Past Medical History  Diagnosis Date  . Seizures     Patient Active Problem List   Diagnosis Date Noted  . Protein-calorie malnutrition, severe ([place]) 02/08/2015  . Sepsis ([place]) 02/01/2015  . Clinical depression 09/17/2012  . Benign essential tremor 10/25/2011    No past surgical history on file.  Current Outpatient Rx  Name  Route  Sig  Dispense  Refill  . amiodarone (PACERONE) 200 MG tablet   Oral   Take 1 tablet (200 mg total) by mouth daily.   30 tablet   0   . citalopram (CELEXA) 20 MG tablet   Oral   Take 20 mg by mouth every morning.         Marland Kitchen guaiFENesin (MUCINEX) 600 MG 12 hr tablet   Oral   Take 1 tablet (600 mg total) by mouth 2 (two) times daily as needed.   30 tablet   0   . primidone (MYSOLINE) 50 MG tablet   Oral   Take 200 mg by mouth at bedtime.        . topiramate (TOPAMAX) 25 MG tablet   Oral   Take 25 mg by mouth 2 (two) times daily. 1 tablet in the morning and the second tablet 6 hours after the first         . voriconazole (VFEND) 200 MG tablet   Oral   Take 1 tablet (200 mg total) by mouth every 12 (twelve) hours.   30 tablet   0     Allergies Review of patient's allergies indicates no known allergies.  No family history on file.  Social History Social History  Substance Use Topics  . Smoking status: Current Every Day Smoker  . Smokeless tobacco: Not on file  . Alcohol Use: Not on file    Review of Systems Constitutional: Negative for fever. Cardiovascular: Negative for chest pain. Respiratory: Negative for shortness of breath. positive for cough. Gastrointestinal: Negative for abdominal pain, vomiting and diarrhea. Genitourinary: Negative for dysuria. Neurological: Negative for headache 10-point ROS otherwise negative.  ____________________________________________   PHYSICAL EXAM:  VITAL SIGNS: ED Triage Vitals  Enc Vitals Group     BP 02/13/15 0910 71/40 mmHg     Pulse Rate 02/13/15 0910 104     Resp 02/13/15 0910 19     Temp 02/13/15 0910 97.6 F (36.4 C)     Temp Source  02/13/15 0910 Oral     SpO2 02/13/15 0910 100 %     Weight --      Height --      Head Cir --      Peak Flow --      Pain Score --      Pain Loc --      Pain Edu? --      Excl. in [place]? --     Constitutional:  Thin/cachectic appearing. Awake and alert, confused at times, but answering all questions and following all commands. Some answers to questions are questionable. Eyes: Normal exam ENT   Head: Normocephalic and atraumatic.   Mouth/Throat:  Dry mucous membranes. Cardiovascular:  Regular rhythm, rate around 100 110 bpm, no murmur. Respiratory:  No tachypnea, respiratory effort is normal. Mild wheeze bilaterally, mild rhonchi auscultated in the left lung fields. Gastrointestinal: Soft and nontender. No  distention.   Musculoskeletal: Nontender with normal range of motion in all extremities. mild lower extremity edema equal bilaterally. Neurologic:  Normal speech but very tangential thought process, difficult to keep the patient focused. Appears confused at times. Moves all extremities. Psychiatric:  Confusion, normal mood/affect. ____________________________________________    EKG  EKG reviewed and interpreted by myself shows sinus tachycardia 103 bpm, narrow QRS, normal axis, normal intervals, nonspecific ST changes present. No ST elevations noted.  ____________________________________________    RADIOLOGY  X-ray consistent with cavitary pneumonia  ____________________________________________    INITIAL IMPRESSION / ASSESSMENT AND PLAN / ED COURSE  Pertinent labs & imaging results that were available during my care of the patient were reviewed by me and considered in my medical decision making (see chart for details).  Patient presents with generalized weakness, unable to care for self, cough, and hypotension. Patient currently has a blood pressure in the 70s over 40s, heart rate of 105 bpm, along with cough/congestion. Given these findings I have activated our sepsis protocol. I will be dosing 2 L normal saline IV fluids, starting on empiric antibiotics while awaiting x-ray and lab results.  X-ray consistent with cavitary pneumonia. We'll continue antibiotics. 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 Anticipated admission.   Labs show mild leukocytosis, blood pressure improving with IV fluids. We will continue IV antibiotics and admitted to the hospital for pneumonia/sepsis. Patient agreeable to plan.     CRITICAL CARE Performed by: Harvest Dark   Total critical care time: 45 minutes  Critical care time was exclusive of separately billable procedures and  treating other patients.  Critical care was necessary to treat or prevent imminent or life-threatening deterioration.  Critical care was time spent personally by me on the following activities: development of treatment plan with patient and/or surrogate as well as nursing, discussions with consultants, evaluation of patient's response to treatment, examination of patient, obtaining history from patient or surrogate, ordering and performing treatments and interventions, ordering and review of laboratory studies, ordering and review of radiographic studies, pulse oximetry and re-evaluation of patient's condition.   ____________________________________________   FINAL CLINICAL IMPRESSION(S) / ED DIAGNOSES  Pneumonia  hypotension Generalized weakness Tachycardia Sepsis  Harvest Dark, MD 02/13/15 1038

## 2015-02-13 NOTE — Progress Notes (Signed)
Dr. Edwina Barth notified of BP of 90/52 and Amiodarone due. OK to give Amiodarone and 90/52 is an ok BP for the patient per Dr. Edwina Barth.

## 2015-02-13 NOTE — ED Notes (Signed)
Bib ems r/t 1 month continued cough and sob. Pt states father called ems r/t inability to care for pt.

## 2015-02-13 NOTE — ED Notes (Signed)
Pt alertx4 but slow to answer questions

## 2015-02-14 LAB — CREATININE, SERUM
Creatinine, Ser: 0.72 mg/dL (ref 0.61–1.24)
GFR calc Af Amer: >60 mL/min (ref >60)
GFR calc non Af Amer: >60 mL/min (ref >60)

## 2015-02-14 LAB — VANCOMYCIN, TROUGH: Vancomycin Tr: 16 ug/mL (ref 10–20)

## 2015-02-14 MED ORDER — ENSURE ENLIVE PO LIQD
237.0000 mL | Freq: Two times a day (BID) | ORAL | Status: DC
  Administered 2015-02-14 – 2015-02-15 (×2): 237 mL via ORAL

## 2015-02-14 NOTE — Progress Notes (Signed)
[place] [facility] [place] NAME: Jonathan [name]    MR#:  [id number]  DATE OF BIRTH:  [birth year]  SUBJECTIVE:  CHIEF COMPLAINT:   Chief Complaint  Patient presents with  . Cough   have copious sputum coming out special on changing position.  REVIEW OF SYSTEMS:  CONSTITUTIONAL: No fever, fatigue or weakness.  EYES: No blurred or double vision.  EARS, NOSE, AND THROAT: No tinnitus or ear pain.  RESPIRATORY: positive for cough, shortness of breath, wheezing or hemoptysis.  CARDIOVASCULAR: No chest pain, orthopnea, edema.  GASTROINTESTINAL: No nausea, vomiting, diarrhea or abdominal pain.  GENITOURINARY: No dysuria, hematuria.  ENDOCRINE: No polyuria, nocturia,  HEMATOLOGY: No anemia, easy bruising or bleeding SKIN: No rash or lesion. MUSCULOSKELETAL: No joint pain or arthritis.   NEUROLOGIC: No tingling, numbness, weakness.  PSYCHIATRY: No anxiety or depression.   ROS  DRUG ALLERGIES:  No Known Allergies  VITALS:  Blood pressure 102/55, pulse 85, temperature 98.2 F (36.8 C), temperature source Oral, resp. rate 17, height 5\' 9"  (1.753 m), weight 59.512 kg (131 lb 3.2 oz), SpO2 98 %.  PHYSICAL EXAMINATION:  GENERAL:  59 y.o.-year-old patient lying in the bed with no acute distress. Severe cachetic. EYES: Pupils equal, round, reactive to light and accommodation. No scleral icterus. Extraocular muscles intact.  HEENT: Head atraumatic, normocephalic. Oropharynx and nasopharynx clear.  NECK:  Supple, no jugular venous distention. No thyroid enlargement, no tenderness.  LUNGS: equal breath sounds bilaterally, no wheezing, mild crepitation. No use of accessory muscles of respiration.  CARDIOVASCULAR: S1, S2 normal. No murmurs, rubs, or gallops.  ABDOMEN: Soft, nontender, nondistended. Bowel sounds present. No organomegaly or mass.  EXTREMITIES: No pedal edema, cyanosis, or clubbing.  NEUROLOGIC: Cranial nerves II through XII are intact.  Muscle strength 5/5 in all extremities. Sensation intact. Gait not checked.  PSYCHIATRIC: The patient is alert and oriented x 3.  SKIN: No obvious rash, lesion, or ulcer.   Physical Exam LABORATORY PANEL:   CBC  Recent Labs Lab 02/13/15 0945  WBC 12.9*  HGB 7.6*  HCT 23.3*  PLT 756*   ------------------------------------------------------------------------------------------------------------------  Chemistries   Recent Labs Lab 02/09/15 0547  02/13/15 0945 02/14/15 0645  NA 138  --  140  --   K 3.8  < > 3.5  --   CL 112*  --  110  --   CO2 20*  --  26  --   GLUCOSE 80  --  114*  --   BUN 9  --  12  --   CREATININE 0.62  --  0.82 0.72  CALCIUM 7.6*  --  8.2*  --   MG 1.9  --   --   --   AST  --   --  26  --   ALT  --   --  25  --   ALKPHOS  --   --  70  --   BILITOT  --   --  0.4  --   < > = values in this interval not displayed. ------------------------------------------------------------------------------------------------------------------  Cardiac Enzymes  Recent Labs Lab 02/13/15 0945  TROPONINI <0.03   ------------------------------------------------------------------------------------------------------------------  RADIOLOGY:  Dg Chest Port 1 View  02/13/2015   CLINICAL DATA:  Cough and short of breath  EXAM: PORTABLE CHEST 1 VIEW  COMPARISON:  02/08/2015.  CT chest 02/03/2015  FINDINGS: Left upper lobe density shows progressive consolidation. Based on the CT there was a cavitary infiltrate in the left  upper lobe. Increased cavitation is seen laterally within the lesion.  Left lower lobe remains clear. Right lung is clear. COPD with hyperinflation. Heart size and vascularity normal.  IMPRESSION: Left upper lobe process shows evolutionary changes with increased density and consolidation in the left perihilar region and increased cavitation laterally. Findings most consistent with cavitary pneumonia. Continued follow-up is recommended to exclude underlying  neoplasm or further complication.   Electronically Signed   By: Franchot Gallo M.D.   On: 02/13/2015 09:42    ASSESSMENT AND PLAN:   Active Problems:   Septic shock ([place])  1. Shock: Hypovolemic vs septic. pneumonia  started back on IV Voriconazole and IV Vanc+ Zosyn.   hydrate with IVF aggressively. Suspect with poor PO intake and no access to meds this is a combination of both. Monitor closely.   2. 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  Was evaluated by CT surgery and was reccommended to follow up at [facility] for further evaluation for possible thoracotomy and resection. Not sure pt would tolerate this given his current condition.   I will call Pulmonary consult again.  3. Severe Malnutrition: BMI 15. Not taking PO well.     Will need nutritional supplementation.  4. Bilateral Non Occlusive Upper Ext DVT: Cannot tolerate anticoagulation because of bleeding risk from lung lesion.  5. Atrial Fib: On amiodarone for rate control. No anticoagulation sec to above.  6. Anemia of Chronic Dz: No indication for transfusion at this point. Monitor.  7. Seizure Disorder: Controlled on meds. Follow[facility] neurology at [facility]  8.Tremor: On meds   All the records are reviewed and case discussed with Care Management/Social Workerr. Management plans discussed with the patient, family and they are in agreement.  CODE STATUS: full.  TOTAL TIME TAKING CARE OF THIS PATIENT: 35 minutes.   POSSIBLE D/C IN 2-3 DAYS, DEPENDING ON CLINICAL CONDITION.   Vaughan Basta M.D on 02/14/2015   Between 7am to 6pm - Pager - [phone]  After 6pm go to [email or web address] - password [email or web address] [place] [facility]  [facility]  Office  [phone]  CC: Primary care physician; No PCP Per Patient  Note: This dictation was prepared with Dragon dictation along with smaller phrase technology. Any transcriptional errors that result from this process are  unintentional.

## 2015-02-14 NOTE — Clinical Social Work Note (Signed)
Clinical Social Work Assessment  Patient Details  Name: Jonathan [name] MRN: [id number] Date of Birth: [birth year]  Date of referral:  02/14/15               Reason for consult:  Abuse/Neglect, Facility Placement, Insurance Barriers                Permission sought to share information with:  Chartered certified accountant granted to share information::  Yes, Verbal Permission Granted  Name::      [place]::     Relationship::     Contact Information:     Housing/Transportation Living arrangements for the past 2 months:  [place] of Information:  Patient Patient Interpreter Needed:  None Criminal Activity/Legal Involvement Pertinent to Current Situation/Hospitalization:  No - Comment as needed Significant Relationships:  Parents Lives with:  Self Do you feel safe going back to the place where you live?  No Need for family participation in patient care:  Yes (Comment)  Care giving concerns: Patient lives alone and his elderly father Jonathan [name] can no longer take care of him.    Social Worker assessment / plan: Patient is a readmit and left [place] on 02/10/15 and came back on 02/13/15. During that time patient was at home he did not eat, drink or take any medication. Clinical Education officer, museum (CSW) met with patient to address concerns. Patient was sitting in the bed a coughing up mucus. Patient was alert and oriented however slow to answer questions. When asked why he did eat or take medication he answered "I don't know." Patient reported that he lives alone in a house at [place] that his grandfather left to him. Patient also reported that a man named Jonathan [name] is now staying in the house. Patient reported that his father usually takes care of him providing food and transportation but can no longer provide that. Per ED notes patient's father called EMS because he could not care for patient. Patient's father is the caregiver for his  elderly wife and that is why he can no longer care for his son. Patient reported that he has no income and lost unemployment in 2008. Patient reported that he got married in 1994 and his wife left him in 2012. Patient reported that he has no children. Patient was suppose to follow up with [facility] and register for the [facility]. Patient reported that he didn't know why that didn't happen. Patient has no insurance.   CSW discussed case with clinical social work Engineer, maintenance. Zack approved a 2 week LOG. CSW discussed with patient the possibility of him going to a facility. Patient reported that he needs to go to a facility until he can get to [facility].   FL2 complete and on chart. CSW made an Adult Protective Services report in [place]. RN case manager aware of above.   Employment status:  Disabled (Comment on whether or not currently receiving Disability) Insurance information:  Self Pay (Medicaid Pending) PT Recommendations:  Not assessed at this time Information / Referral to community resources:  [place], APS (Comment Required: [place], Name & Number of worker spoken with) (APS report made in [place] )  Patient/Family's Response to care: Patient does not completely understand the situation however he is agreeable to go to a facility.   Patient/Family's Understanding of and Emotional Response to Diagnosis, Current Treatment, and Prognosis: Patient was pleasant however could not answer  questions in detail.   Emotional Assessment Appearance:  Appears older than stated age, Disheveled Attitude/Demeanor/Rapport:  Inconsistent Affect (typically observed):    Orientation:  Oriented to Self, Oriented to Place, Oriented to  Time Alcohol / Substance use:  Not Applicable Psych involvement (Current and /or in the community):  No (Comment)  Discharge Needs  Concerns to be addressed:  Discharge Planning Concerns Readmission within the last 30 days:   Yes Current discharge risk:  Chronically ill Barriers to Discharge:  Continued Medical Work up   Jonathan Freshwater, LCSW 02/14/2015, 3:50 PM

## 2015-02-14 NOTE — Clinical Social Work Placement (Signed)
   CLINICAL SOCIAL WORK PLACEMENT  NOTE  Date:  02/14/2015  Patient Details  Name: Jonathan [name] MRN: [id number] Date of Birth: 22-Dec-1955  Clinical Social Work is seeking post-discharge placement for this patient at the [place] level of care (*CSW will initial, date and re-position this form in  chart as items are completed):  Yes   Patient/family provided with [place] Work Department's list of facilities offering this level of care within the geographic area requested by the patient (or if unable, by the patient's family).  Yes   Patient/family informed of their freedom to choose among providers that offer the needed level of care, that participate in Medicare, Medicaid or managed care program needed by the patient, have an available bed and are willing to accept the patient.  Yes   Patient/family informed of [place]'s ownership interest in [facility] and [facility], as well as of the fact that they are under no obligation to receive care at these facilities.  PASRR submitted to EDS on 02/14/15     PASRR number received on 02/14/15     Existing PASRR number confirmed on       FL2 transmitted to all facilities in geographic area requested by pt/family on 02/14/15     FL2 transmitted to all facilities within larger geographic area on 02/14/15     Patient informed that his/her managed care company has contracts with or will negotiate with certain facilities, including the following:            Patient/family informed of bed offers received.  Patient chooses bed at       Physician recommends and patient chooses bed at      Patient to be transferred to   on  .  Patient to be transferred to facility by       Patient family notified on   of transfer.  Name of family member notified:        PHYSICIAN Please sign FL2     Additional Comment:    _______________________________________________ Loralyn Freshwater,  LCSW 02/14/2015, 3:48 PM

## 2015-02-14 NOTE — Care Management (Signed)
Case discussed with CSW [place].  CSW to follow along with NCM for discharge plan.

## 2015-02-15 DIAGNOSIS — B449 Aspergillosis, unspecified: Secondary | ICD-10-CM

## 2015-02-15 DIAGNOSIS — E860 Dehydration: Secondary | ICD-10-CM

## 2015-02-15 LAB — CBC
HCT: 19.8 % — ABNORMAL LOW (ref 40.0–52.0)
Hemoglobin: 6.6 g/dL — ABNORMAL LOW (ref 13.0–18.0)
MCH: 30.4 pg (ref 26.0–34.0)
MCHC: 33.4 g/dL (ref 32.0–36.0)
MCV: 91 fL (ref 80.0–100.0)
PLATELETS: 676 10*3/uL — AB (ref 150–440)
RBC: 2.18 MIL/uL — AB (ref 4.40–5.90)
RDW: 16 % — AB (ref 11.5–14.5)
WBC: 10.5 10*3/uL (ref 3.8–10.6)

## 2015-02-15 LAB — URINE CULTURE

## 2015-02-15 MED ORDER — ENSURE ENLIVE PO LIQD
237.0000 mL | Freq: Two times a day (BID) | ORAL | Status: DC
Start: 2015-02-15 — End: 2015-02-18
  Administered 2015-02-15 – 2015-02-18 (×5): 237 mL via ORAL

## 2015-02-15 NOTE — Consult Note (Signed)
[facility] Infectious Disease     Reason for Consult: Fungal pna   Referring Physician: Boyce Medici Date of Admission:  02/13/2015   Active Problems:   Septic shock [facility])   HPI: Jonathan [name] is a 59 y.o. 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 Was dced just a few days ago on voriconazole but failed to fill it and represents with dehydration and cough and worsening cxr   Past Medical History  Diagnosis Date  . Seizures ([place])    History reviewed. No pertinent past surgical history. Social History  Substance Use Topics  . Smoking status: Current Every Day Smoker  . Smokeless tobacco: None  . Alcohol Use: Yes   History reviewed. No pertinent family history.  Allergies: No Known Allergies  Current antibiotics: Antibiotics Given (last 72 hours)    Date/Time Action Medication Dose Rate   02/13/15 1753 Given   piperacillin-tazobactam (ZOSYN) IVPB 3.375 g 3.375 g 12.5 mL/hr   02/13/15 1753 Given   vancomycin (VANCOCIN) IVPB 1000 mg/200 mL premix 1,000 mg 200 mL/hr   02/14/15 0525 Given   vancomycin (VANCOCIN) IVPB 1000 mg/200 mL premix 1,000 mg 200 mL/hr   02/14/15 0527 Given   piperacillin-tazobactam (ZOSYN) IVPB 3.375 g 3.375 g 12.5 mL/hr   02/14/15 1418 Given   piperacillin-tazobactam (ZOSYN) IVPB 3.375 g 3.375 g 12.5 mL/hr   02/14/15 1633 Given   vancomycin (VANCOCIN) IVPB 1000 mg/200 mL premix 1,000 mg 200 mL/hr   02/14/15 2257 Given   piperacillin-tazobactam (ZOSYN) IVPB 3.375 g 3.375 g 12.5 mL/hr   02/15/15 0504 Given   vancomycin (VANCOCIN) IVPB 1000 mg/200 mL premix 1,000 mg 200 mL/hr   02/15/15 0556 Given   piperacillin-tazobactam (ZOSYN) IVPB 3.375 g 3.375 g 12.5 mL/hr   02/15/15 1441 Given   piperacillin-tazobactam (ZOSYN) IVPB 3.375 g 3.375 g 12.5 mL/hr      MEDICATIONS: . amiodarone  200 mg Oral Daily  . citalopram  20 mg Oral BH-q7a  . feeding supplement (ENSURE ENLIVE)  237 mL Oral BID BM  . piperacillin-tazobactam  (ZOSYN)  IV  3.375 g Intravenous 3 times per day  . primidone  200 mg Oral QHS  . topiramate  25 mg Oral BID  . vancomycin  1,000 mg Intravenous Q12H  . voriconazole  4 mg/kg Intravenous Q12H    Review of Systems - 11 systems reviewed and negative per HPI   OBJECTIVE: Temp:  [98.6 F (37 C)-98.9 F (37.2 C)] 98.9 F (37.2 C) (10/04 1153) Pulse Rate:  [82-83] 82 (10/04 1153) Resp:  [17] 17 (10/04 1153) BP: (108-113)/(60-62) 108/60 mmHg (10/04 1153) SpO2:  [98 %-99 %] 99 % (10/04 1153) Physical Exam  Constitutional: He is oriented to person, place, and time. cahcectic HENT:  Mouth/Throat: Oropharynx is clear and moist. No oropharyngeal exudate.  Cardiovascular: Normal rate, regular rhythm and normal heart sounds. Exam reveals no gallop and no friction rub.  No murmur heard.  Pulmonary/Chest: rhconchi upper lobes Abdominal: Soft. Bowel sounds are normal. He exhibits no distension. There is no tenderness.  Lymphadenopathy:  He has no cervical adenopathy.  Neurological: He is alert and oriented to person, place, and time.  Skin: Skin is warm and dry. No rash noted. No erythema.  Psychiatric: He has a normal mood and affect. His behavior is normal.    LABS: Results for orders placed or performed during the hospital encounter of 02/13/15 (from the past 48 hour(s))  Creatinine, serum     Status: None  Collection Time: 02/14/15  6:45 AM  Result Value Ref Range   Creatinine, Ser 0.72 0.61 - 1.24 mg/dL   GFR calc non Af Amer >60 >60 mL/min   GFR calc Af Amer >60 >60 mL/min    Comment: (NOTE) The eGFR has been calculated using the CKD EPI equation. This calculation has not been validated in all clinical situations. eGFR's persistently <60 mL/min signify possible Chronic Kidney Disease.   Vancomycin, trough     Status: None   Collection Time: 02/14/15  4:25 PM  Result Value Ref Range   Vancomycin Tr 16 10 - 20 ug/mL  CBC     Status: Abnormal   Collection Time: 02/15/15  4:45  AM  Result Value Ref Range   WBC 10.5 3.8 - 10.6 K/uL   RBC 2.18 (L) 4.40 - 5.90 MIL/uL   Hemoglobin 6.6 (L) 13.0 - 18.0 g/dL   HCT 19.8 (L) 40.0 - 52.0 %   MCV 91.0 80.0 - 100.0 fL   MCH 30.4 26.0 - 34.0 pg   MCHC 33.4 32.0 - 36.0 g/dL   RDW 16.0 (H) 11.5 - 14.5 %   Platelets 676 (H) 150 - 440 K/uL   No components found for: ESR, C REACTIVE PROTEIN MICRO: Recent Results (from the past 720 hour(s))  Blood culture (routine x 2)     Status: None   Collection Time: 02/01/15  7:41 AM  Result Value Ref Range Status   Specimen Description BLOOD LEFT HAND  Final   Special Requests   Final    BOTTLES DRAWN AEROBIC AND ANAEROBIC 2 CC AERO 1 CC ANAERO   Culture NO GROWTH 5 DAYS  Final   Report Status 02/06/2015 FINAL  Final  Culture, expectorated sputum-assessment     Status: None   Collection Time: 02/01/15  7:42 AM  Result Value Ref Range Status   Specimen Description EXPECTORATED SPUTUM  Final   Special Requests Immunocompromised  Final   Sputum evaluation THIS SPECIMEN IS ACCEPTABLE FOR SPUTUM CULTURE  Final   Report Status 02/01/2015 FINAL  Final  Culture, respiratory (NON-Expectorated)     Status: None (Preliminary result)   Collection Time: 02/01/15  7:42 AM  Result Value Ref Range Status   Specimen Description EXPECTORATED SPUTUM  Final   Special Requests Immunocompromised Reflexed from [id number]  Final   Gram Stain   Final    EXCELLENT SPECIMEN - 90-100% WBCS MANY WBC SEEN FEW GRAM NEGATIVE RODS MANY GRAM POSITIVE COCCI IN CHAINS    Culture MOLD SENT TO STATE FOR IDENTIFICATION  Final   Report Status PENDING  Incomplete  Blood culture (routine x 2)     Status: None   Collection Time: 02/01/15  7:43 AM  Result Value Ref Range Status   Specimen Description BLOOD RIGHT ARM  Final   Special Requests   Final    BOTTLES DRAWN AEROBIC AND ANAEROBIC 1 CC ANAERO 4 CC AERO   Culture NO GROWTH 5 DAYS  Final   Report Status 02/06/2015 FINAL  Final  Urine culture     Status: None    Collection Time: 02/01/15  8:35 AM  Result Value Ref Range Status   Specimen Description URINE, CLEAN CATCH  Final   Special Requests NONE  Final   Culture NO GROWTH 1 DAY  Final   Report Status 02/02/2015 FINAL  Final  MRSA PCR Screening     Status: None   Collection Time: 02/01/15 12:50 PM  Result Value Ref Range Status  MRSA by PCR NEGATIVE NEGATIVE Final    Comment:        The GeneXpert MRSA Assay (FDA approved for NASAL specimens only), is one component of a comprehensive MRSA colonization surveillance program. It is not intended to diagnose MRSA infection nor to guide or monitor treatment for MRSA infections.   Culture, expectorated sputum-assessment     Status: None   Collection Time: 02/02/15 12:10 PM  Result Value Ref Range Status   Specimen Description INDUCED SPUTUM  Final   Special Requests NONE  Final   Sputum evaluation THIS SPECIMEN IS ACCEPTABLE FOR SPUTUM CULTURE  Final   Report Status 02/02/2015 FINAL  Final  Culture, respiratory (NON-Expectorated)     Status: None (Preliminary result)   Collection Time: 02/02/15 12:10 PM  Result Value Ref Range Status   Specimen Description INDUCED SPUTUM  Final   Special Requests NONE Reflexed from [id number]  Final   Gram Stain   Final    EXCELLENT SPECIMEN - 90-100% WBCS MANY WBC SEEN RARE FUNGAL ELEMENT SEEN    Culture   Final    MOLD PREVIOUS MOLD SENT TO THE STATE LABORATORY FOR IDENTIFICATION LIGHT GROWTH CANDIDA SPECIES    Report Status PENDING  Incomplete  C difficile quick scan w PCR reflex     Status: None   Collection Time: 02/03/15 10:33 AM  Result Value Ref Range Status   C Diff antigen NEGATIVE NEGATIVE Final   C Diff toxin NEGATIVE NEGATIVE Final   C Diff interpretation Negative for C. difficile  Final  Blood Culture (routine x 2)     Status: None (Preliminary result)   Collection Time: 02/13/15 10:14 AM  Result Value Ref Range Status   Specimen Description BLOOD RIGHT ARM  Final   Special Requests  BOTTLES DRAWN AEROBIC AND ANAEROBIC  15CC  Final   Culture NO GROWTH < 24 HOURS  Final   Report Status PENDING  Incomplete  Blood Culture (routine x 2)     Status: None (Preliminary result)   Collection Time: 02/13/15 10:15 AM  Result Value Ref Range Status   Specimen Description BLOOD RIGHT ASSIST CONTROL  Final   Special Requests BOTTLES DRAWN AEROBIC AND ANAEROBIC  4CC  Final   Culture NO GROWTH < 24 HOURS  Final   Report Status PENDING  Incomplete  Urine culture     Status: None   Collection Time: 02/13/15  1:05 PM  Result Value Ref Range Status   Specimen Description URINE, CLEAN CATCH  Final   Special Requests NONE  Final   Culture 60,000 COLONIES/ml KLEBSIELLA PNEUMONIAE  Final   Report Status 02/15/2015 FINAL  Final   Organism ID, Bacteria KLEBSIELLA PNEUMONIAE  Final      Susceptibility   Klebsiella pneumoniae - MIC*    AMPICILLIN >=32 RESISTANT Resistant     CEFAZOLIN <=4 SENSITIVE Sensitive     CEFTRIAXONE <=1 SENSITIVE Sensitive     CIPROFLOXACIN <=0.25 SENSITIVE Sensitive     GENTAMICIN <=1 SENSITIVE Sensitive     IMIPENEM <=0.25 SENSITIVE Sensitive     NITROFURANTOIN 128 RESISTANT Resistant     TRIMETH/SULFA <=20 SENSITIVE Sensitive     PIP/TAZO Value in next row Sensitive      SENSITIVE<=4    * 60,000 COLONIES/ml KLEBSIELLA PNEUMONIAE    IMAGING: Dg Chest 1 View  02/04/2015   CLINICAL DATA:  Shortness of breath.  Seizure.  EXAM: CHEST 1 VIEW  COMPARISON:  CT 02/03/2015.  Chest x-ray 02/02/2015.  FINDINGS: Right  PICC line in good anatomic position. Large cavitary lesion in the left upper lobe is again noted. Mild infiltrate in the right upper lobe and left lung base cannot be excluded. Tiny pleural effusions. No pneumothorax. Heart size normal.  IMPRESSION: 1. Persistent unchanged cavitary lesion in the left upper lobe. 2. Mild infiltrate right upper lobe and left lower lobe cannot be excluded. Tiny bilateral pleural effusions. 3. Right PICC line stable position .    Electronically Signed   By: Marcello Moores  Register   On: 02/04/2015 07:33   Dg Chest 2 View  02/08/2015   CLINICAL DATA:  Follow-up pneumonia, shortness of breath and sepsis.  EXAM: CHEST  2 VIEW  COMPARISON:  02/04/2015 chest radiograph  FINDINGS: A right PICC terminates in the lower third of the superior vena cava. Stable cardiomediastinal silhouette with normal heart size. No pneumothorax. There are stable small bilateral pleural effusions. There is no appreciable change in the large cavitary left upper lobe mass with extensive surrounding patchy consolidation. There is stable mild hazy opacities in the left lower lobe. No new focal lung consolidation. No pulmonary edema.  IMPRESSION: 1. Stable large left upper lobe cavitary mass with surrounding consolidation. Differential includes tuberculosis, fungal pneumonia and/or underlying neoplasm. Continued post treatment chest imaging follow-up is advised. 2. Stable mild hazy opacities in the left lower lobe. 3. Stable small bilateral pleural effusions.   Electronically Signed   By: Ilona Sorrel M.D.   On: 02/08/2015 07:19   Ct Chest W Contrast  02/03/2015   CLINICAL DATA:  59 year old male with acute respiratory failure likely secondary to Left-sided pneumonia. Complicated by septic shock with type II myocardial infarction due to demand ischemia, and atrial fibrillation with rapid ventricular rate.^31m OMNIPAQUE IOHEXOL 300 MG/ML SOLN.  EXAM: CT CHEST WITH CONTRAST  TECHNIQUE: Multidetector CT imaging of the chest was performed during intravenous contrast administration.  CONTRAST:  746mOMNIPAQUE IOHEXOL 300 MG/ML  SOLN  COMPARISON:  Chest x-ray 02/02/2015  FINDINGS: Heart: Heart size is normal. Coronary artery calcifications are present. Central line/PICC tip to the lower superior vena cava.  Vascular structures: Pulmonary arteries are grossly normally opacified. Thoracic aorta is normal in appearance.  Mediastinum/thyroid: The visualized portion of the thyroid  gland has a normal appearance. Mediastinal lymph nodes are present. Pretracheal lymph node is 0.9 cm. Subcarinal lymph node is 1.2 cm. The small left hilar lymph nodes are present, largest measuring approximate 1.0 cm.  Lungs/Airways: Within the left lung apex new there is a large cavitary lesion containing a rounded mass measuring 2 3.8 x 3.3 cm. 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 There is significant airspace disease surrounding this cavitary lesion, occupying the entire upper lobe. There patchy areas of infiltrate within the left lower lobe. Similar patchy densities identified within the right upper lobe. There are bilateral pleural effusions.  Upper abdomen: Possible low-attenuation lesion within the central portion of the right kidney versus hydronephrosis or parapelvic cyst. This abnormality is incompletely evaluated as it is on the last image of this study. Gallbladder is present and is contracted. There is diffuse body wall edema. Stomach is distended.  Chest wall/osseous structures: Unremarkable.  IMPRESSION: 1. 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 The cavitary lesion may be the result of fungal infection, TB, or malignancy. 2. Patchy areas of infiltrate within the right upper lobe and left lower lobe. 3. Bilateral pleural effusions. 4. Coronary artery disease. 5. Right-sided central line/PICC to the lower superior vena cava. 6.  Small mediastinal and hilar lymph nodes may be reactive. 7. Low-attenuation structure within the central portion of the right kidney is incompletely evaluated. Consider further evaluation with renal ultrasound. 8. These results will be called to the ordering clinician or representative by the Radiologist Assistant, and communication documented in the PACS or zVision Dashboard.   Electronically Signed   By: Nolon Nations M.D.   On: 02/03/2015 19:11   US Venous Img Upper Bilat  02/08/2015   CLINICAL  DATA:  Arm edema, x1 week.  Right arm PICC line.  EXAM: RIGHT UPPER EXTREMITY VENOUS DOPPLER ULTRASOUND  TECHNIQUE: Gray-scale sonography with graded compression, as well as color Doppler and duplex ultrasound were performed to evaluate the upper extremity deep venous system from the level of the subclavian vein and including the jugular, axillary, basilic and upper cephalic vein. Spectral Doppler was utilized to evaluate flow at rest and with distal augmentation maneuvers.  COMPARISON:  None.  FINDINGS: There is nonocclusive thrombus around the PICC line in the brachial, axillary, and subclavian veins. These veins are partially compressible. Unremarkable right jugular, cephalic, radial and ulnar veins. On the left the left IJ vein and subclavian vein unremarkable. Axillary vein shows normal compressibility. There is mural thrombus in a brachial vein which is partially compressible. There is some mural thrombus in the basilic vein with persistent flow. Cephalic, radial and ulnar veins remain patent.  IMPRESSION: 1. Right partially occlusive DVT involving brachial, axillary, and subclavian veins, adjacent to PICC catheter. 2. Nonocclusive mural thrombus in left brachial and basilic veins.   Electronically Signed   By: Lucrezia Europe M.D.   On: 02/08/2015 16:10   Dg Chest Port 1 View  02/13/2015   CLINICAL DATA:  Cough and short of breath  EXAM: PORTABLE CHEST 1 VIEW  COMPARISON:  02/08/2015.  CT chest 02/03/2015  FINDINGS: Left upper lobe density shows progressive consolidation. Based on the CT there was a cavitary infiltrate in the left upper lobe. Increased cavitation is seen laterally within the lesion.  Left lower lobe remains clear. Right lung is clear. COPD with hyperinflation. Heart size and vascularity normal.  IMPRESSION: Left upper lobe process shows evolutionary changes with increased density and consolidation in the left perihilar region and increased cavitation laterally. Findings most consistent with  cavitary pneumonia. Continued follow-up is recommended to exclude underlying neoplasm or further complication.   Electronically Signed   By: Franchot Gallo M.D.   On: 02/13/2015 09:42   Dg Chest Port 1 View  02/02/2015   CLINICAL DATA:  PICC line placement  EXAM: PORTABLE CHEST - 1 VIEW  COMPARISON:  02/02/2015  FINDINGS: Right PICC line has been placed.  The tip is in the SVC.  Continued dense consolidation in the left upper lobe, unchanged. Heart is normal size. No confluent opacity on the right.  IMPRESSION: Right PICC line tip in the SVC.  No change in the dense consolidation in the left upper lobe.   Electronically Signed   By: Rolm Baptise M.D.   On: 02/02/2015 15:13   Dg Chest Port 1 View  02/02/2015   CLINICAL DATA:  Pneumonia. Shortness of breath. Weight loss. Nights with  EXAM: PORTABLE CHEST - 1 VIEW  COMPARISON:  02/01/2015  FINDINGS: Dense consolidation again noted in the upper lobe, unchanged. Questionable central cavitation. No confluent opacity on the right. Heart is normal size. No visible effusions.  IMPRESSION: Stable dense consolidation in the left upper lobe with questionable areas of central cavitation.   Electronically  Signed   By: Rolm Baptise M.D.   On: 02/02/2015 11:07   Dg Chest Portable 1 View  02/01/2015   CLINICAL DATA:  Productive cough, shortness of breath, assess for TB  EXAM: PORTABLE CHEST - 1 VIEW  COMPARISON:  None.  FINDINGS: There is consolidation of the left upper lobe with cavitation. The right lung is clear. There is no pleural effusion bilaterally. The mediastinal contour and cardiac silhouette are normal. The bony structures are normal.  IMPRESSION: Consolidation of the left upper lobe with cavitation. The findings are likely infectious in nature, TB is not excluded based on the x-ray.   Electronically Signed   By: Abelardo Diesel M.D.   On: 02/01/2015 09:15    Assessment:   Jonathan [name] is a 59 y.o. male  with seizure disorder, essential tremor, chronic  heavy tobacco and etoh use now with 50# wt los in 2 years, chronic cough with sputum and hemoptysis. No history of TB contacts, prison, [employer] smears negative x 3 (Sept 2016).. WBC down 30K to 16 K. HIV neg, sputum cx neg for bacteria but + mold. QF gold indetermine. CT with extensive cavity with fungal ball. 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  Was treated with IV vanco and zosyn and dced 9/29 on voriconazole. However he never picked up vori and was readmitted 10/2 with dehydration. Cxr showed perhaps worsening.   Recommendations Will call [facility] to see if will accept Check sputum cx Continue vanco and zosyn started this admission Cont voriconazole- will need likely lifelong therapy Thank you very much for allowing me to participate in the care of this patient. Please call with questions.   Cheral Marker. Ola Spurr, MD
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
## 2015-02-15 NOTE — Consult Note (Signed)
[facility] Consultation     ASSESSMENT/PLAN   58 year old male with previous/recent  admission Left-sided pneumonia and LUL cavitary fungal infection.  Now with low blood pressure Likely from hypovolumia/dehydration.   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 -continue antifungal therapy pervious consultation by Dr Genevive Bi recommends referral to [facility] for further interventions -recommend ID follow up if needed -will start inhaled steroids meds for intermittent cough and wheezing -oxygen as needed, check ambulating pulse ox prior to discharge -no indication for bronch at this time   CARDIOVASCULAR Hypotension due to dehydration -IVF's as neeeded -continue oral intake    We will signoff at this time, please contact us with any questions or concerns.  Thank you for consulting [facility].    Name: Jonathan [name] MRN: [id number] DOB: [birth year]    ADMISSION DATE:  02/13/2015 CONSULTATION DATE:  02/02/2015.  REFERRING MD :  Dr. Benjie Karvonen  CHIEF COMPLAINT:  Low blood pressure    HISTORY OF PRESENT ILLNESS:    Patient has no new complaints today. He appears to be looking and feeling better today. Patient sitting up and eating no acute resp distress noted Some intermittent wheezing and cough(chronic)   REVIEW OF SYSTEMS:   Review of Systems  Constitutional: Negative for fever, chills, weight loss, malaise/fatigue and diaphoresis.  HENT: Negative for congestion and hearing loss.   Eyes: Negative for blurred vision and double vision.  Respiratory: Positive for cough, shortness of breath and wheezing.   Cardiovascular: Negative for chest pain, palpitations and orthopnea.  Gastrointestinal: Negative for heartburn, nausea, vomiting and abdominal pain.  Musculoskeletal: Negative for myalgias.  Skin: Negative for rash.  Neurological: Positive for tremors.  Negative for dizziness, weakness and headaches.  All other systems reviewed and are negative.    VITAL SIGNS: Temp:  [98.2 F (36.8 C)-98.6 F (37 C)] 98.6 F (37 C) (10/03 2050) Pulse Rate:  [83-85] 83 (10/03 2050) Resp:  [17] 17 (10/03 2050) BP: (102-113)/(55-62) 113/62 mmHg (10/03 2050) SpO2:  [98 %] 98 % (10/03 2050) HEMODYNAMICS:   VENTILATOR SETTINGS:   INTAKE / OUTPUT:  Intake/Output Summary (Last 24 hours) at 02/15/15 0932 Last data filed at 02/15/15 0700  Gross per 24 hour  Intake   3535 ml  Output    625 ml  Net   2910 ml    Physical Examination:   VS: BP 113/62 mmHg  Pulse 83  Temp(Src) 98.6 F (37 C) (Oral)  Resp 17  Ht 5\' 9"  (1.753 m)  Wt 131 lb 3.2 oz (59.512 kg)  BMI 19.37 kg/m2  SpO2 98%  General Appearance: No distress  Neuro:without focal findings,  HEENT: PERRLA, EOM intact, no ptosis, no other lesions noticed;  Pulmonary: normal breath sounds., diaphragmatic excursion normal.No wheezing, No rales;     CardiovascularNormal S1,S2.  No m/r/g.    Abdomen: Benign, Soft, non-tender, No masses, hepatosplenomegaly, No lymphadenopathy Renal:  No costovertebral tenderness  GU:  Not performed at this time. Endoc: No evident thyromegaly, no signs of acromegaly. Skin:   warm, no rashes, no ecchymosis  Extremities: normal, no cyanosis, clubbing, no edema, warm with normal capillary refill.    LABS: Reviewed   LABORATORY PANEL:   CBC  Recent Labs Lab 02/15/15 0445  WBC 10.5  HGB 6.6*  HCT 19.8*  PLT 676*    Chemistries   Recent Labs Lab 02/09/15 0547  02/13/15 0945 02/14/15 0645  NA 138  --  140  --   K 3.8  < > 3.5  --   CL 112*  --  110  --   CO2 20*  --  26  --   GLUCOSE 80  --  114*  --   BUN 9  --  12  --   CREATININE 0.62  --  0.82 0.72  CALCIUM 7.6*  --  8.2*  --   MG 1.9  --   --   --   AST  --   --  26  --   ALT  --   --  25  --   ALKPHOS  --   --  70  --   BILITOT  --   --  0.4  --   < > = values in this interval  not displayed.  No results for input(s): GLUCAP in the last 168 hours. No results for input(s): PHART, PCO2ART, PO2ART in the last 168 hours.  Recent Labs Lab 02/13/15 0945  AST 26  ALT 25  ALKPHOS 70  BILITOT 0.4  ALBUMIN 2.0*    Cardiac Enzymes  Recent Labs Lab 02/13/15 0945  TROPONINI <0.03    RADIOLOGY:  Dg Chest Port 1 View  02/13/2015   CLINICAL DATA:  Cough and short of breath  EXAM: PORTABLE CHEST 1 VIEW  COMPARISON:  02/08/2015.  CT chest 02/03/2015  FINDINGS: Left upper lobe density shows progressive consolidation. Based on the CT there was a cavitary infiltrate in the left upper lobe. Increased cavitation is seen laterally within the lesion.  Left lower lobe remains clear. Right lung is clear. COPD with hyperinflation. Heart size and vascularity normal.  IMPRESSION: Left upper lobe process shows evolutionary changes with increased density and consolidation in the left perihilar region and increased cavitation laterally. Findings most consistent with cavitary pneumonia. Continued follow-up is recommended to exclude underlying neoplasm or further complication.   Electronically Signed   By: Franchot Gallo M.D.   On: 02/13/2015 09:42      I have personally obtained a history, examined the patient, evaluated Pertinent laboratory and RadioGraphic/imaging results, and  formulated the assessment and plan   The Patient requires high complexity decision making for assessment and support, frequent evaluation and titration of therapies.   Patient satisfied with Plan of action and management. All questions answered  Corrin Parker, M.D.  [facility]  Medical Director [place] Director [facility] Cardio-Pulmonary Department

## 2015-02-15 NOTE — Progress Notes (Signed)
ANTIBIOTIC CONSULT NOTE -Follow up  Pharmacy Consult for Vancomycin/Zosyn Indication: rule out sepsis  No Known Allergies  Patient Measurements: Height: 5\' 9"  (175.3 cm) Weight: 131 lb 3.2 oz (59.512 kg) IBW/kg (Calculated) : 70.7  Vital Signs: Temp: 98.6 F (37 C) (10/03 2050) Temp Source: Oral (10/03 2050) BP: 113/62 mmHg (10/03 2050) Pulse Rate: 83 (10/03 2050) Intake/Output from previous day: 10/03 0701 - 10/04 0700 In: 3893.3 [P.O.:600; I.V.:2754.3; IV Piggyback:539] Out: 625 [Urine:625] Intake/Output from this shift:    Labs:  Recent Labs  02/13/15 0945 02/14/15 0645 02/15/15 0445  WBC 12.9*  --  10.5  HGB 7.6*  --  6.6*  PLT 756*  --  676*  CREATININE 0.82 0.72  --    Estimated Creatinine Clearance: 85.7 mL/min (by C-G formula based on Cr of 0.72).  Recent Labs  02/14/15 1625  [place] 16     Microbiology: Recent Results (from the past 720 hour(s))  Blood culture (routine x 2)     Status: None   Collection Time: 02/01/15  7:41 AM  Result Value Ref Range Status   Specimen Description BLOOD LEFT HAND  Final   Special Requests   Final    BOTTLES DRAWN AEROBIC AND ANAEROBIC 2 CC AERO 1 CC ANAERO   Culture NO GROWTH 5 DAYS  Final   Report Status 02/06/2015 FINAL  Final  Culture, expectorated sputum-assessment     Status: None   Collection Time: 02/01/15  7:42 AM  Result Value Ref Range Status   Specimen Description EXPECTORATED SPUTUM  Final   Special Requests Immunocompromised  Final   Sputum evaluation THIS SPECIMEN IS ACCEPTABLE FOR SPUTUM CULTURE  Final   Report Status 02/01/2015 FINAL  Final  Culture, respiratory (NON-Expectorated)     Status: None (Preliminary result)   Collection Time: 02/01/15  7:42 AM  Result Value Ref Range Status   Specimen Description EXPECTORATED SPUTUM  Final   Special Requests Immunocompromised Reflexed from [id number]  Final   Gram Stain   Final    EXCELLENT SPECIMEN - 90-100% WBCS MANY WBC SEEN FEW GRAM NEGATIVE  RODS MANY GRAM POSITIVE COCCI IN CHAINS    Culture MOLD SENT TO STATE FOR IDENTIFICATION  Final   Report Status PENDING  Incomplete  Blood culture (routine x 2)     Status: None   Collection Time: 02/01/15  7:43 AM  Result Value Ref Range Status   Specimen Description BLOOD RIGHT ARM  Final   Special Requests   Final    BOTTLES DRAWN AEROBIC AND ANAEROBIC 1 CC ANAERO 4 CC AERO   Culture NO GROWTH 5 DAYS  Final   Report Status 02/06/2015 FINAL  Final  Urine culture     Status: None   Collection Time: 02/01/15  8:35 AM  Result Value Ref Range Status   Specimen Description URINE, CLEAN CATCH  Final   Special Requests NONE  Final   Culture NO GROWTH 1 DAY  Final   Report Status 02/02/2015 FINAL  Final  MRSA PCR Screening     Status: None   Collection Time: 02/01/15 12:50 PM  Result Value Ref Range Status   MRSA by PCR NEGATIVE NEGATIVE Final    Comment:        The GeneXpert MRSA Assay (FDA approved for NASAL specimens only), is one component of a comprehensive MRSA colonization surveillance program. It is not intended to diagnose MRSA infection nor to guide or monitor treatment for MRSA infections.   Culture, expectorated sputum-assessment  Status: None   Collection Time: 02/02/15 12:10 PM  Result Value Ref Range Status   Specimen Description INDUCED SPUTUM  Final   Special Requests NONE  Final   Sputum evaluation THIS SPECIMEN IS ACCEPTABLE FOR SPUTUM CULTURE  Final   Report Status 02/02/2015 FINAL  Final  Culture, respiratory (NON-Expectorated)     Status: None (Preliminary result)   Collection Time: 02/02/15 12:10 PM  Result Value Ref Range Status   Specimen Description INDUCED SPUTUM  Final   Special Requests NONE Reflexed from [id number]  Final   Gram Stain   Final    EXCELLENT SPECIMEN - 90-100% WBCS MANY WBC SEEN RARE FUNGAL ELEMENT SEEN    Culture   Final    MOLD PREVIOUS MOLD SENT TO THE STATE LABORATORY FOR IDENTIFICATION LIGHT GROWTH CANDIDA SPECIES     Report Status PENDING  Incomplete  C difficile quick scan w PCR reflex     Status: None   Collection Time: 02/03/15 10:33 AM  Result Value Ref Range Status   C Diff antigen NEGATIVE NEGATIVE Final   C Diff toxin NEGATIVE NEGATIVE Final   C Diff interpretation Negative for C. difficile  Final  Blood Culture (routine x 2)     Status: None (Preliminary result)   Collection Time: 02/13/15 10:14 AM  Result Value Ref Range Status   Specimen Description BLOOD RIGHT ARM  Final   Special Requests BOTTLES DRAWN AEROBIC AND ANAEROBIC  15CC  Final   Culture NO GROWTH < 24 HOURS  Final   Report Status PENDING  Incomplete  Blood Culture (routine x 2)     Status: None (Preliminary result)   Collection Time: 02/13/15 10:15 AM  Result Value Ref Range Status   Specimen Description BLOOD RIGHT ASSIST CONTROL  Final   Special Requests BOTTLES DRAWN AEROBIC AND ANAEROBIC  4CC  Final   Culture NO GROWTH < 24 HOURS  Final   Report Status PENDING  Incomplete  Urine culture     Status: None (Preliminary result)   Collection Time: 02/13/15  1:05 PM  Result Value Ref Range Status   Specimen Description URINE, CLEAN CATCH  Final   Special Requests NONE  Final   Culture   Final    60,000 COLONIES/ml GRAM NEGATIVE RODS IDENTIFICATION AND SUSCEPTIBILITIES TO FOLLOW    Report Status PENDING  Incomplete    Medical History: Past Medical History  Diagnosis Date  . Seizures [facility])     Assessment: 59 yo male brought to ED via EMS for weakness and confusion. Patient was recently discharged from [facility] on [facility]18 with diagnosis of cavitary pneumonia due to aspergilloma. Patient meets sepsis criteria (BP: 84/43, HR: 94, WBC: 12.9). Pharmacy consulted to dose Vancomycin and Zosyn. Patient also receiving Voriconazole IV.  Ke: 0.073 T1/2: 9.5 Vd: 41.3   Goal of Therapy:  Vancomycin trough level 15-20 mcg/ml  Plan:  Will start patient on Zosyn 3.375 q8 hours.  Patient was given 1g in ED at 1000 will do stack  dosing 6 hours after ED infusion. Will start patient on Vancomycin regimen of 1g q12 hours. Trough ordered prior to 4th dose on 9/3 @ 1630.  Pharmacy will continue to monitor renal function and levels and make adjustments as needed.   10/4:  Vancomycin trough 10/3 at 1625 = 16 mcg/ml. Will continue current Vancomycin regimen of 1 gram IV q12h. WBC improved, afebrile. Anticipate checking Scr at least every 3 days while on Vancomycin. Will continue to follow and assess for  timing of next trough.  Chinita Greenland PharmD Clinical Pharmacist 02/15/2015  8:37 AM

## 2015-02-15 NOTE — Progress Notes (Signed)
Initial Nutrition Assessment  DOCUMENTATION CODES:   Severe malnutrition in context of chronic illness  INTERVENTION:  Meals and snacks: Cater to pt preferences.   Nutrition diet education: Discussed high protein, high calorie foods with pt.  Pt verbalized understanding, expect fair compliance Medical Nutrition Supplement Therapy: Agree with Ensure Enlive po BID, each supplement provides 350 kcal and 20 grams of protein    NUTRITION DIAGNOSIS:   Inadequate oral intake related to poor appetite as evidenced by per patient/family report, severe depletion of body fat, severe depletion of muscle mass.    GOAL:   Patient will meet greater than or equal to 90% of their needs    MONITOR:    (Energy intake, Electrolyte and renal profile)  REASON FOR ASSESSMENT:   Malnutrition Screening Tool    ASSESSMENT:      Pt admitted with septic shock, hypovolemia vs pneumonia.  Recent admission with aspergillosis  Past Medical History  Diagnosis Date  . Seizures (HCC)     Current Nutrition: ate 100% of breakfast this am (eggs, oatmeal, yogurt, bacon, peaches)  Food/Nutrition-Related History: reports appetite has been decreased for "awhile"  "I smoke and I think that makes me now want to eat."  Noted on past admission RD noted pt eats "small meals" and father checks on daily to make sure pt eats.     Medications:   Electrolyte/Renal Profile and Glucose Profile:   Recent Labs Lab 02/09/15 0547 02/10/15 0550 02/13/15 0945 02/14/15 0645  NA 138  --  140  --   K 3.8 3.9 3.5  --   CL 112*  --  110  --   CO2 20*  --  26  --   BUN 9  --  12  --   CREATININE 0.62  --  0.82 0.72  CALCIUM 7.6*  --  8.2*  --   MG 1.9  --   --   --   GLUCOSE 80  --  114*  --    Protein Profile:  Recent Labs Lab 02/13/15 0945  ALBUMIN 2.0*    Gastrointestinal Profile: Last BM:10/4   Nutrition-Focused Physical Exam Findings: Nutrition-Focused physical exam completed. Findings are  mild/moderate - severe fat depletion, moderate to severe muscle depletion, and none edema.      Weight Change: Pt reports UBW of 167 pounds but does not know how long ago was at that weight.  Current wt of 131 pounds    Diet Order:  Diet regular Room service appropriate?: Yes; Fluid consistency:: Thin  Skin:   reviewed   Height:   Ht Readings from Last 1 Encounters:  02/13/15 5\' 9"  (1.753 m)    Weight:   Wt Readings from Last 1 Encounters:  02/13/15 131 lb 3.2 oz (59.512 kg)      BMI:  Body mass index is 19.37 kg/(m^2).  Estimated Nutritional Needs:   Kcal:  BEE 1545 kcals (IF 1.1-1.3, Af 1.3) 2209-2611 kcals/d Using IBW of 73kg  Protein:  (1.1-1.3 g/kg) 80-95 g/d  Fluid:  (30-71ml/kg) 1017-5102 ml/d  EDUCATION NEEDS:   No education needs identified at this time  HIGH Care Level  Mahika Vanvoorhis B. Zenia Resides, [place], [place] (pager)

## 2015-02-15 NOTE — Progress Notes (Signed)
Spoke with Dr Duncan Dull at [facility] ID service. Pt has been accepted through transfer service to [facility] ID service.

## 2015-02-15 NOTE — Progress Notes (Signed)
[place] at [place] NAME: Jonathan [name]    MR#:  [id number]  DATE OF BIRTH:  09/04/1955  SUBJECTIVE:  CHIEF COMPLAINT:   Chief Complaint  Patient presents with  . Cough   have copious sputum coming out special on changing position.  REVIEW OF SYSTEMS:  CONSTITUTIONAL: No fever, fatigue or weakness.  EYES: No blurred or double vision.  EARS, NOSE, AND THROAT: No tinnitus or ear pain.  RESPIRATORY: positive for cough, shortness of breath, wheezing or hemoptysis.  CARDIOVASCULAR: No chest pain, orthopnea, edema.  GASTROINTESTINAL: No nausea, vomiting, diarrhea or abdominal pain.  GENITOURINARY: No dysuria, hematuria.  ENDOCRINE: No polyuria, nocturia,  HEMATOLOGY: No anemia, easy bruising or bleeding SKIN: No rash or lesion. MUSCULOSKELETAL: No joint pain or arthritis.   NEUROLOGIC: No tingling, numbness, weakness.  PSYCHIATRY: No anxiety or depression.   ROS  DRUG ALLERGIES:  No Known Allergies  VITALS:  Blood pressure 106/58, pulse 73, temperature 98.6 F (37 C), temperature source Oral, resp. rate 17, height 5\' 9"  (1.753 m), weight 59.512 kg (131 lb 3.2 oz), SpO2 98 %.  PHYSICAL EXAMINATION:  GENERAL:  59 y.o.-year-old patient lying in the bed with no acute distress. Severe cachetic. EYES: Pupils equal, round, reactive to light and accommodation. No scleral icterus. Extraocular muscles intact.  HEENT: Head atraumatic, normocephalic. Oropharynx and nasopharynx clear.  NECK:  Supple, no jugular venous distention. No thyroid enlargement, no tenderness.  LUNGS: equal breath sounds bilaterally, no wheezing, mild crepitation. No use of accessory muscles of respiration.  CARDIOVASCULAR: S1, S2 normal. No murmurs, rubs, or gallops.  ABDOMEN: Soft, nontender, nondistended. Bowel sounds present. No organomegaly or mass.  EXTREMITIES: No pedal edema, cyanosis, or clubbing.  NEUROLOGIC: Cranial nerves II through XII are intact.  Muscle strength 5/5 in all extremities. Sensation intact. Gait not checked.  PSYCHIATRIC: The patient is alert and oriented x 3.  SKIN: No obvious rash, lesion, or ulcer.   Physical Exam LABORATORY PANEL:   CBC  Recent Labs Lab 02/15/15 0445  WBC 10.5  HGB 6.6*  HCT 19.8*  PLT 676*   ------------------------------------------------------------------------------------------------------------------  Chemistries   Recent Labs Lab 02/09/15 0547  02/13/15 0945 02/14/15 0645  NA 138  --  140  --   K 3.8  < > 3.5  --   CL 112*  --  110  --   CO2 20*  --  26  --   GLUCOSE 80  --  114*  --   BUN 9  --  12  --   CREATININE 0.62  --  0.82 0.72  CALCIUM 7.6*  --  8.2*  --   MG 1.9  --   --   --   AST  --   --  26  --   ALT  --   --  25  --   ALKPHOS  --   --  70  --   BILITOT  --   --  0.4  --   < > = values in this interval not displayed. ------------------------------------------------------------------------------------------------------------------  Cardiac Enzymes  Recent Labs Lab 02/13/15 0945  TROPONINI <0.03   ------------------------------------------------------------------------------------------------------------------  RADIOLOGY:  No results found.  ASSESSMENT AND PLAN:   Active Problems:   Septic shock ([place])  1. Shock: Hypovolemic vs septic. pneumonia  started back on IV Voriconazole and IV Vanc+ Zosyn.   hydrate with IVF aggressively. Suspect with poor PO intake and no access to meds this is a  combination of both. Monitor closely.   2. 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  Was evaluated by CT surgery and was reccommended to follow up at [facility] for further evaluation for possible thoracotomy and resection. Not sure pt would tolerate this given his current condition.  Appreciated Pulmonary consult again.  For repeated infection called ID consult and appreciated his call to [facility]- Pt is accepted and  will be transferred when bed is available.  3. Severe Malnutrition: BMI 15. Not taking PO well.     Will need nutritional supplementation.  4. Bilateral Non Occlusive Upper Ext DVT: Cannot tolerate anticoagulation because of bleeding risk from lung lesion.  5. Atrial Fib: On amiodarone for rate control. No anticoagulation sec to above.  6. Anemia of Chronic Dz: No indication for transfusion at this point. Monitor.  7. Seizure Disorder: Controlled on meds. Followed by neurology at [facility]  8.Tremor: On meds   All the records are reviewed and case discussed with Care Management/Social Workerr. Management plans discussed with the patient, family and they are in agreement.  CODE STATUS: full.  TOTAL TIME TAKING CARE OF THIS PATIENT: 35 minutes.   POSSIBLE D/C IN 2-3 DAYS, DEPENDING ON CLINICAL CONDITION.   Vaughan Basta M.D on 02/15/2015   Between 7am to 6pm - Pager - [phone]  After 6pm go to [email or web address] - password [email or web address] [facility]  [facility]  Office  [phone]  CC: Primary care physician; No PCP Per Patient  Note: This dictation was prepared with Dragon dictation along with smaller phrase technology. Any transcriptional errors that result from this process are unintentional.

## 2015-02-16 DIAGNOSIS — B488 Other specified mycoses: Secondary | ICD-10-CM

## 2015-02-16 DIAGNOSIS — I959 Hypotension, unspecified: Secondary | ICD-10-CM

## 2015-02-16 DIAGNOSIS — J850 Gangrene and necrosis of lung: Secondary | ICD-10-CM

## 2015-02-16 DIAGNOSIS — I4891 Unspecified atrial fibrillation: Secondary | ICD-10-CM

## 2015-02-16 DIAGNOSIS — B4481 Allergic bronchopulmonary aspergillosis: Secondary | ICD-10-CM

## 2015-02-16 DIAGNOSIS — F0390 Unspecified dementia without behavioral disturbance: Secondary | ICD-10-CM

## 2015-02-16 DIAGNOSIS — Z681 Body mass index (BMI) 19 or less, adult: Secondary | ICD-10-CM

## 2015-02-16 DIAGNOSIS — Z515 Encounter for palliative care: Secondary | ICD-10-CM

## 2015-02-16 DIAGNOSIS — E43 Unspecified severe protein-calorie malnutrition: Secondary | ICD-10-CM

## 2015-02-16 LAB — CREATININE, SERUM
CREATININE: 0.67 mg/dL (ref 0.61–1.24)
GFR calc non Af Amer: >60 mL/min (ref >60)

## 2015-02-16 LAB — CBC
HCT: 21.4 % — ABNORMAL LOW (ref 40.0–52.0)
Hemoglobin: 6.7 g/dL — ABNORMAL LOW (ref 13.0–18.0)
MCH: 29.3 pg (ref 26.0–34.0)
MCHC: 31.2 g/dL — ABNORMAL LOW (ref 32.0–36.0)
MCV: 93.7 fL (ref 80.0–100.0)
PLATELETS: 720 10*3/uL — AB (ref 150–440)
RBC: 2.29 MIL/uL — ABNORMAL LOW (ref 4.40–5.90)
RDW: 16.2 % — AB (ref 11.5–14.5)
WBC: 14.7 10*3/uL — AB (ref 3.8–10.6)

## 2015-02-16 LAB — PREPARE RBC (CROSSMATCH): Order Confirmation: POSITIVE

## 2015-02-16 MED ORDER — MIRTAZAPINE 15 MG PO TABS
15.0000 mg | ORAL_TABLET | Freq: Every day | ORAL | Status: DC
  Administered 2015-02-17 (×2): 15 mg via ORAL
  Filled 2015-02-16 (×2): qty 1

## 2015-02-16 MED ORDER — MORPHINE SULFATE (PF) 2 MG/ML IV SOLN
2.0000 mg | Freq: Once | INTRAVENOUS | Status: AC
Start: 2015-02-16 — End: 2015-02-16
  Administered 2015-02-16: 2 mg via INTRAVENOUS
  Filled 2015-02-16: qty 1

## 2015-02-16 MED ORDER — SODIUM CHLORIDE 0.9 % IV SOLN
Freq: Once | INTRAVENOUS | Status: AC
  Administered 2015-02-16: 14:00:00 via INTRAVENOUS

## 2015-02-16 NOTE — Progress Notes (Signed)
ANTIBIOTIC CONSULT NOTE -Follow up  Pharmacy Consult for Vancomycin/Zosyn/Voriconazole Indication: rule out sepsis  No Known Allergies  Patient Measurements: Height: 5\' 9"  (175.3 cm) Weight: 131 lb 3.2 oz (59.512 kg) IBW/kg (Calculated) : 70.7  Vital Signs: Temp: 98.4 F (36.9 C) (10/05 0429) Temp Source: Oral (10/05 0429) BP: 111/62 mmHg (10/05 0429) Pulse Rate: 84 (10/05 0429) Intake/Output from previous day: 10/04 0701 - 10/05 0700 In: 3897.3 [P.O.:1240; I.V.:2511.3; IV Piggyback:146] Out: 450 [Urine:450] Intake/Output from this shift:    Labs:  Recent Labs  02/14/15 0645 02/15/15 0445 02/16/15 0506  WBC  --  10.5 14.7*  HGB  --  6.6* 6.7*  PLT  --  676* 720*  CREATININE 0.72  --  0.67   Estimated Creatinine Clearance: 85.7 mL/min (by C-G formula based on Cr of 0.67).  Recent Labs  02/14/15 1625  [place] 16     Microbiology: Recent Results (from the past 720 hour(s))  Blood culture (routine x 2)     Status: None   Collection Time: 02/01/15  7:41 AM  Result Value Ref Range Status   Specimen Description BLOOD LEFT HAND  Final   Special Requests   Final    BOTTLES DRAWN AEROBIC AND ANAEROBIC 2 CC AERO 1 CC ANAERO   Culture NO GROWTH 5 DAYS  Final   Report Status 02/06/2015 FINAL  Final  Culture, expectorated sputum-assessment     Status: None   Collection Time: 02/01/15  7:42 AM  Result Value Ref Range Status   Specimen Description EXPECTORATED SPUTUM  Final   Special Requests Immunocompromised  Final   Sputum evaluation THIS SPECIMEN IS ACCEPTABLE FOR SPUTUM CULTURE  Final   Report Status 02/01/2015 FINAL  Final  Culture, respiratory (NON-Expectorated)     Status: None (Preliminary result)   Collection Time: 02/01/15  7:42 AM  Result Value Ref Range Status   Specimen Description EXPECTORATED SPUTUM  Final   Special Requests Immunocompromised Reflexed from [id number]  Final   Gram Stain   Final    EXCELLENT SPECIMEN - 90-100% WBCS MANY WBC  SEEN FEW GRAM NEGATIVE RODS MANY GRAM POSITIVE COCCI IN CHAINS    Culture MOLD SENT TO STATE FOR IDENTIFICATION  Final   Report Status PENDING  Incomplete  Blood culture (routine x 2)     Status: None   Collection Time: 02/01/15  7:43 AM  Result Value Ref Range Status   Specimen Description BLOOD RIGHT ARM  Final   Special Requests   Final    BOTTLES DRAWN AEROBIC AND ANAEROBIC 1 CC ANAERO 4 CC AERO   Culture NO GROWTH 5 DAYS  Final   Report Status 02/06/2015 FINAL  Final  Urine culture     Status: None   Collection Time: 02/01/15  8:35 AM  Result Value Ref Range Status   Specimen Description URINE, CLEAN CATCH  Final   Special Requests NONE  Final   Culture NO GROWTH 1 DAY  Final   Report Status 02/02/2015 FINAL  Final  MRSA PCR Screening     Status: None   Collection Time: 02/01/15 12:50 PM  Result Value Ref Range Status   MRSA by PCR NEGATIVE NEGATIVE Final    Comment:        The GeneXpert MRSA Assay (FDA approved for NASAL specimens only), is one component of a comprehensive MRSA colonization surveillance program. It is not intended to diagnose MRSA infection nor to guide or monitor treatment for MRSA infections.   Culture, expectorated sputum-assessment  Status: None   Collection Time: 02/02/15 12:10 PM  Result Value Ref Range Status   Specimen Description INDUCED SPUTUM  Final   Special Requests NONE  Final   Sputum evaluation THIS SPECIMEN IS ACCEPTABLE FOR SPUTUM CULTURE  Final   Report Status 02/02/2015 FINAL  Final  Culture, respiratory (NON-Expectorated)     Status: None (Preliminary result)   Collection Time: 02/02/15 12:10 PM  Result Value Ref Range Status   Specimen Description INDUCED SPUTUM  Final   Special Requests NONE Reflexed from [id number]  Final   Gram Stain   Final    EXCELLENT SPECIMEN - 90-100% WBCS MANY WBC SEEN RARE FUNGAL ELEMENT SEEN    Culture   Final    MOLD PREVIOUS MOLD SENT TO THE STATE LABORATORY FOR IDENTIFICATION LIGHT GROWTH  CANDIDA SPECIES    Report Status PENDING  Incomplete  C difficile quick scan w PCR reflex     Status: None   Collection Time: 02/03/15 10:33 AM  Result Value Ref Range Status   C Diff antigen NEGATIVE NEGATIVE Final   C Diff toxin NEGATIVE NEGATIVE Final   C Diff interpretation Negative for C. difficile  Final  Blood Culture (routine x 2)     Status: None (Preliminary result)   Collection Time: 02/13/15 10:14 AM  Result Value Ref Range Status   Specimen Description BLOOD RIGHT ARM  Final   Special Requests BOTTLES DRAWN AEROBIC AND ANAEROBIC  [place]  Final   Culture NO GROWTH 3 DAYS  Final   Report Status PENDING  Incomplete  Blood Culture (routine x 2)     Status: None (Preliminary result)   Collection Time: 02/13/15 10:15 AM  Result Value Ref Range Status   Specimen Description BLOOD RIGHT ASSIST CONTROL  Final   Special Requests BOTTLES DRAWN AEROBIC AND ANAEROBIC  4CC  Final   Culture NO GROWTH 3 DAYS  Final   Report Status PENDING  Incomplete  Urine culture     Status: None   Collection Time: 02/13/15  1:05 PM  Result Value Ref Range Status   Specimen Description URINE, CLEAN CATCH  Final   Special Requests NONE  Final   Culture 60,000 COLONIES/ml KLEBSIELLA PNEUMONIAE  Final   Report Status 02/15/2015 FINAL  Final   Organism ID, Bacteria KLEBSIELLA PNEUMONIAE  Final      Susceptibility   Klebsiella pneumoniae - MIC*    AMPICILLIN >=32 RESISTANT Resistant     CEFAZOLIN <=4 SENSITIVE Sensitive     CEFTRIAXONE <=1 SENSITIVE Sensitive     CIPROFLOXACIN <=0.25 SENSITIVE Sensitive     GENTAMICIN <=1 SENSITIVE Sensitive     IMIPENEM <=0.25 SENSITIVE Sensitive     NITROFURANTOIN 128 RESISTANT Resistant     TRIMETH/SULFA <=20 SENSITIVE Sensitive     PIP/TAZO Value in next row Sensitive      SENSITIVE<=4    * 60,000 COLONIES/ml KLEBSIELLA PNEUMONIAE    Medical History: Past Medical History  Diagnosis Date  . Seizures [facility])     Assessment: 59 yo male brought to ED via  EMS for weakness and confusion. 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 Patient meets sepsis criteria (BP: 84/43, HR: 94, WBC: 12.9). Pharmacy consulted to dose Vancomycin and Zosyn. Patient also receiving Voriconazole IV.  Ke: 0.073 T1/2: 9.5 Vd: 41.3   Goal of Therapy:  Vancomycin trough level 15-20 mcg/ml  Plan:  Will continue patient on Zosyn 3.375 q8 hours  Will  continue voriconazole 4 mg/kg IV q12  Patient was given vancomycin 1g in ED and started on Vancomycin regimen of 1g q12 hours. Trough ordered prior to 3rd dose on 10/3 @ 1625 = 16 mcg/ml Will continue current Vancomycin regimen of 1 gram IV q12h.  Will order trough on 10/6 at 0430 to confirm level at steady state.   Pharmacy will continue to monitor renal function and levels and make adjustments as needed.     Rayna Sexton, PharmD, BCPS Clinical Pharmacist 02/16/2015 11:51 AM

## 2015-02-16 NOTE — Consult Note (Signed)
[facility] Face-to-Face Psychiatry Consult   Reason for Consult:  Consult for this 59 year old man currently in the hospital with multiple medical problems including septic shock, anemia and evidently a fungal infection in his lung. The question was framed as his ability to understand the seriousness of his condition. Referring Physician:  Anselm Jungling Patient Identification: Jonathan [name] MRN:  [id number] Principal Diagnosis: <principal problem not specified> Diagnosis:   Patient Active Problem List   Diagnosis Date Noted  . Dementia [F03.90] 02/16/2015  . Septic shock ([place]) [A41.9, R65.21] 02/13/2015  . Protein-calorie malnutrition, severe ([place]) [E43] 02/08/2015  . Sepsis ([place]) [A41.9] 02/01/2015  . Clinical depression [F32.9] 09/17/2012  . Benign essential tremor [G25.0] 10/25/2011    Total Time spent with patient: 1 hour  Subjective:   Jonathan [name] is a 59 y.o. male patient admitted with "I just don't like this tremor".  HPI:  Is a 59 year old man who appears to have had something of a complicated past history who is currently in the hospital with septic shock and severe anemia and what appears to be a fungal infection. Referral is being made for transfer to [facility] for possible surgical correction of his lung infection. The patient himself is not very easy to interview. He appears to be cognitively impaired and quite slow .he tells me that his chief complaint is his essential tremor in his hands. This appears to be a long-standing chronic problem. He claims that no one here has told him anything about any of his medical problems, but when I ask him whether a lung doctor spoke to him this morning he is able to tell me that the lung doctor came by and told him "I have a hole in my lung". He is vague about whether he is feeling depressed. At one point he denied it but then later he said that he feels bad all the time. He says he has a lot of trouble sleeping. He has not been eating well for unclear  reasons. He denies having any wish to die or to harm himself. He denies that he has been seeing or hearing things although he says that in the past there have been medications that made him see things. The patient apparently has not been uncooperative with any recommended treatment but it sounds like there is concern as to how well he has been taking care of himself.  Past psychiatric history: Patient denies ever being admitted to a psychiatric hospital. Denies any suicide attempts. His chart carries a diagnosis of depression as part of his problem list. He told me that he was unaware of that. It looks like a neurologist at [facility] may have treated him for depression but I can't find the specific notes about it. 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  Social history: Patient lives by himself. Elderly parents live nearby and help him out. He has not worked in years which she blames on his tremor. He has lived alone ever since his wife left him in 2012. He depends on his elderly parents to give him money for food and to take care of him. He has no children. 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 He did graduate from high school.  Family history: He knows of no family history of mental illness  Substance abuse history: He says that he used to drink heavily and then quit for a while but  that recently he started back up again. He said that he was drinking heavily in an attempt to gain some weight. He estimates that he was drinking about 10 beers a day.  Past Psychiatric History: Says that he saw a marriage counselor in the past. Carries a diagnosis of depression. Sounds like he's probably suffered a traumatic brain injury that is left him cognitively impaired.  Risk to Self: Is patient at risk for suicide?: No Risk to Others:   Prior Inpatient Therapy:   Prior Outpatient Therapy:    Past Medical History:  Past  Medical History  Diagnosis Date  . Seizures ([place])    History reviewed. No pertinent past surgical history. Family History: History reviewed. No pertinent family history. Family Psychiatric  History: Knows of no family history of mental illness Social History:  History  Alcohol Use  . Yes     History  Drug Use Not on file    Social History   Social History  . Marital Status: Married    Spouse Name: N/A  . Number of Children: N/A  . Years of Education: N/A   Social History Main Topics  . Smoking status: Current Every Day Smoker  . Smokeless tobacco: None  . Alcohol Use: Yes  . Drug Use: None  . Sexual Activity: Not Asked   Other Topics Concern  . None   Social History Narrative   Additional Social History:                          Allergies:  No Known Allergies  Labs:  Results for orders placed or performed during the hospital encounter of 02/13/15 (from the past 48 hour(s))  CBC     Status: Abnormal   Collection Time: 02/15/15  4:45 AM  Result Value Ref Range   WBC 10.5 3.8 - 10.6 K/uL   RBC 2.18 (L) 4.40 - 5.90 MIL/uL   Hemoglobin 6.6 (L) 13.0 - 18.0 g/dL   HCT 19.8 (L) 40.0 - 52.0 %   MCV 91.0 80.0 - 100.0 fL   MCH 30.4 26.0 - 34.0 pg   MCHC 33.4 32.0 - 36.0 g/dL   RDW 16.0 (H) 11.5 - 14.5 %   Platelets 676 (H) 150 - 440 K/uL  Creatinine, serum     Status: None   Collection Time: 02/16/15  5:06 AM  Result Value Ref Range   Creatinine, Ser 0.67 0.61 - 1.24 mg/dL   GFR calc non Af Amer >60 >60 mL/min   GFR calc Af Amer >60 >60 mL/min    Comment: (NOTE) The eGFR has been calculated using the CKD EPI equation. This calculation has not been validated in all clinical situations. eGFR's persistently <60 mL/min signify possible Chronic Kidney Disease.   CBC     Status: Abnormal   Collection Time: 02/16/15  5:06 AM  Result Value Ref Range   WBC 14.7 (H) 3.8 - 10.6 K/uL   RBC 2.29 (L) 4.40 - 5.90 MIL/uL   Hemoglobin 6.7 (L) 13.0 - 18.0 g/dL    HCT 21.4 (L) 40.0 - 52.0 %   MCV 93.7 80.0 - 100.0 fL   MCH 29.3 26.0 - 34.0 pg   MCHC 31.2 (L) 32.0 - 36.0 g/dL   RDW 16.2 (H) 11.5 - 14.5 %   Platelets 720 (H) 150 - 440 K/uL  Prepare RBC     Status: None   Collection Time: 02/16/15 12:54 PM  Result Value Ref Range  Order Confirmation ORDER PROCESSED BY BLOOD BANK POS   Type and screen     Status: None (Preliminary result)   Collection Time: 02/16/15 12:54 PM  Result Value Ref Range   ABO/RH(D) O NEG    Antibody Screen NEG    Sample Expiration 02/19/2015    Unit Number [id number]    Blood Component Type RBC, LR IRR    Unit division 00    Status of Unit ISSUED    Transfusion Status OK TO TRANSFUSE    Crossmatch Result COMPATIBLE     Current Facility-Administered Medications  Medication Dose Route Frequency Provider Last Rate Last Dose  . 0.9 %  sodium chloride infusion   Intravenous Continuous Baxter Hire, MD 125 mL/hr at 02/15/15 2340    . acetaminophen (TYLENOL) tablet 650 mg  650 mg Oral Q6H PRN Baxter Hire, MD   650 mg at 02/15/15 1506   Or  . acetaminophen (TYLENOL) suppository 650 mg  650 mg Rectal Q6H PRN Baxter Hire, MD      . amiodarone (PACERONE) tablet 200 mg  200 mg Oral Daily Baxter Hire, MD   200 mg at 02/16/15 1005  . citalopram (CELEXA) tablet 20 mg  20 mg Oral BH-q7a Baxter Hire, MD   20 mg at 02/16/15 1005  . feeding supplement (ENSURE ENLIVE) (ENSURE ENLIVE) liquid 237 mL  237 mL Oral BID BM Vaughan Basta, MD   237 mL at 02/16/15 1500  . guaiFENesin (MUCINEX) 12 hr tablet 600 mg  600 mg Oral BID PRN Baxter Hire, MD      . mirtazapine (REMERON) tablet 15 mg  15 mg Oral QHS Gonzella Lex, MD      . piperacillin-tazobactam (ZOSYN) IVPB 3.375 g  3.375 g Intravenous 3 times per day Harvest Dark, MD   3.375 g at 02/16/15 0600  . primidone (MYSOLINE) tablet 200 mg  200 mg Oral QHS Baxter Hire, MD   200 mg at 02/15/15 2059  . topiramate (TOPAMAX) tablet 25 mg  25 mg Oral  BID Baxter Hire, MD   25 mg at 02/16/15 1005  . vancomycin (VANCOCIN) IVPB 1000 mg/200 mL premix  1,000 mg Intravenous Q12H Harvest Dark, MD   1,000 mg at 02/16/15 0429  . voriconazole (VFEND) 240 mg in sodium chloride 0.9 % 100 mL IVPB  4 mg/kg Intravenous Q12H Harvest Dark, MD   240 mg at 02/16/15 1005    Musculoskeletal: Strength & Muscle Tone: decreased Gait & Station: unsteady Patient leans: N/A  Psychiatric Specialty Exam: Review of Systems  Constitutional: Positive for weight loss.  HENT: Negative.   Eyes: Negative.   Respiratory: Positive for shortness of breath.   Cardiovascular: Negative.   Gastrointestinal: Negative.   Musculoskeletal: Negative.   Skin: Negative.   Neurological: Positive for tremors and weakness.  Psychiatric/Behavioral: Positive for memory loss. Negative for depression, suicidal ideas, hallucinations and substance abuse. The patient is nervous/anxious and has insomnia.     Blood pressure 97/59, pulse 94, temperature 99.1 F (37.3 C), temperature source Oral, resp. rate 16, height 5' 9" (1.753 m), weight 59.512 kg (131 lb 3.2 oz), SpO2 98 %.Body mass index is 19.37 kg/(m^2).  General Appearance: Disheveled  Eye Contact::  Good  Speech:  Slow  Volume:  Decreased  Mood:  Dysphoric  Affect:  Flat  Thought Process:  Tangential  Orientation:  Full (Time, Place, and Person)  Thought Content:  Negative  Suicidal Thoughts:  No  Homicidal Thoughts:  No  Memory:  Immediate;   Good Recent;   Good Remote;   Fair  Judgement:  Fair  Insight:  Fair  Psychomotor Activity:  Decreased  Concentration:  Fair  Recall:  Poor  Fund of Knowledge:Fair  Language: Fair  Akathisia:  No  Handed:  Right  AIMS (if indicated):     Assets:  Desire for Improvement Housing  ADL's:  Intact  Cognition: Impaired,  Mild  Sleep:      Treatment Plan Summary: Daily contact with patient to assess and evaluate symptoms and progress in treatment, Medication  management and Plan This 59 year old man appears to have had multiple problems and is not a very good historian and has incomplete records making the full assessment difficult. My over all impression talking to him is that he is cognitively impaired in some way. He is very slow and concrete in his thinking. On the other hand he is able to remember 3 words without difficulty and remembers multiple dates without any difficulty. I think that the history of severe traumatic brain injury has probably left him with some cognitive problems of a particular kind that include difficulty taking care of himself. He appears to have a difficult time processing new information to some extent. He does not appear to be psychotic. He may have done himself some extra damage cognitively with his alcohol abuse and malnutrition. Patient has a very flat affect which makes it hard to know how strongly he is feeling about anything. He completely agreed with me that he would cooperate with the recommended treatment of his long and other medical problems. Under his current situation I think that he would not be able to take care of himself at all at home. He is probably going to need social work intervention to investigate what kind of a home living situation he is in and try and find something better for him. He will need very clear descriptions probably repeated several times about medical treatment but I don't think he is going to be uncooperative. He does request something to help him sleep better. I see that he is taking citalopram already but I'm going to add Remeron 15 mg at night. We will follow-up as needed.  Disposition: Patient does not meet criteria for psychiatric inpatient admission. Supportive therapy provided about ongoing stressors.  Ramandeep Arington 02/16/2015 6:15 PM

## 2015-02-16 NOTE — Progress Notes (Signed)
[place] [facility] [place] NAME: Jonathan [name]    MR#:  [id number]  DATE OF BIRTH:  [birth year]  SUBJECTIVE:  CHIEF COMPLAINT:   Chief Complaint  Patient presents with  . Cough   have copious sputum coming out special on changing position.  REVIEW OF SYSTEMS:  CONSTITUTIONAL: No fever, fatigue or weakness.  EYES: No blurred or double vision.  EARS, NOSE, AND THROAT: No tinnitus or ear pain.  RESPIRATORY: positive for cough, shortness of breath, wheezing or hemoptysis.  CARDIOVASCULAR: No chest pain, orthopnea, edema.  GASTROINTESTINAL: No nausea, vomiting, diarrhea or abdominal pain.  GENITOURINARY: No dysuria, hematuria.  ENDOCRINE: No polyuria, nocturia,  HEMATOLOGY: No anemia, easy bruising or bleeding SKIN: No rash or lesion. MUSCULOSKELETAL: No joint pain or arthritis.   NEUROLOGIC: No tingling, numbness, weakness.  PSYCHIATRY: No anxiety or depression.   ROS  DRUG ALLERGIES:  No Known Allergies  VITALS:  Blood pressure 111/62, pulse 84, temperature 98.4 F (36.9 C), temperature source Oral, resp. rate 17, height 5\' 9"  (1.753 m), weight 59.512 kg (131 lb 3.2 oz), SpO2 97 %.  PHYSICAL EXAMINATION:  GENERAL:  59 y.o.-year-old patient lying in the bed with no acute distress. Severe cachetic. EYES: Pupils equal, round, reactive to light and accommodation. No scleral icterus. Extraocular muscles intact.  HEENT: Head atraumatic, normocephalic. Oropharynx and nasopharynx clear.  NECK:  Supple, no jugular venous distention. No thyroid enlargement, no tenderness.  LUNGS: equal breath sounds bilaterally, no wheezing, mild crepitation. No use of accessory muscles of respiration.  CARDIOVASCULAR: S1, S2 normal. No murmurs, rubs, or gallops.  ABDOMEN: Soft, nontender, nondistended. Bowel sounds present. No organomegaly or mass.  EXTREMITIES: No pedal edema, cyanosis, or clubbing.  NEUROLOGIC: Cranial nerves II through XII are intact.  Muscle strength 5/5 in all extremities. Sensation intact. Gait not checked.  PSYCHIATRIC: The patient is alert and oriented x 3.  SKIN: No obvious rash, lesion, or ulcer.   Physical Exam LABORATORY PANEL:   CBC  Recent Labs Lab 02/16/15 0506  WBC 14.7*  HGB 6.7*  HCT 21.4*  PLT 720*   ------------------------------------------------------------------------------------------------------------------  Chemistries   Recent Labs Lab 02/13/15 0945  02/16/15 0506  NA 140  --   --   K 3.5  --   --   CL 110  --   --   CO2 26  --   --   GLUCOSE 114*  --   --   BUN 12  --   --   CREATININE 0.82  < > 0.67  CALCIUM 8.2*  --   --   AST 26  --   --   ALT 25  --   --   ALKPHOS 70  --   --   BILITOT 0.4  --   --   < > = values in this interval not displayed. ------------------------------------------------------------------------------------------------------------------  Cardiac Enzymes  Recent Labs Lab 02/13/15 0945  TROPONINI <0.03   ------------------------------------------------------------------------------------------------------------------  RADIOLOGY:  No results found.  ASSESSMENT AND PLAN:   Active Problems:   Septic shock ([place])  1. Shock: Hypovolemic vs septic. pneumonia  started back on IV Voriconazole and IV Vanc+ Zosyn.   hydrate with IVF aggressively. Suspect with poor PO intake and no access to meds this is a combination of both. Monitor closely.    Sepsis resolved now.  2. 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  Was evaluated by CT surgery and was reccommended  to follow up at [facility] for further evaluation for possible thoracotomy and resection. Not sure pt would tolerate this given his current condition.  Appreciated Pulmonary consult again.  For repeated infection called ID consult and appreciated his call to [facility]- Pt is accepted and will be transferred when bed is available.  3. Severe Malnutrition:  BMI 15. Not taking PO well.     Will need nutritional supplementation.  4. Bilateral Non Occlusive Upper Ext DVT: Cannot tolerate anticoagulation because of bleeding risk from lung lesion.  5. Atrial Fib: On amiodarone for rate control. No anticoagulation sec to above.  6. Anemia of Chronic Dz: hb dropped < 7.  discussed with pt about possible side effects of blood transfusion- including more common like low grade fever to rare and serious like renal and lung involvement and infections.  He understood- and due to necessity of transfusion- agreed to receive the transfusion.  give 1 Unit PRBC today.  7. Seizure Disorder: Controlled on meds. Followed by neurology at [facility]  8.Tremor: On meds   All the records are reviewed and case discussed with Care Management/Social Workerr. Management plans discussed with the patient, family and they are in agreement.  CODE STATUS: full.  TOTAL TIME TAKING CARE OF THIS PATIENT: 30 minutes.   POSSIBLE D/C IN 2-3 DAYS, DEPENDING ON CLINICAL CONDITION.   Vaughan Basta M.D on 02/16/2015   Between 7am to 6pm - Pager - [phone]  After 6pm go to [email or web address] - password [email or web address] [facility]  [facility]  Office  [phone]  CC: Primary care physician; No PCP Per Patient  Note: This dictation was prepared with Dragon dictation along with smaller phrase technology. Any transcriptional errors that result from this process are unintentional.

## 2015-02-16 NOTE — Discharge Summary (Addendum)
[place] at [place] NAME: Jonathan [name]    MR#:  [id number]  DATE OF BIRTH:  03-09-1956  DATE OF ADMISSION:  02/13/2015 ADMITTING PHYSICIAN: Baxter Hire, MD  DATE OF DISCHARGE: 02/18/2015  PRIMARY CARE PHYSICIAN: No PCP Per [name]    ADMISSION DIAGNOSIS:  Sepsis, due to unspecified organism ([place]) [A41.9] Aspiration pneumonia of left upper lobe, unspecified aspiration pneumonia type ([place]) [J69.0]  DISCHARGE DIAGNOSIS:  Active Problems:   Septic shock ([place])   Dementia   Fungal cavitary lesion in lung  SECONDARY DIAGNOSIS:   Past Medical History  Diagnosis Date  . Seizures [facility])    HOSPITAL COURSE:  65 yr old father cannot take care of Jonathan [name] at home was brought back to Jonathan emergency department within 2 days of discharge after 9 day hospital stay for resp failure. Found to have cavatative lung lesion that had positive fungal cultures. Pt was placed on Voriconazole but failed to get it filled as an out [name]. Has not been eating or drinking much secondary to not being able to take care of himself or is elderly father. His father brought him to ED where he was found to have BP in Jonathan 70's and slightly worsening CXR.  He has been getting IV voriconazole, vancomycin and Zosyn without significant improvement.   1. Shock: Hypovolemic vs septic. pneumonia continue IV Voriconazole, Vanco & Zosyn.  hydrate with IVF aggressively. Suspect with poor PO intake and no access to meds this is a combination of both.   2. LUL Cavitory Lung Lesion: Fungal cultures positive. is being treated for aspergillosis.  Was evaluated by CT surgery and was reccommended to follow up at [facility]/Tertiary care for further evaluation for possible thoracotomy and resection. Not sure pt would tolerate this given his current condition. Appreciated Pulmonary consult again. For repeated infection called ID consult and appreciated his call to [facility] - Pt  is accepted and will be transferred when bed is available. Accepted at [facility] - may get transferred there if beds available   3. Severe Malnutrition: BMI 15. Not taking PO well.   Will need nutritional supplementation.  4. Bilateral Non Occlusive Upper Ext DVT: Cannot tolerate anticoagulation because of bleeding risk from lung lesion.  5. Atrial Fib: On amiodarone for rate control. No anticoagulation sec to above.  6. Anemia of Chronic Dz: hb dropped < 7. required One unit of packed unit of 1 PRBC transfusion  7. Seizure Disorder: Controlled on meds. Followed by neurology at [facility]  8. Hypokalemia: Will replete and recheck. Check magnesium  Per thoracic surgery, Dr. Nestor Lewandowsky he will likely require thoracotomy with left upper lobe debridement and muscle flap transposition into Jonathan chest cavity for long-term management. Because of Jonathan intensity of service this will require transfer to tertiary care as we are unable provide that service here.   At this time: [facility] hospitalists to have accepted this [name].  Accepting Physician: Dr Estill Cotta DISCHARGE CONDITIONS:  stable CONSULTS OBTAINED:  Treatment Team:  Allyne Gee, MD Adrian Prows, MD Gonzella Lex, MD DRUG ALLERGIES:  No Known Allergies DISCHARGE MEDICATIONS:   Current Discharge Medication List    CONTINUE these medications which have NOT CHANGED   Details  amiodarone (PACERONE) 200 MG tablet Take 1 tablet (200 mg total) by mouth daily. Qty: 30 tablet, Refills: 0    citalopram (CELEXA) 20 MG tablet Take 20 mg by mouth every morning.    guaiFENesin (  MUCINEX) 600 MG 12 hr tablet Take 1 tablet (600 mg total) by mouth 2 (two) times daily as needed. Qty: 30 tablet, Refills: 0    primidone (MYSOLINE) 50 MG tablet Take 200 mg by mouth at bedtime.    topiramate (TOPAMAX) 25 MG tablet Take 25 mg by mouth 2 (two) times daily. 1 tablet in Jonathan morning and Jonathan second tablet 6 hours after Jonathan first     voriconazole (VFEND) 200 MG tablet Take 1 tablet (200 mg total) by mouth every 12 (twelve) hours. Qty: 30 tablet, Refills: 0         DISCHARGE INSTRUCTIONS:    Follow as per [facility] advise.  If you experience worsening of your admission symptoms, develop shortness of breath, life threatening emergency, suicidal or homicidal thoughts you must seek medical attention immediately by calling 911 or calling your MD immediately  if symptoms less severe.  You Must read complete instructions/literature along with all Jonathan possible adverse reactions/side effects for all Jonathan Medicines you take and that have been prescribed to you. Take any new Medicines after you have completely understood and accept all Jonathan possible adverse reactions/side effects.   Please note  You were cared for by a hospitalist during your hospital stay. If you have any questions about your discharge medications or Jonathan care you received while you were in Jonathan hospital after you are discharged, you can call Jonathan unit and asked to speak with Jonathan hospitalist on call if Jonathan hospitalist that took care of you is not available. Once you are discharged, your primary care physician will handle any further medical issues. Please note that NO REFILLS for any discharge medications will be authorized once you are discharged, as it is imperative that you return to your primary care physician (or establish a relationship with a primary care physician if you do not have one) for your aftercare needs so that they can reassess your need for medications and monitor your lab values.    Today  HISTORY OF PRESENT ILLNESS:   VITAL SIGNS:  Blood pressure 113/69, pulse 74, temperature 98.3 F (36.8 C), temperature source Oral, resp. rate 18, height 5\' 9"  (1.753 m), weight 59.512 kg (131 lb 3.2 oz), SpO2 98 %. PHYSICAL EXAMINATION:  GENERAL: 59 y.o.-year-old [name] lying in Jonathan bed with some acute respi. distress. Severe cachexia. EYES: Pupils equal,  round, reactive to light and accommodation. No scleral icterus. Extraocular muscles intact.  HEENT: Head atraumatic, normocephalic. Oropharynx and nasopharynx clear.  NECK: Supple, no jugular venous distention. No thyroid enlargement, no tenderness.  LUNGS: equal breath sounds bilaterally, no wheezing, mild crepitation. Using accessory muscles of respiration.  CARDIOVASCULAR: S1, S2 normal. No murmurs, rubs, or gallops.  ABDOMEN: Soft, nontender, nondistended. Bowel sounds present. No organomegaly or mass.  EXTREMITIES: No pedal edema, cyanosis, or clubbing.  NEUROLOGIC: Cranial nerves II through XII are intact. Muscle strength 5/5 in all extremities. Sensation intact. Gait not checked.  PSYCHIATRIC: Jonathan [name] is alert and oriented x 3.  SKIN: No obvious rash, lesion, or ulcer.  DATA REVIEW:   CBC  Recent Labs Lab 02/18/15 0559  WBC 10.8*  HGB 8.5*  HCT 25.1*  PLT 674*    Chemistries   Recent Labs Lab 02/13/15 0945  02/18/15 0559  NA 140  --  143  K 3.5  --  3.0*  CL 110  --  112*  CO2 26  --  24  GLUCOSE 114*  --  87  BUN 12  --  9  CREATININE 0.82  < > 0.73  CALCIUM 8.2*  --  8.0*  AST 26  --   --   ALT 25  --   --   ALKPHOS 70  --   --   BILITOT 0.4  --   --   < > = values in this interval not displayed.  Cardiac Enzymes  Recent Labs Lab 02/13/15 0945  TROPONINI <0.03    Microbiology Results  Results for orders placed or performed during Jonathan hospital encounter of 02/13/15  Blood Culture (routine x 2)     Status: None   Collection Time: 02/13/15 10:14 AM  Result Value Ref Range Status   Specimen Description BLOOD RIGHT ARM  Final   Special Requests BOTTLES DRAWN AEROBIC AND ANAEROBIC  [place]  Final   Culture NO GROWTH 5 DAYS  Final   Report Status 02/18/2015 FINAL  Final  Blood Culture (routine x 2)     Status: None   Collection Time: 02/13/15 10:15 AM  Result Value Ref Range Status   Specimen Description BLOOD RIGHT ASSIST CONTROL  Final    Special Requests BOTTLES DRAWN AEROBIC AND ANAEROBIC  4CC  Final   Culture NO GROWTH 5 DAYS  Final   Report Status 02/18/2015 FINAL  Final  Urine culture     Status: None   Collection Time: 02/13/15  1:05 PM  Result Value Ref Range Status   Specimen Description URINE, CLEAN CATCH  Final   Special Requests NONE  Final   Culture 60,000 COLONIES/ml KLEBSIELLA PNEUMONIAE  Final   Report Status 02/15/2015 FINAL  Final   Organism ID, Bacteria KLEBSIELLA PNEUMONIAE  Final      Susceptibility   Klebsiella pneumoniae - MIC*    AMPICILLIN >=32 RESISTANT Resistant     CEFAZOLIN <=4 SENSITIVE Sensitive     CEFTRIAXONE <=1 SENSITIVE Sensitive     CIPROFLOXACIN <=0.25 SENSITIVE Sensitive     GENTAMICIN <=1 SENSITIVE Sensitive     IMIPENEM <=0.25 SENSITIVE Sensitive     NITROFURANTOIN 128 RESISTANT Resistant     TRIMETH/SULFA <=20 SENSITIVE Sensitive     PIP/TAZO Value in next row Sensitive      SENSITIVE<=4    * 60,000 COLONIES/ml KLEBSIELLA PNEUMONIAE  Culture, expectorated sputum-assessment     Status: None   Collection Time: 02/17/15  4:54 PM  Result Value Ref Range Status   Specimen Description EXPECTORATED SPUTUM  Final   Special Requests Immunocompromised  Final   Sputum evaluation THIS SPECIMEN IS ACCEPTABLE FOR SPUTUM CULTURE  Final   Report Status 02/17/2015 FINAL  Final  Culture, respiratory (NON-Expectorated)     Status: None (Preliminary result)   Collection Time: 02/17/15  4:54 PM  Result Value Ref Range Status   Specimen Description EXPECTORATED SPUTUM  Final   Special Requests Immunocompromised Reflexed from [id number]  Final   Gram Stain PENDING  Incomplete   Culture HOLDING FOR POSSIBLE PATHOGEN  Final   Report Status PENDING  Incomplete    Management plans discussed with Jonathan [name], family and they are in agreement.  CODE STATUS: Full code  TOTAL TIME TAKING CARE OF THIS [name]: 35 minutes.    [facility], Kaenan Jake M.D on 02/18/2015 at 2:30 PM  Between 7am to 6pm - Pager -  [phone]  After 6pm go to [email or web address] - password [email or web address] [facility]  [place] [facility]  Office  [phone]  CC: Primary care physician; No PCP Per [name] Dr Estill Cotta MD Adrian Prows, MD Gonzella Lex,  MD Nestor Lewandowsky, MD  Note: This dictation was prepared with Dragon dictation along with smaller phrase technology. Any transcriptional errors that result from this process are unintentional.

## 2015-02-16 NOTE — Progress Notes (Addendum)
Palliative Care Update  I initiated a consult for this patient.    Unfortunately, he seems somewhat unable to process what I spoke to him about (I focused ONLY on code status hoping to keep conversation simple and focused).  He just said, "I don't know, I've been told different things about stuff". I will see him again while he is here, and am aware that he is likely to transfer to [facility].   My impression is that this gentleman is not able to care for himself--at least at this time.  He does not seem to approach decision making well, either.  Certainly, his very aged father with hearing, sight, and most likely some age-related judgement and memory issues cannot care for him.    He complains that he has some chest pain with coughing.  See full note to follow.  Colleen Can, MD

## 2015-02-16 NOTE — Clinical Social Work Note (Signed)
Patient currently on waiting list to be transferred to [place] received a call from Marga Melnick at [place]: [phone] stating she would be the caseworker for patient and that she was going to come up to the hospital to see patient this afternoon. Ms. Valda Lamb requested medical records to be faxed to her: [phone]. CSW had Ms. Flack speak with the RN CM that arranged patient's appointments and medication assistance on last admission to explain further what was arranged. CSW will continue to follow. Shela Leff MSW,LCSW [phone]

## 2015-02-16 NOTE — Consult Note (Signed)
Palliative Medicine Inpatient Consult Note   Name: Jonathan [name] Date: 02/16/2015 MRN: [id number]  DOB: 01/30/1956  Referring Physician: Vaughan Basta, MD  Palliative Care consult requested for this 59 y.o. male for goals of medical therapy in patient with complex serious medical conditions as outlined below:  IMPRESSION: 1. 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 soon (on the list for transfer) 2.  Necrotizing pneumonia 3.  Hypotension due to dehydration vs septic shock 4.  Severe Malnutrition with BMI of 15 5.  Bilateral non-occlusive Upper Ext DVT  ---unable to be anticoagulated due to bleeding risk with the cavitary lung lesion 6.  Atrial Fibrillation with RVR  --on Amiodarone 7.  Anemia of chronic disease 8.  Seizure disorder 9.  Alcohol Abuse 10.  Tremor 11.  Elevated troponin --error suspected as only one was up significantly 12.  Diarrhea --C Diff negative 13.  Acute Hypoxic Respiratory Failure  ---AFBs are negative ---HIV screens are negative 14.  Drug screen was positive for barbiturates on 02/01/15    TODAY'S DISCUSSIONS AND DECISIONS: Unfortunately, he seems somewhat unable to process what I spoke to him about (I focused ONLY on code status hoping to keep conversation simple and focused). He just said, "I don't know, I've been told different things about stuff". I will see him again while he is here, and am aware that he is likely to transfer to [facility].   My impression is that this gentleman is not able to care for himself--at least at this time. He does not seem to approach decision making well, either. Certainly, his very aged father with hearing, sight, and most likely some age-related judgement and memory issues cannot care for him.   He complains that he has some chest pain with  coughing.  __________________________________________________________________________________________  REVIEW OF SYSTEMS:  Patient is not able to provide ROS due to his poor comprehension of my questions and his limited ability to answer my questions.  He did tell me that when he coughs, his chest hurts.   SPIRITUAL SUPPORT SYSTEM: Yes.  SOCIAL HISTORY:  reports that he has been smoking.  He does not have any smokeless tobacco history on file. He reports that he drinks alcohol.  He lives alone.  He had been staying with his  57 yr old father who has sight and hearing problems and father could not take care of him. Pt did not fill Rx for Voriconazole (last DC summary and AVS reviewed and it appears he was given 3 pills and an RX was lined up for him at a pharmacy --but his father said the pharmacy told him they did not have this med).  Pt has been drinking about 10 beers a day.   LEGAL DOCUMENTS:  none  CODE STATUS: Full code   PAST MEDICAL HISTORY: Past Medical History  Diagnosis Date  . Seizures ([place])     PAST SURGICAL HISTORY: History reviewed. No pertinent past surgical history.  ALLERGIES:  has No Known Allergies.  MEDICATIONS:  Current Facility-Administered Medications  Medication Dose Route Frequency Provider Last Rate Last Dose  . 0.9 %  sodium chloride infusion   Intravenous Continuous Baxter Hire, MD 125 mL/hr at 02/16/15 1831    . acetaminophen (TYLENOL) tablet 650 mg  650 mg Oral Q6H PRN Baxter Hire, MD   650 mg at 02/15/15 1506   Or  . acetaminophen (TYLENOL) suppository 650 mg  650  mg Rectal Q6H PRN Baxter Hire, MD      . amiodarone (PACERONE) tablet 200 mg  200 mg Oral Daily Baxter Hire, MD   200 mg at 02/16/15 1005  . citalopram (CELEXA) tablet 20 mg  20 mg Oral BH-q7a Baxter Hire, MD   20 mg at 02/16/15 1005  . feeding supplement (ENSURE ENLIVE) (ENSURE ENLIVE) liquid 237 mL  237 mL Oral BID BM Vaughan Basta, MD   237 mL at 02/16/15 1500   . guaiFENesin (MUCINEX) 12 hr tablet 600 mg  600 mg Oral BID PRN Baxter Hire, MD      . mirtazapine (REMERON) tablet 15 mg  15 mg Oral QHS Gonzella Lex, MD      . piperacillin-tazobactam (ZOSYN) IVPB 3.375 g  3.375 g Intravenous 3 times per day Harvest Dark, MD   3.375 g at 02/16/15 0600  . primidone (MYSOLINE) tablet 200 mg  200 mg Oral QHS Baxter Hire, MD   200 mg at 02/15/15 2059  . topiramate (TOPAMAX) tablet 25 mg  25 mg Oral BID Baxter Hire, MD   25 mg at 02/16/15 1831  . vancomycin (VANCOCIN) IVPB 1000 mg/200 mL premix  1,000 mg Intravenous Q12H Harvest Dark, MD   1,000 mg at 02/16/15 0429  . voriconazole (VFEND) 240 mg in sodium chloride 0.9 % 100 mL IVPB  4 mg/kg Intravenous Q12H Harvest Dark, MD   240 mg at 02/16/15 1005    Vital Signs: BP 97/59 mmHg  Pulse 94  Temp(Src) 99.1 F (37.3 C) (Oral)  Resp 16  Ht 5\' 9"  (1.753 m)  Wt 59.512 kg (131 lb 3.2 oz)  BMI 19.37 kg/m2  SpO2 98% Filed Weights   02/13/15 1552  Weight: 59.512 kg (131 lb 3.2 oz)    Estimated body mass index is 19.37 kg/(m^2) as calculated from the following:   Height as of this encounter: 5\' 9"  (1.753 m).   Weight as of this encounter: 59.512 kg (131 lb 3.2 oz).  PERFORMANCE STATUS (ECOG) : 3 - Symptomatic, >50% confined to bed  PHYSICAL EXAM: Lying in bed in NAD Talks to me very slowly  EOMI OP clear Coughing up phlegm Hrt rrr no mgr Lungs with ronchi and coughing Abd soft with nl BS Skin w/o mottliing or cyanosis He looks cachectic  LABS: CBC:    Component Value Date/Time   WBC 14.7* 02/16/2015 0506   HGB 6.7* 02/16/2015 0506   HCT 21.4* 02/16/2015 0506   PLT 720* 02/16/2015 0506   MCV 93.7 02/16/2015 0506   NEUTROABS 11.0* 02/13/2015 0945   LYMPHSABS 1.1 02/13/2015 0945   MONOABS 0.6 02/13/2015 0945   EOSABS 0.1 02/13/2015 0945   BASOSABS 0.1 02/13/2015 0945   Comprehensive Metabolic Panel:    Component Value Date/Time   NA 140 02/13/2015 0945   K  3.5 02/13/2015 0945   CL 110 02/13/2015 0945   CO2 26 02/13/2015 0945   BUN 12 02/13/2015 0945   CREATININE 0.67 02/16/2015 0506   GLUCOSE 114* 02/13/2015 0945   CALCIUM 8.2* 02/13/2015 0945   AST 26 02/13/2015 0945   ALT 25 02/13/2015 0945   ALKPHOS 70 02/13/2015 0945   BILITOT 0.4 02/13/2015 0945   PROT 6.5 02/13/2015 0945   ALBUMIN 2.0* 02/13/2015 0945    More than 50% of the visit was spent in counseling/coordination of care: Yes  Time Spent: 70 minutes

## 2015-02-17 LAB — EXPECTORATED SPUTUM ASSESSMENT W REFEX TO RESP CULTURE

## 2015-02-17 LAB — TYPE AND SCREEN
ABO/RH(D): O NEG
Antibody Screen: NEGATIVE
UNIT DIVISION: 0

## 2015-02-17 LAB — CBC
HEMATOCRIT: 26.5 % — AB (ref 40.0–52.0)
Hemoglobin: 8.7 g/dL — ABNORMAL LOW (ref 13.0–18.0)
MCH: 29.4 pg (ref 26.0–34.0)
MCHC: 32.9 g/dL (ref 32.0–36.0)
MCV: 89.5 fL (ref 80.0–100.0)
PLATELETS: 729 10*3/uL — AB (ref 150–440)
RBC: 2.96 MIL/uL — ABNORMAL LOW (ref 4.40–5.90)
RDW: 16.6 % — AB (ref 11.5–14.5)
WBC: 15.2 10*3/uL — AB (ref 3.8–10.6)

## 2015-02-17 LAB — VANCOMYCIN, TROUGH: VANCOMYCIN TR: 10 ug/mL (ref 10–20)

## 2015-02-17 LAB — EXPECTORATED SPUTUM ASSESSMENT W GRAM STAIN, RFLX TO RESP C

## 2015-02-17 NOTE — Progress Notes (Addendum)
[place] at [place] NAME: Jonathan [name]    MR#:  [id number]  DATE OF BIRTH:  Sep 12, 1955  SUBJECTIVE:  CHIEF COMPLAINT:   Chief Complaint  Patient presents with  . Cough  about same, can't lie flat. Wants to raise bed even more (at max capacity)  REVIEW OF SYSTEMS:  CONSTITUTIONAL: No fever, fatigue or weakness.  EYES: No blurred or double vision.  EARS, NOSE, AND THROAT: No tinnitus or ear pain.  RESPIRATORY: positive for cough, shortness of breath, wheezing or hemoptysis.  CARDIOVASCULAR: No chest pain, orthopnea, edema.  GASTROINTESTINAL: No nausea, vomiting, diarrhea or abdominal pain.  GENITOURINARY: No dysuria, hematuria.  ENDOCRINE: No polyuria, nocturia,  HEMATOLOGY: No anemia, easy bruising or bleeding SKIN: No rash or lesion. MUSCULOSKELETAL: No joint pain or arthritis.   NEUROLOGIC: No tingling, numbness, weakness.  PSYCHIATRY: No anxiety or depression.   ROS  DRUG ALLERGIES:  No Known Allergies  VITALS:  Blood pressure 105/56, pulse 80, temperature 98.6 F (37 C), temperature source Oral, resp. rate 16, height 5\' 9"  (1.753 m), weight 59.512 kg (131 lb 3.2 oz), SpO2 99 %.  PHYSICAL EXAMINATION:  GENERAL:  59 y.o.-year-old patient lying in the bed with no acute distress. Severe cachetic. EYES: Pupils equal, round, reactive to light and accommodation. No scleral icterus. Extraocular muscles intact.  HEENT: Head atraumatic, normocephalic. Oropharynx and nasopharynx clear.  NECK:  Supple, no jugular venous distention. No thyroid enlargement, no tenderness.  LUNGS: equal breath sounds bilaterally, no wheezing, mild crepitation. No use of accessory muscles of respiration.  CARDIOVASCULAR: S1, S2 normal. No murmurs, rubs, or gallops.  ABDOMEN: Soft, nontender, nondistended. Bowel sounds present. No organomegaly or mass.  EXTREMITIES: No pedal edema, cyanosis, or clubbing.  NEUROLOGIC: Cranial nerves II through XII  are intact. Muscle strength 5/5 in all extremities. Sensation intact. Gait not checked.  PSYCHIATRIC: The patient is alert and oriented x 3.  SKIN: No obvious rash, lesion, or ulcer.   Physical Exam LABORATORY PANEL:   CBC  Recent Labs Lab 02/17/15 0431  WBC 15.2*  HGB 8.7*  HCT 26.5*  PLT 729*   ------------------------------------------------------------------------------------------------------------------  Chemistries   Recent Labs Lab 02/13/15 0945  02/16/15 0506  NA 140  --   --   K 3.5  --   --   CL 110  --   --   CO2 26  --   --   GLUCOSE 114*  --   --   BUN 12  --   --   CREATININE 0.82  < > 0.67  CALCIUM 8.2*  --   --   AST 26  --   --   ALT 25  --   --   ALKPHOS 70  --   --   BILITOT 0.4  --   --   < > = values in this interval not displayed. ------------------------------------------------------------------------------------------------------------------  ASSESSMENT AND PLAN:   Active Problems:   Septic shock ([facility])   Dementia  1. Shock: Hypovolemic vs septic. pneumonia  started back on IV Voriconazole and IV Vanc. Continue Zosyn.   hydrate with IVF aggressively. Suspect with poor PO intake and no access to meds this is a combination of both. Monitor closely.    Sepsis resolved now.  2. LUL Cavitory Lung Lesion: Fungal cultures positive. Was being treated for aspergillosis.  Was evaluated by CT surgery and was reccommended to follow up at [facility] for further evaluation for possible thoracotomy and resection.  Not sure pt would tolerate this given his current condition.  Appreciated Pulmonary consult again.  For repeated infection called ID consult and appreciated his call to [facility]- Pt is accepted and will be transferred when bed is available.  3. Severe Malnutrition: BMI 15. Not taking PO well.     Will need nutritional supplementation.  4. Bilateral Non Occlusive Upper Ext DVT: Cannot tolerate anticoagulation because of bleeding risk from lung  lesion.  5. Atrial Fib: On amiodarone for rate control. No anticoagulation sec to above.  6. Anemia of Chronic Dz: hb dropped < 7.  discussed with pt about possible side effects of blood transfusion- including more common like low grade fever to rare and serious like renal and lung involvement and infections.  He understood- and due to necessity of transfusion- agreed to receive the transfusion.  give 1 Unit PRBC today.  7. Seizure Disorder: Controlled on meds. Followed by neurology at [facility]  8.Tremor: On meds   Waiting for [facility] transfer  All the records are reviewed and case discussed with Care Management/Social Worker. Management plans discussed with the patient, family and they are in agreement.  CODE STATUS: full.  TOTAL TIME TAKING CARE OF THIS PATIENT: 30 minutes.   POSSIBLE D/C IN 2-3 DAYS, DEPENDING ON CLINICAL CONDITION.   [facility], Deshia Vanderhoof M.D on 02/17/2015   Between 7am to 6pm - Pager - [phone]  After 6pm go to [email or web address] - password [email or web address] [facility]  [place] [facility]  Office  [phone]  CC: Primary care physician; No PCP Per Patient  Note: This dictation was prepared with Dragon dictation along with smaller phrase technology. Any transcriptional errors that result from this process are unintentional.

## 2015-02-17 NOTE — Progress Notes (Addendum)
ANTIBIOTIC CONSULT NOTE -Follow up  Pharmacy Consult for Vancomycin/Zosyn/Voriconazole Indication: rule out sepsis  No Known Allergies  Patient Measurements: Height: 5\' 9"  (175.3 cm) Weight: 131 lb 3.2 oz (59.512 kg) IBW/kg (Calculated) : 70.7  Vital Signs: Temp: 99.2 F (37.3 C) (10/05 2244) Temp Source: Oral (10/05 2244) BP: 109/60 mmHg (10/05 2027) Pulse Rate: 67 (10/05 2027) Intake/Output from previous day: 10/05 0701 - 10/06 0700 In: 2673 [P.O.:360; I.V.:1557; Blood:633; IV Piggyback:123] Out: 400 [Urine:400] Intake/Output from this shift: Total I/O In: 1048 [P.O.:120; I.V.:492; Blood:313; IV Piggyback:123] Out: -   Labs:  Recent Labs  02/14/15 0645 02/15/15 0445 02/16/15 0506 02/17/15 0431  WBC  --  10.5 14.7* 15.2*  HGB  --  6.6* 6.7* 8.7*  PLT  --  676* 720* 729*  CREATININE 0.72  --  0.67  --    Estimated Creatinine Clearance: 85.7 mL/min (by C-G formula based on Cr of 0.67).  Recent Labs  02/14/15 1625 02/17/15 0431  [place] 16 10     Microbiology: Recent Results (from the past 720 hour(s))  Blood culture (routine x 2)     Status: None   Collection Time: 02/01/15  7:41 AM  Result Value Ref Range Status   Specimen Description BLOOD LEFT HAND  Final   Special Requests   Final    BOTTLES DRAWN AEROBIC AND ANAEROBIC 2 CC AERO 1 CC ANAERO   Culture NO GROWTH 5 DAYS  Final   Report Status 02/06/2015 FINAL  Final  Culture, expectorated sputum-assessment     Status: None   Collection Time: 02/01/15  7:42 AM  Result Value Ref Range Status   Specimen Description EXPECTORATED SPUTUM  Final   Special Requests Immunocompromised  Final   Sputum evaluation THIS SPECIMEN IS ACCEPTABLE FOR SPUTUM CULTURE  Final   Report Status 02/01/2015 FINAL  Final  Culture, respiratory (NON-Expectorated)     Status: None (Preliminary result)   Collection Time: 02/01/15  7:42 AM  Result Value Ref Range Status   Specimen Description EXPECTORATED SPUTUM  Final   Special Requests Immunocompromised Reflexed from [id number]  Final   Gram Stain   Final    EXCELLENT SPECIMEN - 90-100% WBCS MANY WBC SEEN FEW GRAM NEGATIVE RODS MANY GRAM POSITIVE COCCI IN CHAINS    Culture MOLD SENT TO STATE FOR IDENTIFICATION  Final   Report Status PENDING  Incomplete  Blood culture (routine x 2)     Status: None   Collection Time: 02/01/15  7:43 AM  Result Value Ref Range Status   Specimen Description BLOOD RIGHT ARM  Final   Special Requests   Final    BOTTLES DRAWN AEROBIC AND ANAEROBIC 1 CC ANAERO 4 CC AERO   Culture NO GROWTH 5 DAYS  Final   Report Status 02/06/2015 FINAL  Final  Urine culture     Status: None   Collection Time: 02/01/15  8:35 AM  Result Value Ref Range Status   Specimen Description URINE, CLEAN CATCH  Final   Special Requests NONE  Final   Culture NO GROWTH 1 DAY  Final   Report Status 02/02/2015 FINAL  Final  MRSA PCR Screening     Status: None   Collection Time: 02/01/15 12:50 PM  Result Value Ref Range Status   MRSA by PCR NEGATIVE NEGATIVE Final    Comment:        The GeneXpert MRSA Assay (FDA approved for NASAL specimens only), is one component of a comprehensive MRSA colonization surveillance program. It  is not intended to diagnose MRSA infection nor to guide or monitor treatment for MRSA infections.   Culture, expectorated sputum-assessment     Status: None   Collection Time: 02/02/15 12:10 PM  Result Value Ref Range Status   Specimen Description INDUCED SPUTUM  Final   Special Requests NONE  Final   Sputum evaluation THIS SPECIMEN IS ACCEPTABLE FOR SPUTUM CULTURE  Final   Report Status 02/02/2015 FINAL  Final  Culture, respiratory (NON-Expectorated)     Status: None (Preliminary result)   Collection Time: 02/02/15 12:10 PM  Result Value Ref Range Status   Specimen Description INDUCED SPUTUM  Final   Special Requests NONE Reflexed from [id number]  Final   Gram Stain   Final    EXCELLENT SPECIMEN - 90-100% WBCS MANY WBC  SEEN RARE FUNGAL ELEMENT SEEN    Culture   Final    MOLD PREVIOUS MOLD SENT TO THE STATE LABORATORY FOR IDENTIFICATION LIGHT GROWTH CANDIDA SPECIES    Report Status PENDING  Incomplete  C difficile quick scan w PCR reflex     Status: None   Collection Time: 02/03/15 10:33 AM  Result Value Ref Range Status   C Diff antigen NEGATIVE NEGATIVE Final   C Diff toxin NEGATIVE NEGATIVE Final   C Diff interpretation Negative for C. difficile  Final  Blood Culture (routine x 2)     Status: None (Preliminary result)   Collection Time: 02/13/15 10:14 AM  Result Value Ref Range Status   Specimen Description BLOOD RIGHT ARM  Final   Special Requests BOTTLES DRAWN AEROBIC AND ANAEROBIC  [place]  Final   Culture NO GROWTH 3 DAYS  Final   Report Status PENDING  Incomplete  Blood Culture (routine x 2)     Status: None (Preliminary result)   Collection Time: 02/13/15 10:15 AM  Result Value Ref Range Status   Specimen Description BLOOD RIGHT ASSIST CONTROL  Final   Special Requests BOTTLES DRAWN AEROBIC AND ANAEROBIC  4CC  Final   Culture NO GROWTH 3 DAYS  Final   Report Status PENDING  Incomplete  Urine culture     Status: None   Collection Time: 02/13/15  1:05 PM  Result Value Ref Range Status   Specimen Description URINE, CLEAN CATCH  Final   Special Requests NONE  Final   Culture 60,000 COLONIES/ml KLEBSIELLA PNEUMONIAE  Final   Report Status 02/15/2015 FINAL  Final   Organism ID, Bacteria KLEBSIELLA PNEUMONIAE  Final      Susceptibility   Klebsiella pneumoniae - MIC*    AMPICILLIN >=32 RESISTANT Resistant     CEFAZOLIN <=4 SENSITIVE Sensitive     CEFTRIAXONE <=1 SENSITIVE Sensitive     CIPROFLOXACIN <=0.25 SENSITIVE Sensitive     GENTAMICIN <=1 SENSITIVE Sensitive     IMIPENEM <=0.25 SENSITIVE Sensitive     NITROFURANTOIN 128 RESISTANT Resistant     TRIMETH/SULFA <=20 SENSITIVE Sensitive     PIP/TAZO Value in next row Sensitive      SENSITIVE<=4    * 60,000 COLONIES/ml KLEBSIELLA  PNEUMONIAE    Medical History: Past Medical History  Diagnosis Date  . Seizures [facility])     Assessment: 59 yo male brought to ED via EMS for weakness and confusion. 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 Patient meets sepsis criteria (BP: 84/43, HR: 94, WBC: 12.9). Pharmacy consulted to dose Vancomycin and Zosyn. Patient also receiving Voriconazole IV.  Ke: 0.073 T1/2: 9.5 Vd:  41.3   Goal of Therapy:  Vancomycin trough level 15-20 mcg/ml  Plan:  Will continue patient on Zosyn 3.375 q8 hours  Will continue voriconazole 4 mg/kg IV q12  Patient was given vancomycin 1g in ED and started on Vancomycin regimen of 1g q12 hours. Trough ordered prior to 3rd dose on 10/3 @ 1625 = 16 mcg/ml Will continue current Vancomycin regimen of 1 gram IV q12h.  Will order trough on 10/6 at 0430 to confirm level at steady state.   Pharmacy will continue to monitor renal function and levels and make adjustments as needed.   10/6 04:30 vanc level 10. Last dose skipped d/t transfusion. Will recheck after 3 doses.   Sim Boast, PharmD, BCPS Clinical Pharmacist 02/17/2015 5:26 AM

## 2015-02-17 NOTE — Consult Note (Signed)
[facility] Face-to-Face Psychiatry Consult   Reason for Consult:  Consult for this 59 year old man currently in the hospital with multiple medical problems including septic shock, anemia and evidently a fungal infection in his lung. The question was framed as his ability to understand the seriousness of his condition. Referring Physician:  Anselm Jungling Patient Identification: Jonathan [name] MRN:  [id number] Principal Diagnosis: <principal problem not specified> Diagnosis:   Patient Active Problem List   Diagnosis Date Noted  . Dementia [F03.90] 02/16/2015  . Septic shock ([place]) [A41.9, R65.21] 02/13/2015  . Protein-calorie malnutrition, severe ([place]) [E43] 02/08/2015  . Sepsis ([place]) [A41.9] 02/01/2015  . Clinical depression [F32.9] 09/17/2012  . Benign essential tremor [G25.0] 10/25/2011    Total Time spent with patient: 1 hour  Subjective:   Jonathan [name] is a 59 y.o. male patient admitted with "I just don't like this tremor".  HPI:  Is a 59 year old man who appears to have had something of a complicated past history who is currently in the hospital with septic shock and severe anemia and what appears to be a fungal infection. Referral is being made for transfer to [facility] for possible surgical correction of his lung infection. The patient himself is not very easy to interview. He appears to be cognitively impaired and quite slow .he tells me that his chief complaint is his essential tremor in his hands. This appears to be a long-standing chronic problem. He claims that no one here has told him anything about any of his medical problems, but when I ask him whether a lung doctor spoke to him this morning he is able to tell me that the lung doctor came by and told him "I have a hole in my lung". He is vague about whether he is feeling depressed. At one point he denied it but then later he said that he feels bad all the time. He says he has a lot of trouble sleeping. He has not been eating well for unclear  reasons. He denies having any wish to die or to harm himself. He denies that he has been seeing or hearing things although he says that in the past there have been medications that made him see things. The patient apparently has not been uncooperative with any recommended treatment but it sounds like there is concern as to how well he has been taking care of himself.  Past psychiatric history: Patient denies ever being admitted to a psychiatric hospital. Denies any suicide attempts. His chart carries a diagnosis of depression as part of his problem list. He told me that he was unaware of that. It looks like a neurologist at [facility] may have treated him for depression but I can't find the specific notes about it. 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  Social history: Patient lives by himself. Elderly parents live nearby and help him out. He has not worked in years which she blames on his tremor. He has lived alone ever since his wife left him in 2012. He depends on his elderly parents to give him money for food and to take care of him. He has no children. 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 He did graduate from high school.  Family history: He knows of no family history of mental illness  Substance abuse history: He says that he used to drink heavily and then quit for a while but  that recently he started back up again. He said that he was drinking heavily in an attempt to gain some weight. He estimates that he was drinking about 10 beers a day.  Past Psychiatric History: Says that he saw a marriage counselor in the past. Carries a diagnosis of depression. Sounds like he's probably suffered a traumatic brain injury that is left him cognitively impaired.  Update as of Thursday the sixth. Patient has no new complaints. Continues to feel weak. He is trying to eat as best he can. Has continued to be short of  breath. Denies suicidal ideation. Denies hopelessness. He is unsure whether he was sleeping any better last night. Evidently he is still waiting for possible transfer to [facility].  Risk to Self: Is patient at risk for suicide?: No Risk to Others:   Prior Inpatient Therapy:   Prior Outpatient Therapy:    Past Medical History:  Past Medical History  Diagnosis Date  . Seizures ([place])    History reviewed. No pertinent past surgical history. Family History: History reviewed. No pertinent family history. Family Psychiatric  History: Knows of no family history of mental illness Social History:  History  Alcohol Use  . Yes     History  Drug Use Not on file    Social History   Social History  . Marital Status: Married    Spouse Name: N/A  . Number of Children: N/A  . Years of Education: N/A   Social History Main Topics  . Smoking status: Current Every Day Smoker  . Smokeless tobacco: None  . Alcohol Use: Yes  . Drug Use: None  . Sexual Activity: Not Asked   Other Topics Concern  . None   Social History Narrative   Additional Social History:                          Allergies:  No Known Allergies  Labs:  Results for orders placed or performed during the hospital encounter of 02/13/15 (from the past 48 hour(s))  Creatinine, serum     Status: None   Collection Time: 02/16/15  5:06 AM  Result Value Ref Range   Creatinine, Ser 0.67 0.61 - 1.24 mg/dL   GFR calc non Af Amer >60 >60 mL/min   GFR calc Af Amer >60 >60 mL/min    Comment: (NOTE) The eGFR has been calculated using the CKD EPI equation. This calculation has not been validated in all clinical situations. eGFR's persistently <60 mL/min signify possible Chronic Kidney Disease.   CBC     Status: Abnormal   Collection Time: 02/16/15  5:06 AM  Result Value Ref Range   WBC 14.7 (H) 3.8 - 10.6 K/uL   RBC 2.29 (L) 4.40 - 5.90 MIL/uL   Hemoglobin 6.7 (L) 13.0 - 18.0 g/dL   HCT 21.4 (L) 40.0 - 52.0 %   MCV 93.7  80.0 - 100.0 fL   MCH 29.3 26.0 - 34.0 pg   MCHC 31.2 (L) 32.0 - 36.0 g/dL   RDW 16.2 (H) 11.5 - 14.5 %   Platelets 720 (H) 150 - 440 K/uL  Prepare RBC     Status: None   Collection Time: 02/16/15 12:54 PM  Result Value Ref Range   Order Confirmation ORDER PROCESSED BY BLOOD BANK POS   Type and screen     Status: None   Collection Time: 02/16/15 12:54 PM  Result Value Ref Range   ABO/RH(D) O NEG  Antibody Screen NEG    Sample Expiration 02/19/2015    Unit Number [id number]    Blood Component Type RBC, LR IRR    Unit division 00    Status of Unit ISSUED,FINAL    Transfusion Status OK TO TRANSFUSE    Crossmatch Result COMPATIBLE   Vancomycin, trough     Status: None   Collection Time: 02/17/15  4:31 AM  Result Value Ref Range   Vancomycin Tr 10 10 - 20 ug/mL  CBC     Status: Abnormal   Collection Time: 02/17/15  4:31 AM  Result Value Ref Range   WBC 15.2 (H) 3.8 - 10.6 K/uL   RBC 2.96 (L) 4.40 - 5.90 MIL/uL   Hemoglobin 8.7 (L) 13.0 - 18.0 g/dL   HCT 26.5 (L) 40.0 - 52.0 %   MCV 89.5 80.0 - 100.0 fL   MCH 29.4 26.0 - 34.0 pg   MCHC 32.9 32.0 - 36.0 g/dL   RDW 16.6 (H) 11.5 - 14.5 %   Platelets 729 (H) 150 - 440 K/uL    Current Facility-Administered Medications  Medication Dose Route Frequency Provider Last Rate Last Dose  . 0.9 %  sodium chloride infusion   Intravenous Continuous Baxter Hire, MD 125 mL/hr at 02/16/15 2030    . acetaminophen (TYLENOL) tablet 650 mg  650 mg Oral Q6H PRN Baxter Hire, MD   650 mg at 02/15/15 1506   Or  . acetaminophen (TYLENOL) suppository 650 mg  650 mg Rectal Q6H PRN Baxter Hire, MD      . amiodarone (PACERONE) tablet 200 mg  200 mg Oral Daily Baxter Hire, MD   200 mg at 02/17/15 1243  . citalopram (CELEXA) tablet 20 mg  20 mg Oral BH-q7a Baxter Hire, MD   20 mg at 02/17/15 1243  . feeding supplement (ENSURE ENLIVE) (ENSURE ENLIVE) liquid 237 mL  237 mL Oral BID BM Vaughan Basta, MD   237 mL at 02/17/15 1000   . guaiFENesin (MUCINEX) 12 hr tablet 600 mg  600 mg Oral BID PRN Baxter Hire, MD      . mirtazapine (REMERON) tablet 15 mg  15 mg Oral QHS Gonzella Lex, MD   15 mg at 02/17/15 0011  . piperacillin-tazobactam (ZOSYN) IVPB 3.375 g  3.375 g Intravenous 3 times per day Harvest Dark, MD   3.375 g at 02/17/15 0511  . primidone (MYSOLINE) tablet 200 mg  200 mg Oral QHS Baxter Hire, MD   200 mg at 02/16/15 2121  . topiramate (TOPAMAX) tablet 25 mg  25 mg Oral BID Baxter Hire, MD   25 mg at 02/17/15 1242  . vancomycin (VANCOCIN) IVPB 1000 mg/200 mL premix  1,000 mg Intravenous Q12H Harvest Dark, MD   1,000 mg at 02/17/15 0511  . voriconazole (VFEND) 240 mg in sodium chloride 0.9 % 100 mL IVPB  4 mg/kg Intravenous Q12H Harvest Dark, MD   240 mg at 02/17/15 1243    Musculoskeletal: Strength & Muscle Tone: decreased Gait & Station: unsteady Patient leans: N/A  Psychiatric Specialty Exam: Review of Systems  Constitutional: Positive for weight loss.  HENT: Negative.   Eyes: Negative.   Respiratory: Positive for shortness of breath.   Cardiovascular: Negative.   Gastrointestinal: Negative.   Musculoskeletal: Negative.   Skin: Negative.   Neurological: Positive for tremors and weakness.  Psychiatric/Behavioral: Positive for memory loss. Negative for depression, suicidal ideas, hallucinations and substance abuse. The patient is nervous/anxious  and has insomnia.     Blood pressure 105/56, pulse 80, temperature 98.6 F (37 C), temperature source Oral, resp. rate 16, height $RemoveBe'5\' 9"'bBrUGOHki$  (1.753 m), weight 59.512 kg (131 lb 3.2 oz), SpO2 99 %.Body mass index is 19.37 kg/(m^2).  General Appearance: Disheveled  Eye Contact::  Good  Speech:  Slow  Volume:  Decreased  Mood:  Dysphoric  Affect:  Flat  Thought Process:  Tangential  Orientation:  Full (Time, Place, and Person)  Thought Content:  Negative  Suicidal Thoughts:  No  Homicidal Thoughts:  No  Memory:  Immediate;    Good Recent;   Good Remote;   Fair  Judgement:  Fair  Insight:  Fair  Psychomotor Activity:  Decreased  Concentration:  Fair  Recall:  Poor  Fund of Knowledge:Fair  Language: Fair  Akathisia:  No  Handed:  Right  AIMS (if indicated):     Assets:  Desire for Improvement Housing  ADL's:  Intact  Cognition: Impaired,  Mild  Sleep:      Treatment Plan Summary: Daily contact with patient to assess and evaluate symptoms and progress in treatment, Medication management and Plan I will continue the mirtazapine 15 mg at night to try and help with sleep. This should be safe with little risk of impairing respiratory function. Supportive counseling. No other change to treatment at this point. We will continue to follow as needed. I continue to think that this patient should be considered to have capacity to make judgments about his care and less there were a specific concern otherwise.  Disposition: Patient does not meet criteria for psychiatric inpatient admission. Supportive therapy provided about ongoing stressors.  Aaren Atallah 02/17/2015 5:40 PM

## 2015-02-17 NOTE — Clinical Social Work Note (Signed)
Psychiatry assessed patient and it has come to light that patient has had a TBI in the past. CSW has left message for Trecia Rogers in Patient Financial Services to inform her of this and to see where they might be with a medicaid/disability application. Shela Leff MSW,LCSW [phone]

## 2015-02-18 ENCOUNTER — Inpatient Hospital Stay (HOSPITAL_COMMUNITY)
Admission: AD | Admit: 2015-02-18 | Discharge: 2015-02-25 | DRG: 867 | Disposition: A | Discharge status: Skilled Nursing Facility | Payer: Medicaid Other | Source: Other Acute Inpatient Hospital | Attending: Internal Medicine | Admitting: Internal Medicine

## 2015-02-18 ENCOUNTER — Encounter (HOSPITAL_COMMUNITY): Payer: Self-pay | Admitting: Internal Medicine

## 2015-02-18 DIAGNOSIS — D638 Anemia in other chronic diseases classified elsewhere: Secondary | ICD-10-CM | POA: Diagnosis not present

## 2015-02-18 DIAGNOSIS — D473 Essential (hemorrhagic) thrombocythemia: Secondary | ICD-10-CM | POA: Diagnosis not present

## 2015-02-18 DIAGNOSIS — E440 Moderate protein-calorie malnutrition: Secondary | ICD-10-CM | POA: Insufficient documentation

## 2015-02-18 DIAGNOSIS — I82619 Acute embolism and thrombosis of superficial veins of unspecified upper extremity: Secondary | ICD-10-CM | POA: Diagnosis present

## 2015-02-18 DIAGNOSIS — Z9119 Patient's noncompliance with other medical treatment and regimen: Secondary | ICD-10-CM

## 2015-02-18 DIAGNOSIS — B441 Other pulmonary aspergillosis: Principal | ICD-10-CM | POA: Diagnosis present

## 2015-02-18 DIAGNOSIS — F329 Major depressive disorder, single episode, unspecified: Secondary | ICD-10-CM | POA: Diagnosis present

## 2015-02-18 DIAGNOSIS — G250 Essential tremor: Secondary | ICD-10-CM | POA: Diagnosis not present

## 2015-02-18 DIAGNOSIS — F1020 Alcohol dependence, uncomplicated: Secondary | ICD-10-CM | POA: Diagnosis not present

## 2015-02-18 DIAGNOSIS — E43 Unspecified severe protein-calorie malnutrition: Secondary | ICD-10-CM | POA: Diagnosis present

## 2015-02-18 DIAGNOSIS — E876 Hypokalemia: Secondary | ICD-10-CM | POA: Diagnosis present

## 2015-02-18 DIAGNOSIS — E538 Deficiency of other specified B group vitamins: Secondary | ICD-10-CM | POA: Diagnosis present

## 2015-02-18 DIAGNOSIS — B449 Aspergillosis, unspecified: Secondary | ICD-10-CM | POA: Diagnosis present

## 2015-02-18 DIAGNOSIS — G40909 Epilepsy, unspecified, not intractable, without status epilepticus: Secondary | ICD-10-CM | POA: Diagnosis not present

## 2015-02-18 DIAGNOSIS — I82629 Acute embolism and thrombosis of deep veins of unspecified upper extremity: Secondary | ICD-10-CM | POA: Diagnosis present

## 2015-02-18 DIAGNOSIS — F17200 Nicotine dependence, unspecified, uncomplicated: Secondary | ICD-10-CM | POA: Diagnosis not present

## 2015-02-18 DIAGNOSIS — D75839 Thrombocytosis, unspecified: Secondary | ICD-10-CM | POA: Diagnosis present

## 2015-02-18 DIAGNOSIS — Z87820 Personal history of traumatic brain injury: Secondary | ICD-10-CM

## 2015-02-18 DIAGNOSIS — Z681 Body mass index (BMI) 19 or less, adult: Secondary | ICD-10-CM

## 2015-02-18 DIAGNOSIS — R627 Adult failure to thrive: Secondary | ICD-10-CM | POA: Diagnosis present

## 2015-02-18 DIAGNOSIS — I480 Paroxysmal atrial fibrillation: Secondary | ICD-10-CM | POA: Diagnosis present

## 2015-02-18 DIAGNOSIS — F0390 Unspecified dementia without behavioral disturbance: Secondary | ICD-10-CM | POA: Diagnosis present

## 2015-02-18 DIAGNOSIS — R0600 Dyspnea, unspecified: Secondary | ICD-10-CM | POA: Diagnosis present

## 2015-02-18 DIAGNOSIS — Z720 Tobacco use: Secondary | ICD-10-CM | POA: Diagnosis present

## 2015-02-18 HISTORY — DX: Tobacco use: Z72.0

## 2015-02-18 HISTORY — DX: Other symptoms and signs involving cognitive functions and awareness: R41.89

## 2015-02-18 HISTORY — DX: Other specified forms of tremor: G25.2

## 2015-02-18 HISTORY — DX: Alcohol dependence, uncomplicated: F10.20

## 2015-02-18 HISTORY — DX: Unspecified intracranial injury with loss of consciousness status unknown, initial encounter: S06.9XAA

## 2015-02-18 HISTORY — DX: Depression, unspecified: F32.A

## 2015-02-18 HISTORY — DX: Major depressive disorder, single episode, unspecified: F32.9

## 2015-02-18 HISTORY — DX: Unspecified intracranial injury with loss of consciousness of unspecified duration, initial encounter: S06.9X9A

## 2015-02-18 LAB — BASIC METABOLIC PANEL
ANION GAP: 7 (ref 5–15)
ANION GAP: 7 (ref 5–15)
BUN: 9 mg/dL (ref 6–20)
BUN: 10 mg/dL (ref 6–20)
CALCIUM: 8 mg/dL — AB (ref 8.9–10.3)
CALCIUM: 7.9 mg/dL — AB (ref 8.9–10.3)
CO2: 24 mmol/L (ref 22–32)
CO2: 25 mmol/L (ref 22–32)
CREATININE: 0.69 mg/dL (ref 0.61–1.24)
Chloride: 112 mmol/L — ABNORMAL HIGH (ref 101–111)
Chloride: 106 mmol/L (ref 101–111)
Creatinine, Ser: 0.73 mg/dL (ref 0.61–1.24)
GFR calc Af Amer: >60 mL/min (ref >60)
GFR calc Af Amer: >60 mL/min (ref >60)
GFR calc non Af Amer: >60 mL/min (ref >60)
GLUCOSE: 87 mg/dL (ref 65–99)
GLUCOSE: 90 mg/dL (ref 65–99)
POTASSIUM: 3 mmol/L — AB (ref 3.5–5.1)
Potassium: 3.3 mmol/L — ABNORMAL LOW (ref 3.5–5.1)
Sodium: 143 mmol/L (ref 135–145)
Sodium: 138 mmol/L (ref 135–145)

## 2015-02-18 LAB — CBC
HEMATOCRIT: 25.1 % — AB (ref 40.0–52.0)
Hemoglobin: 8.5 g/dL — ABNORMAL LOW (ref 13.0–18.0)
MCH: 30.7 pg (ref 26.0–34.0)
MCHC: 33.6 g/dL (ref 32.0–36.0)
MCV: 91.1 fL (ref 80.0–100.0)
Platelets: 674 10*3/uL — ABNORMAL HIGH (ref 150–440)
RBC: 2.76 MIL/uL — ABNORMAL LOW (ref 4.40–5.90)
RDW: 16.6 % — AB (ref 11.5–14.5)
WBC: 10.8 10*3/uL — AB (ref 3.8–10.6)

## 2015-02-18 LAB — CULTURE, BLOOD (ROUTINE X 2)
CULTURE: NO GROWTH
Culture: NO GROWTH

## 2015-02-18 LAB — MAGNESIUM
MAGNESIUM: 1.8 mg/dL (ref 1.7–2.4)
Magnesium: 2 mg/dL (ref 1.7–2.4)

## 2015-02-18 MED ORDER — SODIUM CHLORIDE 0.9 % IV SOLN
INTRAVENOUS | Status: DC
  Administered 2015-02-18: 18:00:00 via INTRAVENOUS

## 2015-02-18 MED ORDER — ALBUTEROL SULFATE (2.5 MG/3ML) 0.083% IN NEBU: 2.5000 mg | INHALATION_SOLUTION | RESPIRATORY_TRACT | Status: DC | PRN

## 2015-02-18 MED ORDER — ACETAMINOPHEN 650 MG RE SUPP
650.0000 mg | Freq: Four times a day (QID) | RECTAL | Status: DC | PRN
Start: 2015-02-18 — End: 2015-02-20

## 2015-02-18 MED ORDER — CITALOPRAM HYDROBROMIDE 20 MG PO TABS: 20.0000 mg | ORAL_TABLET | ORAL | Status: DC

## 2015-02-18 MED ORDER — FOLIC ACID 1 MG PO TABS
1.0000 mg | ORAL_TABLET | Freq: Every day | ORAL | Status: DC
  Administered 2015-02-18 – 2015-02-25 (×8): 1 mg via ORAL
  Filled 2015-02-18 (×8): qty 1

## 2015-02-18 MED ORDER — VORICONAZOLE 200 MG IV SOLR
4.0000 mg/kg | Freq: Two times a day (BID) | INTRAVENOUS | Status: DC
  Administered 2015-02-18 – 2015-02-20 (×4): 250 mg via INTRAVENOUS
  Filled 2015-02-18 (×9): qty 250

## 2015-02-18 MED ORDER — ENSURE ENLIVE PO LIQD
237.0000 mL | Freq: Two times a day (BID) | ORAL | Status: DC
  Administered 2015-02-19 – 2015-02-25 (×14): 237 mL via ORAL

## 2015-02-18 MED ORDER — MIRTAZAPINE 15 MG PO TABS
15.0000 mg | ORAL_TABLET | Freq: Every day | ORAL | Status: DC
  Administered 2015-02-18 – 2015-02-24 (×7): 15 mg via ORAL
  Filled 2015-02-18 (×7): qty 1

## 2015-02-18 MED ORDER — ADULT MULTIVITAMIN W/MINERALS CH
1.0000 | ORAL_TABLET | Freq: Every day | ORAL | Status: DC
  Administered 2015-02-18 – 2015-02-25 (×8): 1 via ORAL
  Filled 2015-02-18 (×8): qty 1

## 2015-02-18 MED ORDER — AMIODARONE HCL 200 MG PO TABS
200.0000 mg | ORAL_TABLET | Freq: Every day | ORAL | Status: DC
  Administered 2015-02-19 – 2015-02-25 (×7): 200 mg via ORAL
  Filled 2015-02-18 (×7): qty 1

## 2015-02-18 MED ORDER — VITAMIN B-1 100 MG PO TABS
100.0000 mg | ORAL_TABLET | Freq: Every day | ORAL | Status: DC
  Administered 2015-02-18 – 2015-02-25 (×8): 100 mg via ORAL
  Filled 2015-02-18 (×8): qty 1

## 2015-02-18 MED ORDER — ACETAMINOPHEN 325 MG PO TABS
650.0000 mg | ORAL_TABLET | Freq: Four times a day (QID) | ORAL | Status: DC | PRN
  Administered 2015-02-19 (×3): 650 mg via ORAL
  Filled 2015-02-18 (×3): qty 2

## 2015-02-18 MED ORDER — PIPERACILLIN-TAZOBACTAM 3.375 G IVPB
3.3750 g | Freq: Three times a day (TID) | INTRAVENOUS | Status: DC
  Administered 2015-02-18 – 2015-02-19 (×3): 3.375 g via INTRAVENOUS
  Filled 2015-02-18 (×5): qty 50

## 2015-02-18 MED ORDER — SODIUM CHLORIDE 0.9 % IJ SOLN
3.0000 mL | Freq: Two times a day (BID) | INTRAMUSCULAR | Status: DC
  Administered 2015-02-18 – 2015-02-25 (×12): 3 mL via INTRAVENOUS

## 2015-02-18 MED ORDER — GUAIFENESIN ER 600 MG PO TB12
1200.0000 mg | ORAL_TABLET | Freq: Two times a day (BID) | ORAL | Status: DC
  Administered 2015-02-18 – 2015-02-25 (×14): 1200 mg via ORAL
  Filled 2015-02-18 (×14): qty 2

## 2015-02-18 MED ORDER — POTASSIUM CHLORIDE CRYS ER 20 MEQ PO TBCR: 40.0000 meq | EXTENDED_RELEASE_TABLET | ORAL | Status: DC

## 2015-02-18 MED ORDER — GUAIFENESIN-DM 100-10 MG/5ML PO SYRP
5.0000 mL | ORAL_SOLUTION | ORAL | Status: DC | PRN
  Administered 2015-02-19: 5 mL via ORAL
  Filled 2015-02-18: qty 5

## 2015-02-18 MED ORDER — VANCOMYCIN HCL IN DEXTROSE 1-5 GM/200ML-% IV SOLN
1000.0000 mg | Freq: Two times a day (BID) | INTRAVENOUS | Status: DC
  Administered 2015-02-18 – 2015-02-19 (×2): 1000 mg via INTRAVENOUS
  Filled 2015-02-18 (×4): qty 200

## 2015-02-18 MED ORDER — PRIMIDONE 50 MG PO TABS
200.0000 mg | ORAL_TABLET | Freq: Every day | ORAL | Status: DC
  Administered 2015-02-18 – 2015-02-24 (×7): 200 mg via ORAL
  Filled 2015-02-18 (×8): qty 4

## 2015-02-18 MED ORDER — TOPIRAMATE 25 MG PO TABS
25.0000 mg | ORAL_TABLET | Freq: Two times a day (BID) | ORAL | Status: DC
  Administered 2015-02-19 – 2015-02-25 (×14): 25 mg via ORAL
  Filled 2015-02-18 (×18): qty 1

## 2015-02-18 NOTE — Progress Notes (Signed)
Nutrition Follow-up  DOCUMENTATION CODES:   Severe malnutrition in context of chronic illness  INTERVENTION:  Meals and snacks: Cater to pt preferences Medical Nutrition Supplement Therapy: continue ensure BID for added nutrition   NUTRITION DIAGNOSIS:   Inadequate oral intake related to poor appetite as evidenced by per patient/family report, severe depletion of body fat, severe depletion of muscle mass.    GOAL:   Patient will meet greater than or equal to 90% of their needs  Meeting nutritional needs  MONITOR:    (Energy intake, Electrolyte and renal profile)  REASON FOR ASSESSMENT:   Malnutrition Screening Tool    ASSESSMENT:     Current Nutrition: 60-100% of meals.  Visited pt during lunch today and cleaning the plate during visit. Reports drinking ensure   Gastrointestinal Profile: Last BM: 10/6   Medications: remeron, NS at 127ml/hr  Electrolyte/Renal Profile and Glucose Profile:   Recent Labs Lab 02/13/15 0945 02/14/15 0645 02/16/15 0506 02/18/15 0559  NA 140  --   --  143  K 3.5  --   --  3.0*  CL 110  --   --  112*  CO2 26  --   --  24  BUN 12  --   --  9  CREATININE 0.82 0.72 0.67 0.73  CALCIUM 8.2*  --   --  8.0*  GLUCOSE 114*  --   --  87   Protein Profile:  Recent Labs Lab 02/13/15 0945  ALBUMIN 2.0*     Weight Trend since Admission: Filed Weights   02/13/15 1552  Weight: 131 lb 3.2 oz (59.512 kg)      Diet Order:  Diet regular Room service appropriate?: Yes; Fluid consistency:: Thin Diet - low sodium heart healthy  Skin:   reviewed   Height:   Ht Readings from Last 1 Encounters:  02/13/15 5\' 9"  (1.753 m)    Weight:   Wt Readings from Last 1 Encounters:  02/13/15 131 lb 3.2 oz (59.512 kg)       BMI:  Body mass index is 19.37 kg/(m^2).  Estimated Nutritional Needs:   Kcal:  BEE 1545 kcals (IF 1.1-1.3, Af 1.3) 2209-2611 kcals/d Using IBW of 73kg  Protein:  (1.1-1.3 g/kg) 80-95 g/d  Fluid:   (30-81ml/kg) 2025-4270 ml/d  EDUCATION NEEDS:   No education needs identified at this time  LOW Care Level  Khrystyne Arpin B. Zenia Resides, [place], [place] (pager)

## 2015-02-18 NOTE — Progress Notes (Signed)
ANTIBIOTIC CONSULT NOTE - INITIAL  Pharmacy Consult for vancomycin, zosyn, and voriconazole Indication: cavitary PNA with positive mold culture  No Known Allergies  Patient Measurements: Height: 5\' 9"  (175.3 cm) Weight: 135 lb 2.3 oz (61.3 kg) IBW/kg (Calculated) : 70.7  Vital Signs: Temp: 97.4 F (36.3 C) (10/07 1644) Temp Source: Oral (10/07 1644) BP: 105/62 mmHg (10/07 1644) Pulse Rate: 76 (10/07 1644) Intake/Output from previous day:   Intake/Output from this shift:    Labs:  Recent Labs  02/16/15 0506 02/17/15 0431 02/18/15 0559  WBC 14.7* 15.2* 10.8*  HGB 6.7* 8.7* 8.5*  PLT 720* 729* 674*  CREATININE 0.67  --  0.73   Estimated Creatinine Clearance: 88.3 mL/min (by C-G formula based on Cr of 0.73).  Recent Labs  02/17/15 0431  VANCOTROUGH 10   Assessment: 59 yo male presenting from [facility] with cavitary PNA for evaluation by CT surgery. Originally d/c on 9/29 with voriconazole but did not fill and returned 10/2  PMH: hx of TBI, Seizure disorder  ID: Cavitary PNA (at [facility]: HIV neg, Sputum + for aspergillus, Quantiferon Indet., CT Chest fungal ball). WBC 10.8, AF  Vancomycin 10/2([facility])>> Zosyn 10/2([facility])>> Voriconazole 10/2([facility])>>  VT 10/3 (1g q12h) = 16 mcg/mL VT 10/6 (1g q12h) = 10 mcg/mL (1 dose missed due to blood tx, not a SS level)  10/6 Resp. NGTD  Renal: SCr 0.73  Goal of Therapy:  Vancomycin trough level 15-20 mcg/ml  Plan:  Vancomycin 1 g q12 (LD [facility] 0600 10/7) Zosyn 3.375 gm IV q8h Voriconazole 4 mg/kg q12h (LD [facility] 1100) VT at ss (schedule a little off due to transfer - consider Sat PM or Sun AM) Monitor culture results, surgery plan, voriconazole levels?  Levester Fresh, PharmD, BCPS Clinical Pharmacist Pager [phone] 02/18/2015 6:57 PM

## 2015-02-18 NOTE — Progress Notes (Signed)
Pt transferred to [facility]; report called to Neil Crouch, RN; pt left unit via [facility]

## 2015-02-18 NOTE — Progress Notes (Signed)
   02/18/15 1151  Chest Physiotherapy Tx  CPT Delivery Source (patient able to cough up secrecions on command.)  Patient has ability to effectively cough up secretions.  Strong productive cough on demand.  Secretions are green and frothy

## 2015-02-18 NOTE — Progress Notes (Signed)
PARENTERAL NUTRITION CONSULT NOTE  Pharmacy Consult for electrolyte supplementation Indication: hypokalemia  No Known Allergies  Patient Measurements: Height: 5\' 9"  (175.3 cm) Weight: 131 lb 3.2 oz (59.512 kg) IBW/kg (Calculated) : 70.7  Vital Signs: Temp: 98.3 F (36.8 C) (10/07 0439) Temp Source: Oral (10/07 0439) BP: 113/69 mmHg (10/07 0439) Pulse Rate: 74 (10/07 0439)  Labs:  Recent Labs  02/16/15 0506 02/17/15 0431 02/18/15 0559  WBC 14.7* 15.2* 10.8*  HGB 6.7* 8.7* 8.5*  HCT 21.4* 26.5* 25.1*  PLT 720* 729* 674*    Recent Labs  02/16/15 0506 02/18/15 0559  NA  --  143  K  --  3.0*  CL  --  112*  CO2  --  24  GLUCOSE  --  87  BUN  --  9  CREATININE 0.67 0.73  CALCIUM  --  8.0*    Estimated Creatinine Clearance: 85.7 mL/min (by C-G formula based on Cr of 0.73).   No results for input(s): GLUCAP in the last 72 hours. Assessment: Pharmacy consulted to monitor and supplement electrolytes in this 59 year old male admitted for treatment of sepsis/aspirgillosis, possible aspiration PNA.  Plan:  Potassium low, will give 74mEq oral x 2 and check magnesium level.  Will recheck electrolytes tomorrow with AM labs.   Rexene Edison, PharmD Clinical Pharmacist  02/18/2015 2:42 PM

## 2015-02-18 NOTE — Progress Notes (Signed)
   02/18/15 0827  Chest Physiotherapy Tx  CPT Delivery Source (Not performed.  Patient in pain.  )  Patient in pain.  Rn notified.  This patient is effectively clearing secretions on his own.  BS ronchi.

## 2015-02-18 NOTE — Progress Notes (Signed)
ANTIBIOTIC CONSULT NOTE -Follow up  Pharmacy Consult for Vancomycin/Zosyn/Voriconazole Indication: rule out sepsis  No Known Allergies  Patient Measurements: Height: 5\' 9"  (175.3 cm) Weight: 131 lb 3.2 oz (59.512 kg) IBW/kg (Calculated) : 70.7  Vital Signs: Temp: 98.3 F (36.8 C) (10/07 0439) Temp Source: Oral (10/07 0439) BP: 113/69 mmHg (10/07 0439) Pulse Rate: 74 (10/07 0439)  Labs:  Recent Labs  02/16/15 0506 02/17/15 0431 02/18/15 0559  WBC 14.7* 15.2* 10.8*  HGB 6.7* 8.7* 8.5*  PLT 720* 729* 674*  CREATININE 0.67  --  0.73   Estimated Creatinine Clearance: 85.7 mL/min (by C-G formula based on Cr of 0.73).  Recent Labs  02/17/15 0431  [facility] 10     Microbiology: Recent Results (from the past 720 hour(s))  Blood culture (routine x 2)     Status: None   Collection Time: 02/01/15  7:41 AM  Result Value Ref Range Status   Specimen Description BLOOD LEFT HAND  Final   Special Requests   Final    BOTTLES DRAWN AEROBIC AND ANAEROBIC 2 CC AERO 1 CC ANAERO   Culture NO GROWTH 5 DAYS  Final   Report Status 02/06/2015 FINAL  Final  Culture, expectorated sputum-assessment     Status: None   Collection Time: 02/01/15  7:42 AM  Result Value Ref Range Status   Specimen Description EXPECTORATED SPUTUM  Final   Special Requests Immunocompromised  Final   Sputum evaluation THIS SPECIMEN IS ACCEPTABLE FOR SPUTUM CULTURE  Final   Report Status 02/01/2015 FINAL  Final  Culture, respiratory (NON-Expectorated)     Status: None (Preliminary result)   Collection Time: 02/01/15  7:42 AM  Result Value Ref Range Status   Specimen Description EXPECTORATED SPUTUM  Final   Special Requests Immunocompromised Reflexed from [id number]  Final   Gram Stain   Final    EXCELLENT SPECIMEN - 90-100% WBCS MANY WBC SEEN FEW GRAM NEGATIVE RODS MANY GRAM POSITIVE COCCI IN CHAINS    Culture MOLD SENT TO STATE FOR IDENTIFICATION  Final   Report Status PENDING  Incomplete  Blood culture  (routine x 2)     Status: None   Collection Time: 02/01/15  7:43 AM  Result Value Ref Range Status   Specimen Description BLOOD RIGHT ARM  Final   Special Requests   Final    BOTTLES DRAWN AEROBIC AND ANAEROBIC 1 CC ANAERO 4 CC AERO   Culture NO GROWTH 5 DAYS  Final   Report Status 02/06/2015 FINAL  Final  Urine culture     Status: None   Collection Time: 02/01/15  8:35 AM  Result Value Ref Range Status   Specimen Description URINE, CLEAN CATCH  Final   Special Requests NONE  Final   Culture NO GROWTH 1 DAY  Final   Report Status 02/02/2015 FINAL  Final  MRSA PCR Screening     Status: None   Collection Time: 02/01/15 12:50 PM  Result Value Ref Range Status   MRSA by PCR NEGATIVE NEGATIVE Final    Comment:        The GeneXpert MRSA Assay (FDA approved for NASAL specimens only), is one component of a comprehensive MRSA colonization surveillance program. It is not intended to diagnose MRSA infection nor to guide or monitor treatment for MRSA infections.   Culture, expectorated sputum-assessment     Status: None   Collection Time: 02/02/15 12:10 PM  Result Value Ref Range Status   Specimen Description INDUCED SPUTUM  Final   Special  Requests NONE  Final   Sputum evaluation THIS SPECIMEN IS ACCEPTABLE FOR SPUTUM CULTURE  Final   Report Status 02/02/2015 FINAL  Final  Culture, respiratory (NON-Expectorated)     Status: None (Preliminary result)   Collection Time: 02/02/15 12:10 PM  Result Value Ref Range Status   Specimen Description INDUCED SPUTUM  Final   Special Requests NONE Reflexed from [id number]  Final   Gram Stain   Final    EXCELLENT SPECIMEN - 90-100% WBCS MANY WBC SEEN RARE FUNGAL ELEMENT SEEN    Culture   Final    MOLD PREVIOUS MOLD SENT TO THE STATE LABORATORY FOR IDENTIFICATION LIGHT GROWTH CANDIDA SPECIES    Report Status PENDING  Incomplete  C difficile quick scan w PCR reflex     Status: None   Collection Time: 02/03/15 10:33 AM  Result Value Ref Range  Status   C Diff antigen NEGATIVE NEGATIVE Final   C Diff toxin NEGATIVE NEGATIVE Final   C Diff interpretation Negative for C. difficile  Final  Blood Culture (routine x 2)     Status: None   Collection Time: 02/13/15 10:14 AM  Result Value Ref Range Status   Specimen Description BLOOD RIGHT ARM  Final   Special Requests BOTTLES DRAWN AEROBIC AND ANAEROBIC  [place]  Final   Culture NO GROWTH 5 DAYS  Final   Report Status 02/18/2015 FINAL  Final  Blood Culture (routine x 2)     Status: None   Collection Time: 02/13/15 10:15 AM  Result Value Ref Range Status   Specimen Description BLOOD RIGHT ASSIST CONTROL  Final   Special Requests BOTTLES DRAWN AEROBIC AND ANAEROBIC  4CC  Final   Culture NO GROWTH 5 DAYS  Final   Report Status 02/18/2015 FINAL  Final  Urine culture     Status: None   Collection Time: 02/13/15  1:05 PM  Result Value Ref Range Status   Specimen Description URINE, CLEAN CATCH  Final   Special Requests NONE  Final   Culture 60,000 COLONIES/ml KLEBSIELLA PNEUMONIAE  Final   Report Status 02/15/2015 FINAL  Final   Organism ID, Bacteria KLEBSIELLA PNEUMONIAE  Final      Susceptibility   Klebsiella pneumoniae - MIC*    AMPICILLIN >=32 RESISTANT Resistant     CEFAZOLIN <=4 SENSITIVE Sensitive     CEFTRIAXONE <=1 SENSITIVE Sensitive     CIPROFLOXACIN <=0.25 SENSITIVE Sensitive     GENTAMICIN <=1 SENSITIVE Sensitive     IMIPENEM <=0.25 SENSITIVE Sensitive     NITROFURANTOIN 128 RESISTANT Resistant     TRIMETH/SULFA <=20 SENSITIVE Sensitive     PIP/TAZO Value in next row Sensitive      SENSITIVE<=4    * 60,000 COLONIES/ml KLEBSIELLA PNEUMONIAE  Culture, expectorated sputum-assessment     Status: None   Collection Time: 02/17/15  4:54 PM  Result Value Ref Range Status   Specimen Description EXPECTORATED SPUTUM  Final   Special Requests Immunocompromised  Final   Sputum evaluation THIS SPECIMEN IS ACCEPTABLE FOR SPUTUM CULTURE  Final   Report Status 02/17/2015 FINAL   Final  Culture, respiratory (NON-Expectorated)     Status: None (Preliminary result)   Collection Time: 02/17/15  4:54 PM  Result Value Ref Range Status   Specimen Description EXPECTORATED SPUTUM  Final   Special Requests Immunocompromised Reflexed from [id number]  Final   Gram Stain   Final    MODERATE WBC SEEN FEW GRAM POSITIVE COCCI EXCELLENT SPECIMEN - 90-100% WBCS  Culture HOLDING FOR POSSIBLE PATHOGEN  Final   Report Status PENDING  Incomplete    Medical History: Past Medical History  Diagnosis Date  . Seizures ([place])     Assessment: Pharmacy consulted to dose vancomycin, zosyn, and voriconazole for this 59 yo male brought to ED via EMS for weakness and confusion. 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  On day 6 of vanc/zosyn/iv voriconazole. Dr. Manuella Ghazi would like to continue broad spectrum coverage and IV voriconazole. Patient to be evaluated by ID.  Ke: 0.073 T1/2: 9.5 Vd: 41.3  Goal of Therapy:  Vancomycin trough level 15-20 mcg/ml  Plan:   Will continue patient on Zosyn 3.375 q8 hours  Will continue voriconazole 4 mg/kg IV q12.  Current orders for vancomycin 1gm IV Q12H. Will check trough today.   Rexene Edison, PharmD Clinical Pharmacist  02/18/2015 3:22 PM

## 2015-02-18 NOTE — Progress Notes (Addendum)
[facility] NAME: Jonathan [name]    MR#:  [id number]  DATE OF BIRTH:  06/09/1955  SUBJECTIVE:  CHIEF COMPLAINT:   Chief Complaint  Patient presents with  . Cough  about same, can't lie flat. No new complaints, waiting for transfer at tertiary care REVIEW OF SYSTEMS:  Review of Systems  Constitutional: Negative for fever, weight loss, malaise/fatigue and diaphoresis.  HENT: Negative for ear discharge, ear pain, hearing loss, nosebleeds, sore throat and tinnitus.   Eyes: Negative for blurred vision and pain.  Respiratory: Positive for shortness of breath. Negative for cough, hemoptysis and wheezing.   Cardiovascular: Negative for chest pain, palpitations, orthopnea and leg swelling.  Gastrointestinal: Negative for heartburn, nausea, vomiting, abdominal pain, diarrhea, constipation and blood in stool.  Genitourinary: Negative for dysuria, urgency and frequency.  Musculoskeletal: Negative for myalgias and back pain.  Skin: Negative for itching and rash.  Neurological: Negative for dizziness, tingling, tremors, focal weakness, seizures, weakness and headaches.  Psychiatric/Behavioral: Negative for depression. The patient is not nervous/anxious.    DRUG ALLERGIES:  No Known Allergies VITALS:  Blood pressure 113/69, pulse 74, temperature 98.3 F (36.8 C), temperature source Oral, resp. rate 18, height 5\' 9"  (1.753 m), weight 59.512 kg (131 lb 3.2 oz), SpO2 98 %. PHYSICAL EXAMINATION:  Physical Exam  Constitutional: He is oriented to person, place, and time and well-developed, well-nourished, and in no distress.  HENT:  Head: Normocephalic and atraumatic.  Eyes: Conjunctivae and EOM are normal. Pupils are equal, round, and reactive to light.  Neck: Normal range of motion. Neck supple. No tracheal deviation present. No thyromegaly present.  Cardiovascular: Normal rate, regular rhythm and normal heart sounds.   Pulmonary/Chest:  Effort normal and breath sounds normal. No respiratory distress. He has no wheezes. He exhibits no tenderness.  Abdominal: Soft. Bowel sounds are normal. He exhibits no distension. There is no tenderness.  Musculoskeletal: Normal range of motion.  Neurological: He is alert and oriented to person, place, and time. No cranial nerve deficit.  Skin: Skin is warm and dry. No rash noted.  Psychiatric: Mood and affect normal.   LABORATORY PANEL:   CBC  Recent Labs Lab 02/18/15 0559  WBC 10.8*  HGB 8.5*  HCT 25.1*  PLT 674*   ------------------------------------------------------------------------------------------------------------------  Chemistries   Recent Labs Lab 02/13/15 0945  02/18/15 0559  NA 140  --  143  K 3.5  --  3.0*  CL 110  --  112*  CO2 26  --  24  GLUCOSE 114*  --  87  BUN 12  --  9  CREATININE 0.82  < > 0.73  CALCIUM 8.2*  --  8.0*  AST 26  --   --   ALT 25  --   --   ALKPHOS 70  --   --   BILITOT 0.4  --   --   < > = values in this interval not displayed. ------------------------------------------------------------------------------------------------------------------  ASSESSMENT AND PLAN:   Active Problems:   Septic shock (HCC)   Dementia  1. Shock: Hypovolemic vs septic. pneumonia  continue IV Voriconazole, Vanco & Zosyn.   hydrate with IVF aggressively. Suspect with poor PO intake and no access to meds this is a combination of both.   2. LUL Cavitory Lung Lesion: Fungal cultures positive. is being treated for aspergillosis.  Was evaluated by CT surgery and was reccommended to follow up at [facility]/Tertiary care for further evaluation for possible  thoracotomy and resection. Not sure pt would tolerate this given his current condition.  Appreciated Pulmonary consult again.  For repeated infection called ID consult and appreciated his call to [facility] - Pt is accepted and will be transferred when bed is available. I've also placed transfer request call at [facility]  - waiting to hear back.  3. Severe Malnutrition: BMI 15. Not taking PO well.     Will need nutritional supplementation.  4. Bilateral Non Occlusive Upper Ext DVT: Cannot tolerate anticoagulation because of bleeding risk from lung lesion.  5. Atrial Fib: On amiodarone for rate control. No anticoagulation sec to above.  6. Anemia of Chronic Dz: hb dropped < 7. required One unit of packed unit of 1 PRBC transfusion  7. Seizure Disorder: Controlled on meds. Followed by neurology at [facility]  8. Hypokalemia: Will replete and recheck.  Check magnesium   Waiting for [facility]/ [facility]  All the records are reviewed and case discussed with Care Management/Social Worker. Management plans discussed with the patient, Dr Ola Spurr and they are in agreement.  CODE STATUS: full.  TOTAL TIME TAKING CARE OF THIS PATIENT: 30 minutes.   POSSIBLE D/C IN 2-3 DAYS, DEPENDING ON CLINICAL CONDITION.   [facility], Dagan Heinz M.D on 02/18/2015   Between 7am to 6pm - Pager - [phone]  After 6pm go to [email or web address] - password [email or web address] [facility]  [facility]  Office  [phone]  CC: Primary care physician; No PCP Per Patient  Note: This dictation was prepared with Dragon dictation along with smaller phrase technology. Any transcriptional errors that result from this process are unintentional.

## 2015-02-18 NOTE — H&P (Signed)
History and Physical  Jonathan [name]:[id number] DOB: 08/17/1955 DOA: 02/18/2015  Referring physician: Dr. Max Sane, Hospitalist at [facility] PCP: No PCP Per Patient  Outpatient Specialists:  1. Neurology: At [facility].  Chief Complaint: Productive cough, dyspnea. Transferred from [facility] for evaluation by CT surgery.  HPI: Jonathan [name] is a 59 y.o. male , separated, apparently lives with a friend intermittently, depends on elderly parents for financial and other support, history of traumatic brain injury, cognitive impairment, possible depression, tobacco and alcohol abuse, seizure disorder-unclear if related to his TBI or alcohol withdrawal, initially presented to the [facility] with complaints of profound weight loss, chronic productive cough and hemoptysis. No history of TB contacts, present, 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 He was treated with IV vancomycin, Zosyn and discharged 9/29 on voriconazole. However he never picked up voriconazole and was readmitted to [facility] on 10/2 with septic shock which has since resolved. He was evaluated by surgery (Dr. Nestor Lewandowsky) on 02/08/15 who recommended that he will likely require thoracotomy with left upper lobe debridement and muscle flap transposition into the chest cavity for long-term management. Because of the intensity of service, he recommended transfer to tertiary care center for management. While at [facility], patient was evaluated by infectious disease, psychiatry, critical care medicine and psychiatry service. Today he was transferred to [facility] for evaluation by cardiothoracic  surgery. Patient states that he continues to have cough productive of yellow sputum (seen in cup) without hemoptysis and dyspnea with minimal activity. He denies chest pain. He states that he generally does not feel well but has no specific complaints. Due to his cognitive impairment, he is a poor historian. He does give history of chronic tremors. He states that he has not had seizures in a "long time".   Review of Systems: All systems reviewed and apart from history of presenting illness, are negative.  Past Medical History  Diagnosis Date  . Seizures ([place])   . Coarse tremors   . Traumatic brain injury ([place])   . Cognitive impairment   . Depression   . Alcohol dependence ([place])   . Tobacco abuse    No past surgical history on file. Social History:  reports that he has been smoking.  He does not have any smokeless tobacco history on file. He reports that he drinks alcohol. He reports that he does not use illicit drugs. Rest as per history of presenting illness  No Known Allergies  No family history on file. patient states that his parents live close by and support him but is unable to give any significant family history.  Prior to Admission medications   Medication Sig Start Date End Date Taking? Authorizing Provider  amiodarone (PACERONE) 200 MG tablet Take 1 tablet (200 mg total) by mouth daily. 02/10/15   Demetrios Loll, MD  citalopram (CELEXA) 20 MG tablet Take 20 mg by mouth every morning.    Historical Provider, MD  guaiFENesin (MUCINEX) 600 MG 12 hr tablet Take 1 tablet (600 mg total) by mouth 2 (two) times daily as needed. 02/10/15   Demetrios Loll, MD  primidone (MYSOLINE) 50 MG tablet Take 200 mg by mouth at bedtime.    Historical Provider, MD  topiramate (  TOPAMAX) 25 MG tablet Take 25 mg by mouth 2 (two) times daily. 1 tablet in the morning and the second tablet 6 hours after the first    Historical Provider, MD  voriconazole (VFEND) 200 MG tablet Take 1 tablet (200 mg total) by mouth  every 12 (twelve) hours. 02/10/15   Demetrios Loll, MD   Physical Exam: Filed Vitals:   02/18/15 1644  BP: 105/62  Pulse: 76  Temp: 97.4 F (36.3 C)  TempSrc: Oral  Resp: 20  Height: 5\' 9"  (1.753 m)  Weight: 61.3 kg (135 lb 2.3 oz)  SpO2: 97%     General exam: Moderately built and poorly nourished middle-aged male patient, lying comfortably propped up in bed in no obvious distress.  Head, eyes and ENT: Nontraumatic and normocephalic. Pupils equally reacting to light and accommodation. Oral mucosa moist. Missing multiple teeth. No overt dental caries.  Neck: Supple. No JVD, carotid bruit or thyromegaly.  Lymphatics: No lymphadenopathy.  Respiratory system: Harsh breath sounds, particularly in the bases with scattered crackles in the left base. No increased work of breathing.  Cardiovascular system: S1 and S2 heard, RRR. No JVD, murmurs, gallops, clicks. Trace bilateral ankle edema.  Gastrointestinal system: Abdomen is nondistended, soft and nontender. Normal bowel sounds heard. No organomegaly or masses appreciated.  Central nervous system: Alert and oriented 2. No focal neurological deficits.  Extremities: Symmetric 5 x 5 power. Peripheral pulses symmetrically felt. Edematous right upper extremity but no acute findings.  Skin: No rashes or acute findings.  Musculoskeletal system: Negative exam.  Psychiatry: Pleasant and cooperative.   Labs on Admission:  Basic Metabolic Panel:  Recent Labs Lab 02/13/15 0945 02/14/15 0645 02/16/15 0506 02/18/15 0559  NA 140  --   --  143  K 3.5  --   --  3.0*  CL 110  --   --  112*  CO2 26  --   --  24  GLUCOSE 114*  --   --  87  BUN 12  --   --  9  CREATININE 0.82 0.72 0.67 0.73  CALCIUM 8.2*  --   --  8.0*  MG  --   --   --  1.8   Liver Function Tests:  Recent Labs Lab 02/13/15 0945  AST 26  ALT 25  ALKPHOS 70  BILITOT 0.4  PROT 6.5  ALBUMIN 2.0*   No results for input(s): LIPASE, AMYLASE in the last 168 hours. No  results for input(s): AMMONIA in the last 168 hours. CBC:  Recent Labs Lab 02/13/15 0945 02/15/15 0445 02/16/15 0506 02/17/15 0431 02/18/15 0559  WBC 12.9* 10.5 14.7* 15.2* 10.8*  NEUTROABS 11.0*  --   --   --   --   HGB 7.6* 6.6* 6.7* 8.7* 8.5*  HCT 23.3* 19.8* 21.4* 26.5* 25.1*  MCV 91.7 91.0 93.7 89.5 91.1  PLT 756* 676* 720* 729* 674*   Cardiac Enzymes:  Recent Labs Lab 02/13/15 0945  TROPONINI <0.03    BNP (last 3 results) No results for input(s): PROBNP in the last 8760 hours. CBG: No results for input(s): GLUCAP in the last 168 hours.  Radiological Exams on Admission: No results found.  EKG: Independently reviewed. [place]ines/20/16: Poor quality with baseline artifact but showed sinus tachycardia in the 120s, normal axis without acute changes and QTC of 436 ms.  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 Active Problems:   Benign essential tremor   Protein-calorie malnutrition, severe (HCC)   Dementia  Tobacco abuse   Alcohol dependence ([place])   History of traumatic brain injury-with cognitive impairment.   Seizure disorder ([place])   Hypokalemia   Anemia of chronic disease   Thrombocytosis (HCC)   Failure to thrive in adult   Pulmonary aspergilloma (HCC)   Chronic Cavitary Aspergillosis/left upper lobe - Patient has been evaluated by infectious disease, CCM and surgery at [facility] - For now continue IV vancomycin, Zosyn and voriconazole as per ID recommendations. Consider consulting [facility] ID in a.m. for further recommendations. - As per surgical recommendation made at [facility], consulted cardiothoracic surgery for further recommendations regarding thoracotomy and resection.  Atrial fibrillation - On amiodarone. Not anticoagulation candidate secondary to hemoptysis from lung lesion. Monitor on telemetry. Will repeat EKG.  Anemia of chronic disease -  Status post PRBC transfusion 1 unit. Hemoglobin stable over the last 24 hours. Follow CBC in a.m.  Seizure disorder - Unclear if related to his traumatic brain injury or alcohol withdrawal. Continue Topamax. Follows at [facility] neurology.  Hypokalemia - Apparently replaced earlier today at [facility] area did follow BMP. Magnesium normal.  Bilateral nonocclusive upper extremity DVT - Not on anticoagulation secondary to hemoptysis from lung lesion.  Severe protein calorie malnutrition - Continue nutritional supplements and will consult dietitian.  Tobacco abuse - Cessation counseled  Alcohol dependence - Continue multivitamins, folate and thiamine supplements. Likely out of window for withdrawal (has been hospitalized since 01/14/15). However monitor closely.  Chronic tremors - ? Benign essential tremors versus related to alcohol dependence.  History of traumatic brain injury and cognitive impairment  Adult failure to thrive  Depression - Continue Celexa    DVT prophylaxis: SCD's Code Status: Full  Family Communication: None at bedside  Disposition Plan: DC home   Time spent: 70 minutes.  Vernell Leep, MD, FACP, FHM. [facility] Pager [phone]  If 7PM-7AM, please contact night-coverage [email or web address] Password [email or web address] 02/18/2015, 5:59 PM

## 2015-02-19 DIAGNOSIS — R918 Other nonspecific abnormal finding of lung field: Secondary | ICD-10-CM

## 2015-02-19 DIAGNOSIS — J984 Other disorders of lung: Secondary | ICD-10-CM

## 2015-02-19 DIAGNOSIS — F0390 Unspecified dementia without behavioral disturbance: Secondary | ICD-10-CM

## 2015-02-19 LAB — CBC
HCT: 24.9 % — ABNORMAL LOW (ref 39.0–52.0)
Hemoglobin: 8.1 g/dL — ABNORMAL LOW (ref 13.0–17.0)
MCH: 30.2 pg (ref 26.0–34.0)
MCHC: 32.5 g/dL (ref 30.0–36.0)
MCV: 92.9 fL (ref 78.0–100.0)
PLATELETS: 650 10*3/uL — AB (ref 150–400)
RBC: 2.68 MIL/uL — ABNORMAL LOW (ref 4.22–5.81)
RDW: 17 % — AB (ref 11.5–15.5)
WBC: 13 10*3/uL — AB (ref 4.0–10.5)

## 2015-02-19 LAB — BASIC METABOLIC PANEL
Anion gap: 9 (ref 5–15)
BUN: 10 mg/dL (ref 6–20)
CALCIUM: 8.2 mg/dL — AB (ref 8.9–10.3)
CO2: 27 mmol/L (ref 22–32)
CREATININE: 0.73 mg/dL (ref 0.61–1.24)
Chloride: 108 mmol/L (ref 101–111)
GFR calc Af Amer: >60 mL/min (ref >60)
Glucose, Bld: 88 mg/dL (ref 65–99)
Potassium: 3.3 mmol/L — ABNORMAL LOW (ref 3.5–5.1)
SODIUM: 144 mmol/L (ref 135–145)

## 2015-02-19 MED ORDER — CITALOPRAM HYDROBROMIDE 20 MG PO TABS
20.0000 mg | ORAL_TABLET | ORAL | Status: DC
  Administered 2015-02-19 – 2015-02-25 (×7): 20 mg via ORAL
  Filled 2015-02-19 (×7): qty 1

## 2015-02-19 MED ORDER — POTASSIUM CHLORIDE CRYS ER 20 MEQ PO TBCR
40.0000 meq | EXTENDED_RELEASE_TABLET | ORAL | Status: AC
  Administered 2015-02-19 (×2): 40 meq via ORAL
  Filled 2015-02-19 (×2): qty 2

## 2015-02-19 NOTE — Evaluation (Signed)
Occupational Therapy Evaluation Patient Details Name: Jonathan [name] MRN: [id number] DOB: 1955-10-14 Today's Date: 02/19/2015    History of Present Illness Patient is a 59 y.o. male with history of traumatic brain injury, cognitive impairment, possible depression, tobacco and alcohol abuse, seizure disorder-unclear if related to his TBI or alcohol withdrawal, initially presented to the [facility] with complaints of profound weight loss, chronic productive cough and hemoptysis. transferred to [facility] for evaluation by cardiothoracic surgery.   Clinical Impression   Pt admitted with above. Pt independent with ADLs, PTA. Feel pt will benefit from acute OT to increase independence and strength prior to d/c. Recommending SNF at this time, unless someone can provide 24/7 assist/supervision at home.    Follow Up Recommendations  SNF;Supervision/Assistance - 24 hour    Equipment Recommendations  3 in 1 bedside comode    Recommendations for Other Services       Precautions / Restrictions Precautions Precautions: Fall Precaution Comments: decreased cognition Restrictions Weight Bearing Restrictions: No      Mobility Bed Mobility Overal bed mobility: Needs Assistance Bed Mobility: Supine to Sit;Sit to Supine     Supine to sit: Supervision Sit to supine: Supervision      Transfers Overall transfer level: Needs assistance Equipment used: Rolling walker (2 wheeled) Transfers: Sit to/from [facility] Sit to Stand: Min guard Stand pivot transfers: Min guard       General transfer comment: cues for technique    Balance    Used RW for stand pivot and upon standing. No LOB in session.                                        ADL Overall ADL's : Needs assistance/impaired     Grooming: Set up;Sitting;Wash/dry face       Lower Body Bathing: Min guard;Sit to/from stand       Lower Body Dressing: Min  guard;Sit to/from stand   Toilet Transfer: Min guard;Stand-pivot;BSC;RW   Toileting- Water quality scientist and Hygiene: Min guard;Sit to/from stand       Functional mobility during ADLs: Min guard;Rolling walker (stand pivot)       Vision     Perception     Praxis      Pertinent Vitals/Pain Pain Assessment: 0-10 Pain Score: 10-Worst pain ever Pain Location: bilateral feet/ankles Pain Intervention(s): Monitored during session; notified nurse     Hand Dominance Right   Extremity/Trunk Assessment Upper Extremity Assessment Upper Extremity Assessment: Overall WFL for tasks assessed; tremors in bilateral UEs   Lower Extremity Assessment Lower Extremity Assessment: Defer to PT evaluation       Communication Communication Communication: No difficulties   Cognition Arousal/Alertness: Awake/alert Behavior During Therapy: WFL for tasks assessed/performed;Flat affect Overall Cognitive Status: History of cognitive impairments at baseline                      General Comments       Exercises       Shoulder Instructions      Home Living Family/patient expects to be discharged to:: Unsure Living Arrangements: Non-relatives/Friends (friend lives there intermittently) Available Help at Discharge: Friend(s);Available PRN/intermittently Type of Home: House Home Access: Stairs to enter Entrance Stairs-Number of Steps: 3 Entrance Stairs-Rails: None (via side door) Home Layout: Able to live on main level with bedroom/bathroom;Two level  Home Equipment: None   Additional Comments: according to PT eval, does microwave pre-made meals      Prior Functioning/Environment Level of Independence: Needs assistance  Gait / Transfers Assistance Needed: ambulates without device ADL's / Homemaking Assistance Needed: pt reports he changes clothes and bathes 1x/week; father grocery shops for him; pt reports friend that is there sometimes cooks         OT Diagnosis: Generalized weakness;Acute pain   OT Problem List: Decreased strength;Decreased activity tolerance;Pain;Decreased knowledge of precautions;Decreased knowledge of use of DME or AE;Decreased cognition   OT Treatment/Interventions: DME and/or AE instruction;Patient/family education;Balance training;Therapeutic activities;Cognitive remediation/compensation;Self-care/ADL training;Therapeutic exercise;Energy conservation    OT Goals(Current goals can be found in the care plan section) Acute Rehab OT Goals Patient Stated Goal: not stated OT Goal Formulation: With patient Time For Goal Achievement: 02/26/15 Potential to Achieve Goals: Good ADL Goals Pt Will Perform Lower Body Bathing: sit to/from stand;with set-up Pt Will Perform Lower Body Dressing: sit to/from stand;with set-up Pt Will Transfer to Toilet: with modified independence;ambulating;bedside commode Pt Will Perform Toileting - Clothing Manipulation and hygiene: sit to/from stand;with set-up  OT Frequency: Min 2X/week   Barriers to D/C:            Co-evaluation              End of Session Equipment Utilized During Treatment: Gait belt;Rolling walker Nurse Communication: Mobility status;Other (comment) (productive cough; pain level)  Activity Tolerance: Patient limited by fatigue Patient left: in bed;with call bell/phone within reach;with bed alarm set   Time: 1616-1630 OT Time Calculation (min): 14 min Charges:  OT General Charges $OT Visit: 1 Procedure OT Evaluation $Initial OT Evaluation Tier I: 1 Procedure G-CodesBenito Mccreedy OTR/L [phone] 02/19/2015, 4:44 PM
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
## 2015-02-19 NOTE — Evaluation (Addendum)
Physical Therapy Evaluation Patient Details Name: Jonathan [name] MRN: [id number] DOB: 05/25/1955 Today's Date: 02/19/2015   History of Present Illness  Patient 59 y.o. male with history of traumatic brain injury, cognitive impairment, possible depression, tobacco and alcohol abuse, seizure disorder-unclear if related to his TBI or alcohol withdrawal, initially presented to the [facility] with complaints of profound weight loss, chronic productive cough and hemoptysis  Clinical Impression  Patient with baseline cognitive deficits. Noted bil ankle swelling and tenderness of unknown etiology (pt denies falls).  Patient felt lightheaded and weak during gait today, post exercise HR 97 bpm, 99% oxygen saturation on RA.  Will continue to follow patient while on this venue of care to progress mobility. Therapy will continue to follow to assist with discharge planning and follow up recommendations.    Follow Up Recommendations SNF;Supervision/Assistance - 24 hour    Equipment Recommendations  Rolling walker with 5" wheels    Recommendations for Other Services       Precautions / Restrictions Precautions Precautions: Fall Precaution Comments: decr cognition Restrictions Weight Bearing Restrictions: No      Mobility  Bed Mobility Overal bed mobility: Needs Assistance Bed Mobility: Supine to Sit;Sit to Supine     Supine to sit: Supervision (with rail) Sit to supine: Min assist (with leg management)   General bed mobility comments: raised HOB  Transfers Overall transfer level: Needs assistance Equipment used: Rolling walker (2 wheeled) Transfers: Sit to/from [facility] Sit to Stand: Min guard Stand pivot transfers: Min assist       General transfer comment: min cues for hand placement for safety  Ambulation/Gait Ambulation/Gait assistance: Min assist Ambulation Distance (Feet): 10 Feet Assistive device: Rolling walker (2 wheeled) Gait  Pattern/deviations: Shuffle;Trunk flexed     General Gait Details: ambulated to door, needed to slide back while sitting in chair to return to [employer] Mobility    Modified Rankin (Stroke Patients Only)       Balance Overall balance assessment: Needs assistance Sitting-balance support: No upper extremity supported;Feet supported Sitting balance-Leahy Scale: Fair     Standing balance support: Bilateral upper extremity supported Standing balance-Leahy Scale: Fair                               Pertinent Vitals/Pain Pain Assessment: No/denies pain    Home Living Family/patient expects to be discharged to:: Private residence Living Arrangements: Non-relatives/Friends (friend lives their intermittently) Available Help at Discharge: Friend(s);Available PRN/intermittently Type of Home: House Home Access: Stairs to enter Entrance Stairs-Rails: None (via side door) Entrance Stairs-Number of Steps: 3 Home Layout: Able to live on main level with bedroom/bathroom;Two level Home Equipment: None Additional Comments: does microwave pre-made meals    Prior Function Level of Independence: Independent         Comments: ambulate without device, father visits once weekly to bring groceries and check in on patient     Hand Dominance   Dominant Hand: Right    Extremity/Trunk Assessment   Upper Extremity Assessment: Generalized weakness           Lower Extremity Assessment: Generalized weakness      Cervical / Trunk Assessment: Kyphotic  Communication   Communication: No difficulties  Cognition Arousal/Alertness: Awake/alert Behavior During Therapy: WFL for tasks assessed/performed;Flat affect Overall Cognitive Status: Within Functional Limits for tasks assessed  General Comments General comments (skin integrity, edema, etc.): productive cough that is yellow/green    Exercises Other  Exercises Other Exercises: ankle pumps, supine x 5 bil Other Exercises: LAQ, sitting x 5 reps bil      Assessment/Plan    PT Assessment Patient needs continued PT services  PT Diagnosis Difficulty walking;Generalized weakness   PT Problem List Decreased strength;Decreased activity tolerance;Decreased balance;Decreased mobility;Decreased knowledge of use of DME;Decreased cognition;Decreased safety awareness;Decreased knowledge of precautions  PT Treatment Interventions DME instruction;Gait training;Stair training;Functional mobility training;Therapeutic activities;Therapeutic exercise;Balance training;Neuromuscular re-education;Patient/family education   PT Goals (Current goals can be found in the Care Plan section) Acute Rehab PT Goals Patient Stated Goal: get my head to feel better PT Goal Formulation: With patient Time For Goal Achievement: 03/05/15 Potential to Achieve Goals: Good    Frequency Min 2X/week   Barriers to discharge Decreased caregiver support unknown if family able to provide greater level of asssit    Co-evaluation               End of Session Equipment Utilized During Treatment: Gait belt Activity Tolerance: Patient limited by fatigue;Patient limited by lethargy Patient left: in bed;with call bell/phone within reach;with bed alarm set Nurse Communication: Mobility status         Time: 1055-1130 PT Time Calculation (min) (ACUTE ONLY): 35 min   Charges:   PT Evaluation $Initial PT Evaluation Tier I: 1 Procedure PT Treatments $Gait Training: 8-22 mins   PT G CodesMalka So, PT [phone]  [place] 02/19/2015, 12:22 PM

## 2015-02-19 NOTE — Progress Notes (Signed)
PROGRESS NOTE    Jonathan [name] ZOX:[id number] DOB: 1956-02-20 DOA: 02/18/2015 PCP: No PCP Per Patient Outpatient Specialists:   Neurology: At [facility].  HPI/Brief narrative 59 y.o. male , separated, apparently lives alone, depends on elderly parents for financial and other support, history of traumatic brain injury, cognitive impairment, possible depression, tobacco and alcohol abuse, seizure disorder-unclear if related to his TBI or alcohol withdrawal, initially presented to the [facility] with complaints of profound weight loss, chronic productive cough and hemoptysis. 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 He was treated with IV vancomycin, Zosyn and discharged 9/29 on voriconazole. However he never picked up Voriconazole and was readmitted to [facility] on 10/2 with septic shock which has since resolved. He was evaluated by surgery (Dr. Nestor Lewandowsky) on 02/08/15 who recommended that he will likely require thoracotomy and was transferred to [facility] on 10/7. TCTS & ID consulted.   Assessment/Plan:  Chronic Cavitary Aspergillosis/left upper lobe - Patient has been evaluated by infectious disease, CCM and surgery at [facility] - For now continue IV vancomycin, Zosyn and voriconazole as per ID recommendations. ID consulted to see if he needs to continue antibiotics. - Thoracic surgery consulted on 10/7. Input pending. - Workup thus far: Sputum culture 10/6: Appears normal. [place]-final results pending. AFB culture 9/212 & 9/20-pending. Sputum culture 02/02/15: Mold-previous mold sent to the state lab for identification. Light growth of Candida species. Rapid HIV screen: Nonreactive. QuantiFERON TB gold: Indeterminate. Blood culture 9/20: Negative. Sputum culture 9/20: Mold-sent to state for identification  Paroxysmal Atrial fibrillation - On amiodarone. Not anticoagulation candidate secondary to  hemoptysis from lung lesion. Monitor on telemetry. Will repeat EKG. Currently in sinus rhythm.  Anemia of chronic disease - Status post PRBC transfusion 1 unit. Hemoglobin stable over the last 24 hours. Follow CBC and transfuse if hemoglobin less than 7 g per DL.  Seizure disorder - Unclear if related to his traumatic brain injury or alcohol withdrawal. Continue Topamax. Follows at [facility] neurology.  Hypokalemia - Replace as needed and follow. Magnesium normal.  Bilateral nonocclusive upper extremity DVT - Not on anticoagulation secondary to hemoptysis from lung lesion.  Non-severe (moderate) malnutrition in context of social or environmental circumstances - Continue nutritional supplements and dietitian input appreciated.  Tobacco abuse - Cessation counseled  Alcohol dependence - Continue multivitamins, folate and thiamine supplements. Likely out of window for withdrawal (has been hospitalized since 01/14/15). However monitor closely.  Chronic tremors - ? Benign essential tremors versus related to alcohol dependence.  History of traumatic brain injury and cognitive impairment  Adult failure to thrive  Depression - Continue Celexa   DVT prophylaxis: SCD's Code Status: Full  Family Communication: None at bedside  Disposition Plan: DC home  when medically stable.    Consultants:  Thoracic Surgery-input pending  Infectious disease-input pending  Procedures:  None  Antibiotics:  IV Vancomycin 10/7>  IV Zosyn 10/7>  IV Voriconazole 10/7>   Subjective: Dyspnea. As per nursing, no acute issues.  Objective: Filed Vitals:   02/18/15 1644 02/18/15 2122 02/19/15 0557 02/19/15 0928  BP: 105/62 116/61 115/64 108/52  Pulse: 76 75 83 80  Temp: 97.4 F (36.3 C) 98.3 F (36.8 C) 98 F (36.7 C) 98.5 F (36.9 C)  TempSrc: Oral Oral Oral Axillary  Resp: 20 20    Height: 5\' 9"  (1.753 m)     Weight: 61.3 kg (135 lb 2.3 oz)  61 kg (134 lb  7.7 oz)   SpO2: 97% 97%  93% 96%    Intake/Output Summary (Last 24 hours) at 02/19/15 1157 Last data filed at 02/19/15 0955  Gross per 24 hour  Intake    480 ml  Output   1501 ml  Net  -1021 ml   Filed Weights   02/18/15 1644 02/19/15 0557  Weight: 61.3 kg (135 lb 2.3 oz) 61 kg (134 lb 7.7 oz)     Exam:  General exam: Moderately built and poorly nourished middle-aged male patient sitting up comfortably in bed eating breakfast this morning. Respiratory system: Harsh breath sounds, particularly in the bases with scattered crackles in the left base. No increased work of breathing. Cardiovascular system: S1 & S2 heard, RRR. No JVD, murmurs, gallops, clicks. Trace bilateral ankle edema. Telemetry: Sinus rhythm. Gastrointestinal system: Abdomen is nondistended, soft and nontender. Normal bowel sounds heard. Central nervous system: Alert and oriented 2.. No focal neurological deficits. Extremities: Symmetric 5 x 5 power.   Data Reviewed: Basic Metabolic Panel:  Recent Labs Lab 02/13/15 0945 02/14/15 0645 02/16/15 0506 02/18/15 0559 02/18/15 1937 02/19/15 0418  NA 140  --   --  143 138 144  K 3.5  --   --  3.0* 3.3* 3.3*  CL 110  --   --  112* 106 108  CO2 26  --   --  24 25 27   GLUCOSE 114*  --   --  87 90 88  BUN 12  --   --  9 10 10   CREATININE 0.82 0.72 0.67 0.73 0.69 0.73  CALCIUM 8.2*  --   --  8.0* 7.9* 8.2*  MG  --   --   --  1.8 2.0  --    Liver Function Tests:  Recent Labs Lab 02/13/15 0945  AST 26  ALT 25  ALKPHOS 70  BILITOT 0.4  PROT 6.5  ALBUMIN 2.0*   No results for input(s): LIPASE, AMYLASE in the last 168 hours. No results for input(s): AMMONIA in the last 168 hours. CBC:  Recent Labs Lab 02/13/15 0945 02/15/15 0445 02/16/15 0506 02/17/15 0431 02/18/15 0559 02/19/15 0418  WBC 12.9* 10.5 14.7* 15.2* 10.8* 13.0*  NEUTROABS 11.0*  --   --   --   --   --   HGB 7.6* 6.6* 6.7* 8.7* 8.5* 8.1*  HCT 23.3* 19.8* 21.4* 26.5* 25.1* 24.9*  MCV 91.7 91.0 93.7 89.5 91.1  92.9  PLT 756* 676* 720* 729* 674* 650*   Cardiac Enzymes:  Recent Labs Lab 02/13/15 0945  TROPONINI <0.03   BNP (last 3 results) No results for input(s): PROBNP in the last 8760 hours. CBG: No results for input(s): GLUCAP in the last 168 hours.  Recent Results (from the past 240 hour(s))  Blood Culture (routine x 2)     Status: None   Collection Time: 02/13/15 10:14 AM  Result Value Ref Range Status   Specimen Description BLOOD RIGHT ARM  Final   Special Requests BOTTLES DRAWN AEROBIC AND ANAEROBIC  [place]  Final   Culture NO GROWTH 5 DAYS  Final   Report Status 02/18/2015 FINAL  Final  Blood Culture (routine x 2)     Status: None   Collection Time: 02/13/15 10:15 AM  Result Value Ref Range Status   Specimen Description BLOOD RIGHT ASSIST CONTROL  Final   Special Requests BOTTLES DRAWN AEROBIC AND ANAEROBIC  4CC  Final   Culture NO GROWTH 5 DAYS  Final   Report Status  02/18/2015 FINAL  Final  Urine culture     Status: None   Collection Time: 02/13/15  1:05 PM  Result Value Ref Range Status   Specimen Description URINE, CLEAN CATCH  Final   Special Requests NONE  Final   Culture 60,000 COLONIES/ml KLEBSIELLA PNEUMONIAE  Final   Report Status 02/15/2015 FINAL  Final   Organism ID, Bacteria KLEBSIELLA PNEUMONIAE  Final      Susceptibility   Klebsiella pneumoniae - MIC*    AMPICILLIN >=32 RESISTANT Resistant     CEFAZOLIN <=4 SENSITIVE Sensitive     CEFTRIAXONE <=1 SENSITIVE Sensitive     CIPROFLOXACIN <=0.25 SENSITIVE Sensitive     GENTAMICIN <=1 SENSITIVE Sensitive     IMIPENEM <=0.25 SENSITIVE Sensitive     NITROFURANTOIN 128 RESISTANT Resistant     TRIMETH/SULFA <=20 SENSITIVE Sensitive     PIP/TAZO Value in next row Sensitive      SENSITIVE<=4    * 60,000 COLONIES/ml KLEBSIELLA PNEUMONIAE  Culture, expectorated sputum-assessment     Status: None   Collection Time: 02/17/15  4:54 PM  Result Value Ref Range Status   Specimen Description EXPECTORATED SPUTUM  Final     Special Requests Immunocompromised  Final   Sputum evaluation THIS SPECIMEN IS ACCEPTABLE FOR SPUTUM CULTURE  Final   Report Status 02/17/2015 FINAL  Final  Culture, respiratory (NON-Expectorated)     Status: None (Preliminary result)   Collection Time: 02/17/15  4:54 PM  Result Value Ref Range Status   Specimen Description EXPECTORATED SPUTUM  Final   Special Requests Immunocompromised Reflexed from [id number]  Final   Gram Stain   Final    MODERATE WBC SEEN FEW GRAM POSITIVE COCCI EXCELLENT SPECIMEN - 90-100% WBCS    Culture APPEARS TO BE NORMAL RESPIRATORY FLORA  Final   Report Status PENDING  Incomplete           Studies: No results found.      Scheduled Meds: . amiodarone  200 mg Oral Daily  . citalopram  20 mg Oral BH-q7a  . feeding supplement (ENSURE ENLIVE)  237 mL Oral BID BM  . folic acid  1 mg Oral Daily  . guaiFENesin  1,200 mg Oral BID  . mirtazapine  15 mg Oral QHS  . multivitamin with minerals  1 tablet Oral Daily  . piperacillin-tazobactam (ZOSYN)  IV  3.375 g Intravenous Q8H  . potassium chloride  40 mEq Oral Q4H  . primidone  200 mg Oral QHS  . sodium chloride  3 mL Intravenous Q12H  . thiamine  100 mg Oral Daily  . topiramate  25 mg Oral BID  . vancomycin  1,000 mg Intravenous Q12H  . voriconazole  4 mg/kg Intravenous Q12H   Continuous Infusions: . 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 Active Problems:   Benign essential tremor   Protein-calorie malnutrition, severe (HCC)   Dementia   Tobacco abuse   Alcohol dependence (HCC)   History of traumatic brain injury-with cognitive impairment.   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 [facility] Pager [phone]  If  7PM-7AM, please contact night-coverage [email or web address] Password [email or web address] 02/19/2015, 11:57 AM    LOS: 1 day

## 2015-02-19 NOTE — Progress Notes (Signed)
Utilization review completed.  

## 2015-02-19 NOTE — Progress Notes (Addendum)
Initial Nutrition Assessment  DOCUMENTATION CODES:  Non-severe (moderate) malnutrition in context of social or environmental circumstances  INTERVENTION:  Ensure Enlive po BID, each supplement provides 350 kcal and 20 grams of protein  NUTRITION DIAGNOSIS:  Inability to manage self care related to lethargy/confusion, cognitive deficits as evidenced by moderate depletion of body fat, mild depletion of muscle mass.  GOAL:  Patient will meet greater than or equal to 90% of their needs  MONITOR:  PO intake, Supplement acceptance, Weight trends, Labs, I & O's  REASON FOR ASSESSMENT:  Consult Malnutrition  ASSESSMENT:  60 y/o history of TBI, cognitive impairment, possible depression, tobacco and alcohol abuse, seizure disorder who initially presented to [facility] Wt loss. Recently diagnosed with likelychronic Cavitatory aspergillosis.  Was diagnosed with severe malnutrition by [place] RD 10/7. Pts overall meal intake since he has been in the hospital have been good. Most recently 60-100% of meals and drinking Ensure.   Pt denies n/v. He does have c/d.   Pt was able to give very little information about how he was eating prior to his admission at [facility]. He says he does not know if he was taking any vitamins and could not describe his eating pattern. RD assessment is that pt was had decreased intake prior to admission d/t cognitive deficits and trouble preparing own meals. Question his ability to maintain good PO intake on his own. Pt seems he eats very well when food is given to him though.   Pt was weighed on 9/20 as 103 lbs. This would appear to be substantial outlier. Obtained bed-weight which was 134.6 lbs.Very little else was on bed besides sheets. He is likely ~130-132 lbs.  NFPE-Mild Moderate Fat wasting, mild muscle wasting, no edema  Diet Order:  Diet regular Room service appropriate?: Yes; Fluid consistency:: Thin  Skin:  Ecchymosis  Last BM:  10/7  Height:  Ht Readings  from Last 1 Encounters:  02/18/15 5\' 9"  (1.753 m)   Weight:  Wt Readings from Last 1 Encounters:  02/19/15 134 lb 7.7 oz (61 kg)   Wt Readings from Last 10 Encounters:  02/19/15 134 lb 7.7 oz (61 kg)  02/13/15 131 lb 3.2 oz (59.512 kg)  02/10/15 130 lb 4.8 oz (59.104 kg)  Wt 9/20: 103 lbs.   Ideal Body Weight:  72.73 kg  BMI:  Body mass index is 19.85 kg/(m^2).  Estimated Nutritional Needs:  Kcal:  1800-2000 kcals Protein:  71-83g Pro  Fluid:  1.8-2 liters fluid  EDUCATION NEEDS:  No education needs identified at this time  Burtis Junes RD, LDN Nutrition Pager: [phone] 02/19/2015 10:31 AM

## 2015-02-19 NOTE — Consult Note (Signed)
[facility] for Infectious Disease      Reason for Consult: likely pulmonary aspergillosis    Referring Physician: Dr. 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 Active Problems:   Benign essential tremor   Protein-calorie malnutrition, severe (HCC)   Dementia   Tobacco abuse   Alcohol dependence (HCC)   History of traumatic brain injury-with cognitive impairment.   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 amiodarone  200 mg Oral Daily  . citalopram  20 mg Oral BH-q7a  . feeding supplement (ENSURE ENLIVE)  237 mL Oral BID BM  . folic acid  1 mg Oral Daily  . guaiFENesin  1,200 mg Oral BID  . mirtazapine  15 mg Oral QHS  . multivitamin with minerals  1 tablet Oral Daily  . piperacillin-tazobactam (ZOSYN)  IV  3.375 g Intravenous Q8H  . primidone  200 mg Oral QHS  . sodium chloride  3 mL Intravenous Q12H  . thiamine  100 mg Oral Daily  . topiramate  25 mg Oral BID  . vancomycin  1,000 mg Intravenous Q12H  . voriconazole  4 mg/kg Intravenous Q12H    Recommendations: Voriconazole, likely lifelong CT surgery to see  Will stop antibacterials  Assessment: He has a pulmonary cavitary lesion with mold growing on 2 sputum samples c/w aspergilloma.  He was evaluated in [place] and was to start voriconazole but did not fill prescription and respresented to [facility].  He was then to be transferred to ID service at [facility] but no beds available and came here.  Has been on vancomycin and zosyn.  Afebrile here.  CT scan, CXR, micro reviewed.  Previous hosptialization reviewed.   Antibiotics: Vancomycin, zosyn, voriconazole  HPI: Jonathan [name] is a 59 y.o. male with tobacco and alcohol abuse, seizure disorder, has been in [facility] twice for pulmonary mass concerning for aspergilloma.  Growing mold on 2 samples, not yet  identified.  Some increased sputum production.  Was to start voriconazole but did not fill it.  Here for evaluation by CT surgery for possible resection.      Review of Systems: A comprehensive review of systems was negative except for: poor appetite  Past Medical History  Diagnosis Date  . Seizures ([place])   . Coarse tremors   . Traumatic brain injury ([place])   . Cognitive impairment   . Depression   . Alcohol dependence ([place])   . Tobacco abuse     Social History  Substance Use Topics  . Smoking status: Current Every Day Smoker -- 1.50 packs/day  . Smokeless tobacco: Not on file  . Alcohol Use: Yes    No family history on file. No Known Allergies  OBJECTIVE: Blood pressure 108/52, pulse 80, temperature 98.5 F (36.9 C), temperature source Axillary, resp. rate 20, height 5\' 9"  (1.753 m), weight 134 lb 7.7 oz (61 kg), SpO2 96 %. General: awake, alert, chronically ill appearing HEENT: anicteric Skin: no rashes Lungs: CTA B, diminished bs Cor: RRR Abdomen: soft, nt, nd Ext: no edema Neuro: non-focal   Microbiology: Recent Results (from the past 240 hour(s))  Blood Culture (routine x 2)     Status: None   Collection Time: 02/13/15 10:14 AM  Result Value Ref Range Status   Specimen Description BLOOD RIGHT ARM  Final   Special Requests BOTTLES DRAWN AEROBIC  AND ANAEROBIC  [place]  Final   Culture NO GROWTH 5 DAYS  Final   Report Status 02/18/2015 FINAL  Final  Blood Culture (routine x 2)     Status: None   Collection Time: 02/13/15 10:15 AM  Result Value Ref Range Status   Specimen Description BLOOD RIGHT ASSIST CONTROL  Final   Special Requests BOTTLES DRAWN AEROBIC AND ANAEROBIC  4CC  Final   Culture NO GROWTH 5 DAYS  Final   Report Status 02/18/2015 FINAL  Final  Urine culture     Status: None   Collection Time: 02/13/15  1:05 PM  Result Value Ref Range Status   Specimen Description URINE, CLEAN CATCH  Final   Special Requests NONE  Final   Culture 60,000  COLONIES/ml KLEBSIELLA PNEUMONIAE  Final   Report Status 02/15/2015 FINAL  Final   Organism ID, Bacteria KLEBSIELLA PNEUMONIAE  Final      Susceptibility   Klebsiella pneumoniae - MIC*    AMPICILLIN >=32 RESISTANT Resistant     CEFAZOLIN <=4 SENSITIVE Sensitive     CEFTRIAXONE <=1 SENSITIVE Sensitive     CIPROFLOXACIN <=0.25 SENSITIVE Sensitive     GENTAMICIN <=1 SENSITIVE Sensitive     IMIPENEM <=0.25 SENSITIVE Sensitive     NITROFURANTOIN 128 RESISTANT Resistant     TRIMETH/SULFA <=20 SENSITIVE Sensitive     PIP/TAZO Value in next row Sensitive      SENSITIVE<=4    * 60,000 COLONIES/ml KLEBSIELLA PNEUMONIAE  Culture, expectorated sputum-assessment     Status: None   Collection Time: 02/17/15  4:54 PM  Result Value Ref Range Status   Specimen Description EXPECTORATED SPUTUM  Final   Special Requests Immunocompromised  Final   Sputum evaluation THIS SPECIMEN IS ACCEPTABLE FOR SPUTUM CULTURE  Final   Report Status 02/17/2015 FINAL  Final  Culture, respiratory (NON-Expectorated)     Status: None (Preliminary result)   Collection Time: 02/17/15  4:54 PM  Result Value Ref Range Status   Specimen Description EXPECTORATED SPUTUM  Final   Special Requests Immunocompromised Reflexed from [id number]  Final   Gram Stain   Final    MODERATE WBC SEEN FEW GRAM POSITIVE COCCI EXCELLENT SPECIMEN - 90-100% WBCS    Culture APPEARS TO BE NORMAL RESPIRATORY FLORA  Final   Report Status PENDING  Incomplete    Scharlene Gloss, [place] for Infectious Disease [facility] [email or web address] [phone] pager  [phone] cell 02/19/2015, 2:28 PM

## 2015-02-20 DIAGNOSIS — E440 Moderate protein-calorie malnutrition: Secondary | ICD-10-CM | POA: Insufficient documentation

## 2015-02-20 DIAGNOSIS — B441 Other pulmonary aspergillosis: Principal | ICD-10-CM

## 2015-02-20 LAB — COMPREHENSIVE METABOLIC PANEL
ALT: 10 U/L — AB (ref 17–63)
AST: 14 U/L — ABNORMAL LOW (ref 15–41)
Albumin: 1.6 g/dL — ABNORMAL LOW (ref 3.5–5.0)
Alkaline Phosphatase: 55 U/L (ref 38–126)
Anion gap: 9 (ref 5–15)
BILIRUBIN TOTAL: 0.2 mg/dL — AB (ref 0.3–1.2)
BUN: 8 mg/dL (ref 6–20)
CHLORIDE: 107 mmol/L (ref 101–111)
CO2: 26 mmol/L (ref 22–32)
CREATININE: 0.65 mg/dL (ref 0.61–1.24)
Calcium: 8.2 mg/dL — ABNORMAL LOW (ref 8.9–10.3)
Glucose, Bld: 87 mg/dL (ref 65–99)
Potassium: 3.6 mmol/L (ref 3.5–5.1)
Sodium: 142 mmol/L (ref 135–145)
TOTAL PROTEIN: 5.4 g/dL — AB (ref 6.5–8.1)

## 2015-02-20 LAB — VITAMIN B12: VITAMIN B 12: 242 pg/mL (ref 180–914)

## 2015-02-20 LAB — CBC
HCT: 25.4 % — ABNORMAL LOW (ref 39.0–52.0)
Hemoglobin: 8.1 g/dL — ABNORMAL LOW (ref 13.0–17.0)
MCH: 30.1 pg (ref 26.0–34.0)
MCHC: 31.9 g/dL (ref 30.0–36.0)
MCV: 94.4 fL (ref 78.0–100.0)
PLATELETS: 620 10*3/uL — AB (ref 150–400)
RBC: 2.69 MIL/uL — AB (ref 4.22–5.81)
RDW: 16.9 % — AB (ref 11.5–15.5)
WBC: 11.8 10*3/uL — ABNORMAL HIGH (ref 4.0–10.5)

## 2015-02-20 LAB — FOLATE: FOLATE: 23.1 ng/mL (ref >5.9)

## 2015-02-20 LAB — CULTURE, RESPIRATORY W GRAM STAIN

## 2015-02-20 LAB — IRON AND TIBC
Iron: 13 ug/dL — ABNORMAL LOW (ref 45–182)
SATURATION RATIOS: 5 % — AB (ref 17.9–39.5)
TIBC: 238 ug/dL — ABNORMAL LOW (ref 250–450)
UIBC: 225 ug/dL

## 2015-02-20 LAB — RETICULOCYTES
RBC.: 2.96 MIL/uL — AB (ref 4.22–5.81)
RETIC COUNT ABSOLUTE: 88.8 10*3/uL (ref 19.0–186.0)
Retic Ct Pct: 3 % (ref 0.4–3.1)

## 2015-02-20 LAB — FERRITIN: Ferritin: 219 ng/mL (ref 24–336)

## 2015-02-20 LAB — CULTURE, RESPIRATORY: CULTURE: NORMAL

## 2015-02-20 MED ORDER — VORICONAZOLE 200 MG PO TABS
200.0000 mg | ORAL_TABLET | Freq: Two times a day (BID) | ORAL | Status: DC
  Administered 2015-02-20 – 2015-02-25 (×10): 200 mg via ORAL
  Filled 2015-02-20 (×11): qty 1

## 2015-02-20 MED ORDER — INFLUENZA VAC SPLIT QUAD 0.5 ML IM SUSY
0.5000 mL | PREFILLED_SYRINGE | INTRAMUSCULAR | Status: DC
  Filled 2015-02-20: qty 0.5

## 2015-02-20 NOTE — Progress Notes (Signed)
[facility]   Date of Admission:  02/18/2015  Antibiotics: voriconazole  Subjective: No change in cough, sob  Objective: Temp:  [97.9 F (36.6 C)-98.5 F (36.9 C)] 98.5 F (36.9 C) (10/09 1236) Pulse Rate:  [76-82] 82 (10/09 1236) Resp:  [18-20] 20 (10/09 1236) BP: (99-110)/(57-65) 100/61 mmHg (10/09 1236) SpO2:  [96 %-99 %] 99 % (10/09 1236) Weight:  [133 lb 14.4 oz (60.737 kg)] 133 lb 14.4 oz (60.737 kg) (10/09 0430)  General: awake, alert, eating Skin: no rashes Lungs: + congestion, no crackles Cor: RRR Abdomen: soft, nt  A comprehensive review of systems was negative except for: Respiratory: positive for dyspnea on exertion  Lab Results Lab Results  Component Value Date   WBC 11.8* 02/20/2015   HGB 8.1* 02/20/2015   HCT 25.4* 02/20/2015   MCV 94.4 02/20/2015   PLT 620* 02/20/2015    Lab Results  Component Value Date   CREATININE 0.65 02/20/2015   BUN 8 02/20/2015   NA 142 02/20/2015   K 3.6 02/20/2015   CL 107 02/20/2015   CO2 26 02/20/2015    Lab Results  Component Value Date   ALT 10* 02/20/2015   AST 14* 02/20/2015   ALKPHOS 55 02/20/2015   BILITOT 0.2* 02/20/2015      Microbiology: Recent Results (from the past 240 hour(s))  Blood Culture (routine x 2)     Status: None   Collection Time: 02/13/15 10:14 AM  Result Value Ref Range Status   Specimen Description BLOOD RIGHT ARM  Final   Special Requests BOTTLES DRAWN AEROBIC AND ANAEROBIC  [employer] [facility] [employer]  Final   Culture NO GROWTH 5 DAYS  Final   Report Status 02/18/2015 FINAL  Final  Blood Culture (routine x 2)     Status: None   Collection Time: 02/13/15 10:15 AM  Result Value Ref Range Status   Specimen Description BLOOD RIGHT ASSIST CONTROL  Final   Special Requests BOTTLES DRAWN AEROBIC AND ANAEROBIC  4CC  Final   Culture NO GROWTH 5 DAYS  Final   Report Status 02/18/2015 FINAL  Final  Urine culture     Status: None   Collection Time: 02/13/15  1:05 PM  Result  Value Ref Range Status   Specimen Description URINE, CLEAN CATCH  Final   Special Requests NONE  Final   Culture 60,000 COLONIES/ml KLEBSIELLA PNEUMONIAE  Final   Report Status 02/15/2015 FINAL  Final   Organism ID, Bacteria KLEBSIELLA PNEUMONIAE  Final      Susceptibility   Klebsiella pneumoniae - MIC*    AMPICILLIN >=32 RESISTANT Resistant     CEFAZOLIN <=4 SENSITIVE Sensitive     CEFTRIAXONE <=1 SENSITIVE Sensitive     CIPROFLOXACIN <=0.25 SENSITIVE Sensitive     GENTAMICIN <=1 SENSITIVE Sensitive     IMIPENEM <=0.25 SENSITIVE Sensitive     NITROFURANTOIN 128 RESISTANT Resistant     TRIMETH/SULFA <=20 SENSITIVE Sensitive     PIP/TAZO Value in next row Sensitive      SENSITIVE<=4    * 60,000 COLONIES/ml KLEBSIELLA PNEUMONIAE  Culture, expectorated sputum-assessment     Status: None   Collection Time: 02/17/15  4:54 PM  Result Value Ref Range Status   Specimen Description EXPECTORATED SPUTUM  Final   Special Requests Immunocompromised  Final   Sputum evaluation THIS SPECIMEN IS ACCEPTABLE FOR SPUTUM CULTURE  Final   Report Status 02/17/2015 FINAL  Final  Culture, respiratory (NON-Expectorated)     Status: None  Collection Time: 02/17/15  4:54 PM  Result Value Ref Range Status   Specimen Description EXPECTORATED SPUTUM  Final   Special Requests Immunocompromised Reflexed from [id number]  Final   Gram Stain   Final    MODERATE WBC SEEN FEW GRAM POSITIVE COCCI EXCELLENT SPECIMEN - 90-100% WBCS    Culture APPEARS TO BE NORMAL RESPIRATORY FLORA  Final   Report Status 02/20/2015 FINAL  Final    Studies/Results: No results found.  Assessment/Plan:  1) chronic cavitary aspergilloma - Dr. Vivi Martens noted reviewed.  Not a surgical candidate.  Will need to continue voriconazole lifelong.  I will send a message to Dr. Ola Spurr for follow up in [place] for [place].    Pharmacy can change it to oral voriconazole.    I will sign off, please call with questions.   Scharlene Gloss,  [facility] [place] [email or web address] [phone] pager   [phone] cell 02/20/2015, 12:59 PM

## 2015-02-20 NOTE — Progress Notes (Signed)
ANTIBIOTIC CONSULT NOTE - FOLLOW UP  Pharmacy Consult for voriconazole Indication: chronic cavitary aspergilloma  No Known Allergies  Patient Measurements: Height: 5\' 9"  (175.3 cm) Weight: 133 lb 14.4 oz (60.737 kg) IBW/kg (Calculated) : 70.7  Vital Signs: Temp: 98.5 F (36.9 C) (10/09 1236) Temp Source: Oral (10/09 1236) BP: 100/61 mmHg (10/09 1236) Pulse Rate: 82 (10/09 1236) Intake/Output from previous day: 10/08 0701 - 10/09 0700 In: 1815 [P.O.:1440; IV Piggyback:375] Out: 1954 [Urine:1950; Stool:4] Intake/Output from this shift: Total I/O In: 240 [P.O.:240] Out: -   Labs:  Recent Labs  02/18/15 0559 02/18/15 1937 02/19/15 0418 02/20/15 0548  WBC 10.8*  --  13.0* 11.8*  HGB 8.5*  --  8.1* 8.1*  PLT 674*  --  650* 620*  CREATININE 0.73 0.69 0.73 0.65   Estimated Creatinine Clearance: 87.5 mL/min (by C-G formula based on Cr of 0.65). No results for input(s): VANCOTROUGH, VANCOPEAK, VANCORANDOM, GENTTROUGH, GENTPEAK, GENTRANDOM, TOBRATROUGH, TOBRAPEAK, TOBRARND, AMIKACINPEAK, AMIKACINTROU, AMIKACIN in the last 72 hours.   Microbiology: Recent Results (from the past 720 hour(s))  Blood culture (routine x 2)     Status: None   Collection Time: 02/01/15  7:41 AM  Result Value Ref Range Status   Specimen Description BLOOD LEFT HAND  Final   Special Requests   Final    BOTTLES DRAWN AEROBIC AND ANAEROBIC 2 CC AERO 1 CC ANAERO   Culture NO GROWTH 5 DAYS  Final   Report Status 02/06/2015 FINAL  Final  Culture, expectorated sputum-assessment     Status: None   Collection Time: 02/01/15  7:42 AM  Result Value Ref Range Status   Specimen Description EXPECTORATED SPUTUM  Final   Special Requests Immunocompromised  Final   Sputum evaluation THIS SPECIMEN IS ACCEPTABLE FOR SPUTUM CULTURE  Final   Report Status 02/01/2015 FINAL  Final  Culture, respiratory (NON-Expectorated)     Status: None (Preliminary result)   Collection Time: 02/01/15  7:42 AM  Result Value Ref  Range Status   Specimen Description EXPECTORATED SPUTUM  Final   Special Requests Immunocompromised Reflexed from [id number]  Final   Gram Stain   Final    EXCELLENT SPECIMEN - 90-100% WBCS MANY WBC SEEN FEW GRAM NEGATIVE RODS MANY GRAM POSITIVE COCCI IN CHAINS    Culture MOLD SENT TO STATE FOR IDENTIFICATION  Final   Report Status PENDING  Incomplete  Blood culture (routine x 2)     Status: None   Collection Time: 02/01/15  7:43 AM  Result Value Ref Range Status   Specimen Description BLOOD RIGHT ARM  Final   Special Requests   Final    BOTTLES DRAWN AEROBIC AND ANAEROBIC 1 CC ANAERO 4 CC AERO   Culture NO GROWTH 5 DAYS  Final   Report Status 02/06/2015 FINAL  Final  Urine culture     Status: None   Collection Time: 02/01/15  8:35 AM  Result Value Ref Range Status   Specimen Description URINE, CLEAN CATCH  Final   Special Requests NONE  Final   Culture NO GROWTH 1 DAY  Final   Report Status 02/02/2015 FINAL  Final  MRSA PCR Screening     Status: None   Collection Time: 02/01/15 12:50 PM  Result Value Ref Range Status   MRSA by PCR NEGATIVE NEGATIVE Final    Comment:        The GeneXpert MRSA Assay (FDA approved for NASAL specimens only), is one component of a comprehensive MRSA colonization surveillance program. It  is not intended to diagnose MRSA infection nor to guide or monitor treatment for MRSA infections.   Culture, expectorated sputum-assessment     Status: None   Collection Time: 02/02/15 12:10 PM  Result Value Ref Range Status   Specimen Description INDUCED SPUTUM  Final   Special Requests NONE  Final   Sputum evaluation THIS SPECIMEN IS ACCEPTABLE FOR SPUTUM CULTURE  Final   Report Status 02/02/2015 FINAL  Final  Culture, respiratory (NON-Expectorated)     Status: None (Preliminary result)   Collection Time: 02/02/15 12:10 PM  Result Value Ref Range Status   Specimen Description INDUCED SPUTUM  Final   Special Requests NONE Reflexed from [id number]  Final    Gram Stain   Final    EXCELLENT SPECIMEN - 90-100% WBCS MANY WBC SEEN RARE FUNGAL ELEMENT SEEN    Culture   Final    MOLD PREVIOUS MOLD SENT TO THE STATE LABORATORY FOR IDENTIFICATION LIGHT GROWTH CANDIDA SPECIES    Report Status PENDING  Incomplete  C difficile quick scan w PCR reflex     Status: None   Collection Time: 02/03/15 10:33 AM  Result Value Ref Range Status   C Diff antigen NEGATIVE NEGATIVE Final   C Diff toxin NEGATIVE NEGATIVE Final   C Diff interpretation Negative for C. difficile  Final  Blood Culture (routine x 2)     Status: None   Collection Time: 02/13/15 10:14 AM  Result Value Ref Range Status   Specimen Description BLOOD RIGHT ARM  Final   Special Requests BOTTLES DRAWN AEROBIC AND ANAEROBIC  [place]  Final   Culture NO GROWTH 5 DAYS  Final   Report Status 02/18/2015 FINAL  Final  Blood Culture (routine x 2)     Status: None   Collection Time: 02/13/15 10:15 AM  Result Value Ref Range Status   Specimen Description BLOOD RIGHT ASSIST CONTROL  Final   Special Requests BOTTLES DRAWN AEROBIC AND ANAEROBIC  4CC  Final   Culture NO GROWTH 5 DAYS  Final   Report Status 02/18/2015 FINAL  Final  Urine culture     Status: None   Collection Time: 02/13/15  1:05 PM  Result Value Ref Range Status   Specimen Description URINE, CLEAN CATCH  Final   Special Requests NONE  Final   Culture 60,000 COLONIES/ml KLEBSIELLA PNEUMONIAE  Final   Report Status 02/15/2015 FINAL  Final   Organism ID, Bacteria KLEBSIELLA PNEUMONIAE  Final      Susceptibility   Klebsiella pneumoniae - MIC*    AMPICILLIN >=32 RESISTANT Resistant     CEFAZOLIN <=4 SENSITIVE Sensitive     CEFTRIAXONE <=1 SENSITIVE Sensitive     CIPROFLOXACIN <=0.25 SENSITIVE Sensitive     GENTAMICIN <=1 SENSITIVE Sensitive     IMIPENEM <=0.25 SENSITIVE Sensitive     NITROFURANTOIN 128 RESISTANT Resistant     TRIMETH/SULFA <=20 SENSITIVE Sensitive     PIP/TAZO Value in next row Sensitive      SENSITIVE<=4    *  60,000 COLONIES/ml KLEBSIELLA PNEUMONIAE  Culture, expectorated sputum-assessment     Status: None   Collection Time: 02/17/15  4:54 PM  Result Value Ref Range Status   Specimen Description EXPECTORATED SPUTUM  Final   Special Requests Immunocompromised  Final   Sputum evaluation THIS SPECIMEN IS ACCEPTABLE FOR SPUTUM CULTURE  Final   Report Status 02/17/2015 FINAL  Final  Culture, respiratory (NON-Expectorated)     Status: None   Collection Time: 02/17/15  4:54 PM  Result Value Ref Range Status   Specimen Description EXPECTORATED SPUTUM  Final   Special Requests Immunocompromised Reflexed from [id number]  Final   Gram Stain   Final    MODERATE WBC SEEN FEW GRAM POSITIVE COCCI EXCELLENT SPECIMEN - 90-100% WBCS    Culture APPEARS TO BE NORMAL RESPIRATORY FLORA  Final   Report Status 02/20/2015 FINAL  Final    Anti-infectives    Start     Dose/Rate Route Frequency Ordered Stop   02/20/15 2200  voriconazole (VFEND) tablet 200 mg     200 mg Oral Every 12 hours 02/20/15 1406     02/18/15 2200  voriconazole (VFEND) 250 mg in sodium chloride 0.9 % 100 mL IVPB  Status:  Discontinued     4 mg/kg  61.3 kg 62.5 mL/hr over 120 Minutes Intravenous Every 12 hours 02/18/15 1854 02/20/15 1406   02/18/15 2000  vancomycin (VANCOCIN) IVPB 1000 mg/200 mL premix  Status:  Discontinued     1,000 mg 200 mL/hr over 60 Minutes Intravenous Every 12 hours 02/18/15 1854 02/19/15 1436   02/18/15 2000  piperacillin-tazobactam (ZOSYN) IVPB 3.375 g  Status:  Discontinued     3.375 g 12.5 mL/hr over 240 Minutes Intravenous Every 8 hours 02/18/15 1854 02/19/15 1436      Assessment: 59 y/o male presenting from [facility] with cavitary PNA for evaluation by CT surgery. Originally d/c on 9/29 with voriconazole but did not fill and returned 10/2. Not a surgical candidate per TCTS, ID recommending life long voriconazole.  Plan:  - Change voriconazole to 200 mg PO q12h - There is no dose adjustment for renal function -  Pharmacy signing off, please re-consult if needed  [facility], [place].D., BCPS Clinical Pharmacist Pager: [phone] 02/20/2015 2:10 PM

## 2015-02-20 NOTE — Progress Notes (Signed)
PROGRESS NOTE    Jonathan [name] XNA:[id number] DOB: 11/06/1955 DOA: 02/18/2015 PCP: No PCP Per Patient Outpatient Specialists:   Neurology: At [facility].  HPI/Brief narrative 59 y.o. male , separated, apparently lives alone, depends on elderly parents for financial and other support, history of traumatic brain injury, cognitive impairment, possible depression, tobacco and alcohol abuse, seizure disorder-unclear if related to his TBI or alcohol withdrawal, initially presented to [facility] with complaints of profound weight loss, chronic productive cough and hemoptysis. 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 He was treated with IV vancomycin, Zosyn and discharged 9/29 on voriconazole. However he never picked up Voriconazole and was readmitted to [facility] on 10/2 with septic shock which has since resolved. He was evaluated by surgery (Dr. Nestor Lewandowsky) on 02/08/15 who recommended that he will likely require thoracotomy and was transferred to [facility] on 10/7. TCTS & ID consulted.   Assessment/Plan:  Chronic Cavitary Aspergillosis/left upper lobe - Patient has been evaluated by infectious disease, CCM and surgery at [facility] - ID input appreciated: Discontinued IV vancomycin and Zosyn on 10/8 and continued voriconazole-likely lifetime. - Thoracic surgery consulted on 10/7-appreciated input and not a surgical candidate and recommended medical management. - Workup thus far: Sputum culture 10/6: Appears normal. [place]-final results pending. AFB culture 9/212 & 9/20-pending. Sputum culture 02/02/15: Mold-previous mold sent to the state lab for identification. Light growth of Candida species. Rapid HIV screen: Nonreactive. QuantiFERON TB gold: Indeterminate. Blood culture 9/20: Negative. Sputum culture 9/20: Mold-sent to state for identification  Paroxysmal Atrial fibrillation - On amiodarone. Not  anticoagulation candidate secondary to hemoptysis from lung lesion. Monitor on telemetry. Currently in sinus rhythm.  Anemia of chronic disease - Status post PRBC transfusion 1 unit at [facility]. Hemoglobin stable. Follow CBC and transfuse if hemoglobin less than 7 g per DL.  Seizure disorder - Unclear if related to his traumatic brain injury or alcohol withdrawal. Continue Topamax. Follows at [facility] neurology.  Hypokalemia - Replace as needed and follow. Magnesium normal.  Bilateral nonocclusive upper extremity DVT - Not on anticoagulation secondary to hemoptysis from lung lesion.  Non-severe (moderate) malnutrition in context of social or environmental circumstances - Continue nutritional supplements and dietitian input appreciated.  Tobacco abuse - Cessation counseled  Alcohol dependence - Continue multivitamins, folate and thiamine supplements. Likely out of window for withdrawal (has been hospitalized since 01/14/15). However monitor closely.  Chronic tremors - ? Benign essential tremors versus related to alcohol dependence.  History of traumatic brain injury and cognitive impairment  Adult failure to thrive  Depression - Continue Celexa   DVT prophylaxis: SCD's Code Status: Full  Family Communication: None at bedside  Disposition Plan: Possible DC to SNF when bed available,? 10/10    Consultants:  Thoracic Surgery  Infectious disease  Procedures:  None  Antibiotics:  IV Vancomycin 10/7> 10/8  IV Zosyn 10/7> 10/8  IV Voriconazole 10/7>   Subjective: Keeps asking same questions that have already been answered-"I don't know what's happening". As per nursing, no acute issues.  Objective: Filed Vitals:   02/19/15 0557 02/19/15 0928 02/19/15 2025 02/20/15 0430  BP: 115/64 108/52 108/65 110/60  Pulse: 83 80 76 78  Temp: 98 F (36.7 C) 98.5 F (36.9 C) 97.9 F (36.6 C) 98 F (36.7 C)  TempSrc: Oral Axillary Oral Oral  Resp:    19 19  Height:      Weight: 61 kg (  134 lb 7.7 oz)   60.737 kg (133 lb 14.4 oz)  SpO2: 93% 96% 99% 99%    Intake/Output Summary (Last 24 hours) at 02/20/15 0955 Last data filed at 02/20/15 0910  Gross per 24 hour  Intake   1815 ml  Output   1253 ml  Net    562 ml   Filed Weights   02/18/15 1644 02/19/15 0557 02/20/15 0430  Weight: 61.3 kg (135 lb 2.3 oz) 61 kg (134 lb 7.7 oz) 60.737 kg (133 lb 14.4 oz)     Exam:  General exam: Moderately built and poorly nourished middle-aged male patient sitting up comfortably in bed eating breakfast this morning. Respiratory system: Harsh breath sounds, particularly in the bases with scattered crackles in the left base. No increased work of breathing. Cardiovascular system: S1 & S2 heard, RRR. No JVD, murmurs, gallops, clicks. Trace bilateral ankle edema. Telemetry: Sinus rhythm. Gastrointestinal system: Abdomen is nondistended, soft and nontender. Normal bowel sounds heard. Central nervous system: Alert and oriented 2.. No focal neurological deficits. Extremities: Symmetric 5 x 5 power.   Data Reviewed: Basic Metabolic Panel:  Recent Labs Lab 02/16/15 0506 02/18/15 0559 02/18/15 1937 02/19/15 0418 02/20/15 0548  NA  --  143 138 144 142  K  --  3.0* 3.3* 3.3* 3.6  CL  --  112* 106 108 107  CO2  --  24 25 27 26   GLUCOSE  --  87 90 88 87  BUN  --  9 10 10 8   CREATININE 0.67 0.73 0.69 0.73 0.65  CALCIUM  --  8.0* 7.9* 8.2* 8.2*  MG  --  1.8 2.0  --   --    Liver Function Tests:  Recent Labs Lab 02/20/15 0548  AST 14*  ALT 10*  ALKPHOS 55  BILITOT 0.2*  PROT 5.4*  ALBUMIN 1.6*   No results for input(s): LIPASE, AMYLASE in the last 168 hours. No results for input(s): AMMONIA in the last 168 hours. CBC:  Recent Labs Lab 02/16/15 0506 02/17/15 0431 02/18/15 0559 02/19/15 0418 02/20/15 0548  WBC 14.7* 15.2* 10.8* 13.0* 11.8*  HGB 6.7* 8.7* 8.5* 8.1* 8.1*  HCT 21.4* 26.5* 25.1* 24.9* 25.4*  MCV 93.7 89.5 91.1 92.9  94.4  PLT 720* 729* 674* 650* 620*   Cardiac Enzymes: No results for input(s): CKTOTAL, CKMB, CKMBINDEX, TROPONINI in the last 168 hours. BNP (last 3 results) No results for input(s): PROBNP in the last 8760 hours. CBG: No results for input(s): GLUCAP in the last 168 hours.  Recent Results (from the past 240 hour(s))  Blood Culture (routine x 2)     Status: None   Collection Time: 02/13/15 10:14 AM  Result Value Ref Range Status   Specimen Description BLOOD RIGHT ARM  Final   Special Requests BOTTLES DRAWN AEROBIC AND ANAEROBIC  [place]  Final   Culture NO GROWTH 5 DAYS  Final   Report Status 02/18/2015 FINAL  Final  Blood Culture (routine x 2)     Status: None   Collection Time: 02/13/15 10:15 AM  Result Value Ref Range Status   Specimen Description BLOOD RIGHT ASSIST CONTROL  Final   Special Requests BOTTLES DRAWN AEROBIC AND ANAEROBIC  4CC  Final   Culture NO GROWTH 5 DAYS  Final   Report Status 02/18/2015 FINAL  Final  Urine culture     Status: None   Collection Time: 02/13/15  1:05 PM  Result Value Ref Range Status   Specimen Description URINE, CLEAN  CATCH  Final   Special Requests NONE  Final   Culture 60,000 COLONIES/ml KLEBSIELLA PNEUMONIAE  Final   Report Status 02/15/2015 FINAL  Final   Organism ID, Bacteria KLEBSIELLA PNEUMONIAE  Final      Susceptibility   Klebsiella pneumoniae - MIC*    AMPICILLIN >=32 RESISTANT Resistant     CEFAZOLIN <=4 SENSITIVE Sensitive     CEFTRIAXONE <=1 SENSITIVE Sensitive     CIPROFLOXACIN <=0.25 SENSITIVE Sensitive     GENTAMICIN <=1 SENSITIVE Sensitive     IMIPENEM <=0.25 SENSITIVE Sensitive     NITROFURANTOIN 128 RESISTANT Resistant     TRIMETH/SULFA <=20 SENSITIVE Sensitive     PIP/TAZO Value in next row Sensitive      SENSITIVE<=4    * 60,000 COLONIES/ml KLEBSIELLA PNEUMONIAE  Culture, expectorated sputum-assessment     Status: None   Collection Time: 02/17/15  4:54 PM  Result Value Ref Range Status   Specimen Description  EXPECTORATED SPUTUM  Final   Special Requests Immunocompromised  Final   Sputum evaluation THIS SPECIMEN IS ACCEPTABLE FOR SPUTUM CULTURE  Final   Report Status 02/17/2015 FINAL  Final  Culture, respiratory (NON-Expectorated)     Status: None (Preliminary result)   Collection Time: 02/17/15  4:54 PM  Result Value Ref Range Status   Specimen Description EXPECTORATED SPUTUM  Final   Special Requests Immunocompromised Reflexed from [id number]  Final   Gram Stain   Final    MODERATE WBC SEEN FEW GRAM POSITIVE COCCI EXCELLENT SPECIMEN - 90-100% WBCS    Culture APPEARS TO BE NORMAL RESPIRATORY FLORA  Final   Report Status PENDING  Incomplete           Studies: No results found.      Scheduled Meds: . amiodarone  200 mg Oral Daily  . citalopram  20 mg Oral BH-q7a  . feeding supplement (ENSURE ENLIVE)  237 mL Oral BID BM  . folic acid  1 mg Oral Daily  . guaiFENesin  1,200 mg Oral BID  . mirtazapine  15 mg Oral QHS  . multivitamin with minerals  1 tablet Oral Daily  . primidone  200 mg Oral QHS  . sodium chloride  3 mL Intravenous Q12H  . thiamine  100 mg Oral Daily  . topiramate  25 mg Oral BID  . voriconazole  4 mg/kg Intravenous Q12H   Continuous Infusions: . 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 Active Problems:   Benign essential tremor   Protein-calorie malnutrition, severe (HCC)   Dementia   Tobacco abuse   Alcohol dependence (HCC)   History of traumatic brain injury-with cognitive impairment.   Seizure disorder (HCC)   Hypokalemia   Anemia of chronic disease   Thrombocytosis (HCC)   Failure to thrive in adult   Pulmonary aspergilloma (HCC)   Malnutrition of moderate degree    Time spent: 25 minutes    HONGALGI,ANAND, MD, FACP, FHM. [facility] Pager [phone]  If 7PM-7AM, please contact night-coverage [email or web address] Password [email or web address] 02/20/2015, 9:55  AM    LOS: 2 days

## 2015-02-20 NOTE — Progress Notes (Signed)
Flu shot ordered. Verified with Dr. Linus Salmons, Infectious Disease and he okayed it to proceed with administration.

## 2015-02-21 ENCOUNTER — Ambulatory Visit: Payer: Self-pay

## 2015-02-21 NOTE — Progress Notes (Signed)
PROGRESS NOTE    Jonathan [name] OMB:[id number] DOB: 1956/03/24 DOA: 02/18/2015 PCP: No PCP Per Patient Outpatient Specialists:   Neurology: At [facility].  HPI/Brief narrative 59 y.o. male , separated, apparently lives alone, depends on elderly parents for financial and other support, history of traumatic brain injury, cognitive impairment, possible depression, tobacco and alcohol abuse, seizure disorder-unclear if related to his TBI or alcohol withdrawal, initially presented to the [facility] with complaints of profound weight loss, chronic productive cough and hemoptysis. 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 He was treated with IV vancomycin, Zosyn and discharged 9/29 on voriconazole. However he never picked up Voriconazole and was readmitted to [facility] on 10/2 with septic shock which has since resolved. He was evaluated by surgery (Dr. Nestor Lewandowsky) on 02/08/15 who recommended that he will likely require thoracotomy and was transferred to [facility] on 10/7. TCTS & ID consulted. Thoracic surgery indicated that patient was not a candidate for surgery and recommended continued medical treatment with antifungals.   Assessment/Plan:  Chronic Cavitary Aspergillosis/left upper lobe - Patient has been evaluated by infectious disease, CCM and surgery at [facility] - ID input appreciated: Discontinued IV vancomycin and Zosyn on 10/8 and continued voriconazole-likely lifetime. - Thoracic surgery consulted on 10/7-appreciated input and not a surgical candidate and recommended medical management. - Workup thus far: Sputum culture 10/6: Appears normal. Flora-final results pending. AFB culture 9/212 & 9/20-pending. Sputum culture 02/02/15: Mold-previous mold sent to the state lab for identification. Light growth of Candida species. Rapid HIV screen: Nonreactive. QuantiFERON TB gold: Indeterminate. Blood  culture 9/20: Negative. Sputum culture 9/20: Mold-sent to state for identification  Paroxysmal Atrial fibrillation - On amiodarone. Not anticoagulation candidate secondary to hemoptysis from lung lesion. Currently in sinus rhythm.  Anemia of chronic disease - Status post PRBC transfusion 1 unit at [facility]. Hemoglobin stable. Follow CBC periodically and transfuse if hemoglobin less than 7 g per DL.  Seizure disorder - Unclear if related to his traumatic brain injury or alcohol withdrawal. Continue Topamax. Follows at [facility] neurology.  Hypokalemia - Replace as needed and follow. Magnesium normal.  Bilateral nonocclusive upper extremity DVT - Not on anticoagulation secondary to hemoptysis from lung lesion.  Non-severe (moderate) malnutrition in context of social or environmental circumstances - Continue nutritional supplements and dietitian input appreciated.  Tobacco abuse - Cessation counseled  Alcohol dependence - Continue multivitamins, folate and thiamine supplements. Likely out of window for withdrawal (has been hospitalized since 01/14/15). However monitor closely.  Chronic tremors - ? Benign essential tremors versus related to alcohol dependence.  History of traumatic brain injury and cognitive impairment  Adult failure to thrive  Depression - Continue Celexa   DVT prophylaxis: SCD's Code Status: Full  Family Communication: Discussed with patient's father on 02/21/15: Updated care and answered questions. He informed me that he is unable to take care of patient and that he is already taking care of his own wife who is "invalid". Disposition Plan: Medically stable for DC to SNF when bed available,? 10/10    Consultants:  Thoracic Surgery  Infectious disease  Procedures:  None  Antibiotics:  IV Vancomycin 10/7> 10/8  IV Zosyn 10/7> 10/8  IV Voriconazole 10/7>   Subjective: States that the condom catheter is causing  discomfort-requested RN to remove. No new complaints.  Objective: Filed Vitals:   02/20/15 1005 02/20/15 1236 02/20/15 2053 02/21/15 0557  BP: 99/57 100/61 116/63 108/58  Pulse: 78 82 81 74  Temp: 98.4 F (36.9 C) 98.5 F (36.9 C) 98 F (36.7 C) 98.4 F (36.9 C)  TempSrc: Oral Oral Oral Oral  Resp: 18 20 20 20   Height:      Weight:    55.248 kg (121 lb 12.8 oz)  SpO2: 96% 99% 97% 96%    Intake/Output Summary (Last 24 hours) at 02/21/15 0916 Last data filed at 02/21/15 0647  Gross per 24 hour  Intake    840 ml  Output   1100 ml  Net   -260 ml   Filed Weights   02/19/15 0557 02/20/15 0430 02/21/15 0557  Weight: 61 kg (134 lb 7.7 oz) 60.737 kg (133 lb 14.4 oz) 55.248 kg (121 lb 12.8 oz)     Exam:  General exam: Moderately built and poorly nourished middle-aged male patient sitting up comfortably in bed. Respiratory system: Harsh breath sounds, particularly in the bases with scattered crackles in the left base. No increased work of breathing. Cardiovascular system: S1 & S2 heard, RRR. No JVD, murmurs, gallops, clicks. Trace bilateral ankle edema.  Gastrointestinal system: Abdomen is nondistended, soft and nontender. Normal bowel sounds heard. Central nervous system: Alert and oriented 2.. No focal neurological deficits. Extremities: Symmetric 5 x 5 power.   Data Reviewed: Basic Metabolic Panel:  Recent Labs Lab 02/16/15 0506 02/18/15 0559 02/18/15 1937 02/19/15 0418 02/20/15 0548  NA  --  143 138 144 142  K  --  3.0* 3.3* 3.3* 3.6  CL  --  112* 106 108 107  CO2  --  24 25 27 26   GLUCOSE  --  87 90 88 87  BUN  --  9 10 10 8   CREATININE 0.67 0.73 0.69 0.73 0.65  CALCIUM  --  8.0* 7.9* 8.2* 8.2*  MG  --  1.8 2.0  --   --    Liver Function Tests:  Recent Labs Lab 02/20/15 0548  AST 14*  ALT 10*  ALKPHOS 55  BILITOT 0.2*  PROT 5.4*  ALBUMIN 1.6*   No results for input(s): LIPASE, AMYLASE in the last 168 hours. No results for input(s): AMMONIA in the  last 168 hours. CBC:  Recent Labs Lab 02/16/15 0506 02/17/15 0431 02/18/15 0559 02/19/15 0418 02/20/15 0548  WBC 14.7* 15.2* 10.8* 13.0* 11.8*  HGB 6.7* 8.7* 8.5* 8.1* 8.1*  HCT 21.4* 26.5* 25.1* 24.9* 25.4*  MCV 93.7 89.5 91.1 92.9 94.4  PLT 720* 729* 674* 650* 620*   Cardiac Enzymes: No results for input(s): CKTOTAL, CKMB, CKMBINDEX, TROPONINI in the last 168 hours. BNP (last 3 results) No results for input(s): PROBNP in the last 8760 hours. CBG: No results for input(s): GLUCAP in the last 168 hours.  Recent Results (from the past 240 hour(s))  Blood Culture (routine x 2)     Status: None   Collection Time: 02/13/15 10:14 AM  Result Value Ref Range Status   Specimen Description BLOOD RIGHT ARM  Final   Special Requests BOTTLES DRAWN AEROBIC AND ANAEROBIC  [place]  Final   Culture NO GROWTH 5 DAYS  Final   Report Status 02/18/2015 FINAL  Final  Blood Culture (routine x 2)     Status: None   Collection Time: 02/13/15 10:15 AM  Result Value Ref Range Status   Specimen Description BLOOD RIGHT ASSIST CONTROL  Final   Special Requests BOTTLES DRAWN AEROBIC AND ANAEROBIC  4CC  Final   Culture NO GROWTH 5 DAYS  Final   Report  Status 02/18/2015 FINAL  Final  Urine culture     Status: None   Collection Time: 02/13/15  1:05 PM  Result Value Ref Range Status   Specimen Description URINE, CLEAN CATCH  Final   Special Requests NONE  Final   Culture 60,000 COLONIES/ml KLEBSIELLA PNEUMONIAE  Final   Report Status 02/15/2015 FINAL  Final   Organism ID, Bacteria KLEBSIELLA PNEUMONIAE  Final      Susceptibility   Klebsiella pneumoniae - MIC*    AMPICILLIN >=32 RESISTANT Resistant     CEFAZOLIN <=4 SENSITIVE Sensitive     CEFTRIAXONE <=1 SENSITIVE Sensitive     CIPROFLOXACIN <=0.25 SENSITIVE Sensitive     GENTAMICIN <=1 SENSITIVE Sensitive     IMIPENEM <=0.25 SENSITIVE Sensitive     NITROFURANTOIN 128 RESISTANT Resistant     TRIMETH/SULFA <=20 SENSITIVE Sensitive     PIP/TAZO Value  in next row Sensitive      SENSITIVE<=4    * 60,000 COLONIES/ml KLEBSIELLA PNEUMONIAE  Culture, expectorated sputum-assessment     Status: None   Collection Time: 02/17/15  4:54 PM  Result Value Ref Range Status   Specimen Description EXPECTORATED SPUTUM  Final   Special Requests Immunocompromised  Final   Sputum evaluation THIS SPECIMEN IS ACCEPTABLE FOR SPUTUM CULTURE  Final   Report Status 02/17/2015 FINAL  Final  Culture, respiratory (NON-Expectorated)     Status: None   Collection Time: 02/17/15  4:54 PM  Result Value Ref Range Status   Specimen Description EXPECTORATED SPUTUM  Final   Special Requests Immunocompromised Reflexed from [id number]  Final   Gram Stain   Final    MODERATE WBC SEEN FEW GRAM POSITIVE COCCI EXCELLENT SPECIMEN - 90-100% WBCS    Culture APPEARS TO BE NORMAL RESPIRATORY FLORA  Final   Report Status 02/20/2015 FINAL  Final           Studies: No results found.      Scheduled Meds: . amiodarone  200 mg Oral Daily  . citalopram  20 mg Oral BH-q7a  . feeding supplement (ENSURE ENLIVE)  237 mL Oral BID BM  . folic acid  1 mg Oral Daily  . guaiFENesin  1,200 mg Oral BID  . Influenza vac split quadrivalent PF  0.5 mL Intramuscular Tomorrow-1000  . mirtazapine  15 mg Oral QHS  . multivitamin with minerals  1 tablet Oral Daily  . primidone  200 mg Oral QHS  . sodium chloride  3 mL Intravenous Q12H  . thiamine  100 mg Oral Daily  . topiramate  25 mg Oral BID  . voriconazole  200 mg Oral Q12H   Continuous Infusions:    Principal Problem:   Pulmonary aspergillosis (HCC)-chronic cavitatory aspergillosis. Active Problems:   Benign essential tremor   Protein-calorie malnutrition, severe (HCC)   Dementia   Tobacco abuse   Alcohol dependence (HCC)   History of traumatic brain injury-with cognitive impairment.   Seizure disorder (HCC)   Hypokalemia   Anemia of chronic disease   Thrombocytosis (HCC)   Failure to thrive in adult   Pulmonary  aspergilloma (HCC)   Malnutrition of moderate degree    Time spent: 25 minutes    HONGALGI,ANAND, MD, FACP, FHM. [facility] Pager [phone]  If 7PM-7AM, please contact night-coverage [email or web address] Password [email or web address] 02/21/2015, 9:16 AM    LOS: 3 days

## 2015-02-21 NOTE — Progress Notes (Signed)
Physical Therapy Treatment Patient Details Name: Jonathan [name] MRN: [id number] DOB: [birth year] Today's Date: 02/21/2015    History of Present Illness Patient is a 59 y.o. male with history of traumatic brain injury, cognitive impairment, possible depression, tobacco and alcohol abuse, seizure disorder-unclear if related to his TBI or alcohol withdrawal, initially presented to the [facility] with complaints of profound weight loss, chronic productive cough and hemoptysis. transferred to [facility] for evaluation by cardiothoracic surgery.    PT Comments    Pt able to increase ambulation with RW, but shaky throughout and pt stating he normallly walked better.  Con't to recommend SNF.  Follow Up Recommendations  SNF;Supervision/Assistance - 24 hour     Equipment Recommendations  Rolling walker with 5" wheels    Recommendations for Other Services       Precautions / Restrictions Precautions Precautions: Fall Precaution Comments: decreased cognition Restrictions Weight Bearing Restrictions: No    Mobility  Bed Mobility Overal bed mobility: Needs Assistance Bed Mobility: Supine to Sit;Sit to Supine     Supine to sit: Supervision Sit to supine: Supervision   General bed mobility comments: raised HOB  Transfers Overall transfer level: Needs assistance Equipment used: Rolling walker (2 wheeled) Transfers: Sit to/from Stand Sit to Stand: Min guard         General transfer comment: cues for hand placement  Ambulation/Gait Ambulation/Gait assistance: Min guard Ambulation Distance (Feet): 30 Feet Assistive device: Rolling walker (2 wheeled) Gait Pattern/deviations: Step-through pattern   Gait velocity interpretation: Below normal speed for age/gender General Gait Details: Unsteadiness noted throughout gait with decreased speed.     Stairs            Wheelchair Mobility    Modified Rankin (Stroke Patients Only)        Balance   Sitting-balance support: Feet supported;No upper extremity supported Sitting balance-Leahy Scale: Good       Standing balance-Leahy Scale: Fair                      Cognition Arousal/Alertness: Awake/alert Behavior During Therapy: WFL for tasks assessed/performed;Flat affect Overall Cognitive Status: History of cognitive impairments - at baseline                      Exercises      General Comments General comments (skin integrity, edema, etc.): Coughing up sputum throughout session.      Pertinent Vitals/Pain Pain Assessment: Faces Faces Pain Scale: Hurts even more Pain Location: front of legs when catheter tubing was placed across Pain Descriptors / Indicators: Moaning;Grimacing Pain Intervention(s): Repositioned    Home Living                      Prior Function            PT Goals (current goals can now be found in the care plan section) Acute Rehab PT Goals Patient Stated Goal: not stated PT Goal Formulation: With patient Time For Goal Achievement: 03/05/15 Potential to Achieve Goals: Good Progress towards PT goals: Progressing toward goals    Frequency  Min 2X/week    PT Plan Current plan remains appropriate    Co-evaluation             End of Session Equipment Utilized During Treatment: Gait belt Activity Tolerance: Patient limited by fatigue Patient left: in bed;with call bell/phone within reach;with bed alarm set     Time: 5093-2671 PT  Time Calculation (min) (ACUTE ONLY): 14 min  Charges:  $Gait Training: 8-22 mins                    G Codes:      Jonathan [name] 02/21/2015, 2:09 PM

## 2015-02-22 NOTE — Progress Notes (Signed)
PROGRESS NOTE    Jonathan [name] WYO:[id number] DOB: 1956/02/24 DOA: 02/18/2015 PCP: No PCP Per Patient Outpatient Specialists:   Neurology: At [facility].  HPI/Brief narrative 59 y.o. male , separated, apparently lives alone, depends on elderly parents for financial and other support, history of traumatic brain injury, cognitive impairment, possible depression, tobacco and alcohol abuse, seizure disorder-unclear if related to his TBI or alcohol withdrawal, initially presented to the [facility] with complaints of profound weight loss, chronic productive cough and hemoptysis. 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 He was treated with IV vancomycin, Zosyn and discharged 9/29 on voriconazole. However he never picked up Voriconazole and was readmitted to [facility] on 10/2 with septic shock which has since resolved. He was evaluated by surgery (Dr. Nestor Lewandowsky) on 02/08/15 who recommended that he will likely require thoracotomy and was transferred to [facility] on 10/7. TCTS & ID consulted. Thoracic surgery indicated that patient was not a candidate for surgery and recommended continued medical treatment with antifungals.   Assessment/Plan:  Chronic Cavitary Aspergillosis/left upper lobe - Patient has been evaluated by infectious disease, CCM and surgery at [facility] - ID input appreciated: Discontinued IV vancomycin and Zosyn on 10/8 and continued voriconazole-likely lifetime. - Thoracic surgery consulted on 10/7-appreciated input and not a surgical candidate and recommended medical management. - Workup thus far: Sputum culture 10/6: Appears normal. Flora-final results pending. AFB culture 9/212 & 9/20-pending. Sputum culture 02/02/15: Mold-previous mold sent to the state lab for identification. Light growth of Candida species. Rapid HIV screen: Nonreactive. QuantiFERON TB gold: Indeterminate. Blood  culture 9/20: Negative. Sputum culture 9/20: Mold-sent to state for identification  Paroxysmal Atrial fibrillation - On amiodarone. Not anticoagulation candidate secondary to hemoptysis from lung lesion. Currently in sinus rhythm.  Anemia of chronic disease - Status post PRBC transfusion 1 unit at [facility]. Hemoglobin stable. Follow CBC periodically and transfuse if hemoglobin less than 7 g per DL.  Seizure disorder - Unclear if related to his traumatic brain injury or alcohol withdrawal. Continue Topamax. Follows at [facility] neurology.  Hypokalemia - Replace as needed and follow. Magnesium normal.  Bilateral nonocclusive upper extremity DVT - Not on anticoagulation secondary to hemoptysis from lung lesion.  Non-severe (moderate) malnutrition in context of social or environmental circumstances - Continue nutritional supplements and dietitian input appreciated.  Tobacco abuse - Cessation counseled  Alcohol dependence - Continue multivitamins, folate and thiamine supplements. Likely out of window for withdrawal (has been hospitalized since 01/14/15). However monitor closely.  Chronic tremors - ? Benign essential tremors versus related to alcohol dependence.  History of traumatic brain injury and cognitive impairment  Adult failure to thrive  Depression - Continue Celexa  Thrombocytosis - Likely reactive.    DVT prophylaxis: SCD's Code Status: Full  Family Communication: Discussed with patient's father on 02/21/15: Updated care and answered questions. He informed me that he is unable to take care of patient and that he is already taking care of his own wife who is "invalid". Disposition Plan: Medically stable for DC to SNF when bed available. Discussed with case management and clinical Education officer, museum.    Consultants:  Thoracic Surgery  Infectious disease  Procedures:  None  Antibiotics:  IV Vancomycin 10/7> 10/8  IV Zosyn 10/7> 10/8  IV  Voriconazole 10/7>   Subjective: Denies complaints.  Objective: Filed Vitals:   02/21/15 1202 02/21/15 2055 02/22/15 0538 02/22/15 1108  BP: 101/54 106/57 104/61  107/57  Pulse: 78 73 66 65  Temp: 98.5 F (36.9 C) 98.1 F (36.7 C) 97.1 F (36.2 C) 97.6 F (36.4 C)  TempSrc: Oral Oral Oral Oral  Resp: 18 18 17 20   Height:      Weight:   53.933 kg (118 lb 14.4 oz)   SpO2: 99% 100% 100% 100%    Intake/Output Summary (Last 24 hours) at 02/22/15 1511 Last data filed at 02/22/15 1340  Gross per 24 hour  Intake   1320 ml  Output   1400 ml  Net    -80 ml   Filed Weights   02/20/15 0430 02/21/15 0557 02/22/15 0538  Weight: 60.737 kg (133 lb 14.4 oz) 55.248 kg (121 lb 12.8 oz) 53.933 kg (118 lb 14.4 oz)     Exam:  General exam: Moderately built and poorly nourished middle-aged male patient sitting up comfortably in bed. Respiratory system: Harsh breath sounds, particularly in the bases with scattered crackles in the left base. No increased work of breathing. Cardiovascular system: S1 & S2 heard, RRR. No JVD, murmurs, gallops, clicks. Trace bilateral ankle edema.  Gastrointestinal system: Abdomen is nondistended, soft and nontender. Normal bowel sounds heard. Central nervous system: Alert and oriented 2.. No focal neurological deficits. Extremities: Symmetric 5 x 5 power.   Data Reviewed: Basic Metabolic Panel:  Recent Labs Lab 02/16/15 0506 02/18/15 0559 02/18/15 1937 02/19/15 0418 02/20/15 0548  NA  --  143 138 144 142  K  --  3.0* 3.3* 3.3* 3.6  CL  --  112* 106 108 107  CO2  --  24 25 27 26   GLUCOSE  --  87 90 88 87  BUN  --  9 10 10 8   CREATININE 0.67 0.73 0.69 0.73 0.65  CALCIUM  --  8.0* 7.9* 8.2* 8.2*  MG  --  1.8 2.0  --   --    Liver Function Tests:  Recent Labs Lab 02/20/15 0548  AST 14*  ALT 10*  ALKPHOS 55  BILITOT 0.2*  PROT 5.4*  ALBUMIN 1.6*   No results for input(s): LIPASE, AMYLASE in the last 168 hours. No results for input(s):  AMMONIA in the last 168 hours. CBC:  Recent Labs Lab 02/16/15 0506 02/17/15 0431 02/18/15 0559 02/19/15 0418 02/20/15 0548  WBC 14.7* 15.2* 10.8* 13.0* 11.8*  HGB 6.7* 8.7* 8.5* 8.1* 8.1*  HCT 21.4* 26.5* 25.1* 24.9* 25.4*  MCV 93.7 89.5 91.1 92.9 94.4  PLT 720* 729* 674* 650* 620*   Cardiac Enzymes: No results for input(s): CKTOTAL, CKMB, CKMBINDEX, TROPONINI in the last 168 hours. BNP (last 3 results) No results for input(s): PROBNP in the last 8760 hours. CBG: No results for input(s): GLUCAP in the last 168 hours.  Recent Results (from the past 240 hour(s))  Blood Culture (routine x 2)     Status: None   Collection Time: 02/13/15 10:14 AM  Result Value Ref Range Status   Specimen Description BLOOD RIGHT ARM  Final   Special Requests BOTTLES DRAWN AEROBIC AND ANAEROBIC  [place]  Final   Culture NO GROWTH 5 DAYS  Final   Report Status 02/18/2015 FINAL  Final  Blood Culture (routine x 2)     Status: None   Collection Time: 02/13/15 10:15 AM  Result Value Ref Range Status   Specimen Description BLOOD RIGHT ASSIST CONTROL  Final   Special Requests BOTTLES DRAWN AEROBIC AND ANAEROBIC  4CC  Final   Culture NO GROWTH 5 DAYS  Final  Report Status 02/18/2015 FINAL  Final  Urine culture     Status: None   Collection Time: 02/13/15  1:05 PM  Result Value Ref Range Status   Specimen Description URINE, CLEAN CATCH  Final   Special Requests NONE  Final   Culture 60,000 COLONIES/ml KLEBSIELLA PNEUMONIAE  Final   Report Status 02/15/2015 FINAL  Final   Organism ID, Bacteria KLEBSIELLA PNEUMONIAE  Final      Susceptibility   Klebsiella pneumoniae - MIC*    AMPICILLIN >=32 RESISTANT Resistant     CEFAZOLIN <=4 SENSITIVE Sensitive     CEFTRIAXONE <=1 SENSITIVE Sensitive     CIPROFLOXACIN <=0.25 SENSITIVE Sensitive     GENTAMICIN <=1 SENSITIVE Sensitive     IMIPENEM <=0.25 SENSITIVE Sensitive     NITROFURANTOIN 128 RESISTANT Resistant     TRIMETH/SULFA <=20 SENSITIVE Sensitive      PIP/TAZO Value in next row Sensitive      SENSITIVE<=4    * 60,000 COLONIES/ml KLEBSIELLA PNEUMONIAE  Culture, expectorated sputum-assessment     Status: None   Collection Time: 02/17/15  4:54 PM  Result Value Ref Range Status   Specimen Description EXPECTORATED SPUTUM  Final   Special Requests Immunocompromised  Final   Sputum evaluation THIS SPECIMEN IS ACCEPTABLE FOR SPUTUM CULTURE  Final   Report Status 02/17/2015 FINAL  Final  Culture, respiratory (NON-Expectorated)     Status: None   Collection Time: 02/17/15  4:54 PM  Result Value Ref Range Status   Specimen Description EXPECTORATED SPUTUM  Final   Special Requests Immunocompromised Reflexed from [id number]  Final   Gram Stain   Final    MODERATE WBC SEEN FEW GRAM POSITIVE COCCI EXCELLENT SPECIMEN - 90-100% WBCS    Culture APPEARS TO BE NORMAL RESPIRATORY FLORA  Final   Report Status 02/20/2015 FINAL  Final           Studies: No results found.      Scheduled Meds: . amiodarone  200 mg Oral Daily  . citalopram  20 mg Oral BH-q7a  . feeding supplement (ENSURE ENLIVE)  237 mL Oral BID BM  . folic acid  1 mg Oral Daily  . guaiFENesin  1,200 mg Oral BID  . Influenza vac split quadrivalent PF  0.5 mL Intramuscular Tomorrow-1000  . mirtazapine  15 mg Oral QHS  . multivitamin with minerals  1 tablet Oral Daily  . primidone  200 mg Oral QHS  . sodium chloride  3 mL Intravenous Q12H  . thiamine  100 mg Oral Daily  . topiramate  25 mg Oral BID  . voriconazole  200 mg Oral Q12H   Continuous Infusions:    Principal Problem:   Pulmonary aspergillosis (HCC)-chronic cavitatory aspergillosis. Active Problems:   Benign essential tremor   Protein-calorie malnutrition, severe (HCC)   Dementia   Tobacco abuse   Alcohol dependence (HCC)   History of traumatic brain injury-with cognitive impairment.   Seizure disorder (HCC)   Hypokalemia   Anemia of chronic disease   Thrombocytosis (HCC)   Failure to thrive in adult    Pulmonary aspergilloma (HCC)   Malnutrition of moderate degree    Time spent: 25 minutes    Sallie Staron, MD, FACP, FHM. [facility] Pager [phone]  If 7PM-7AM, please contact night-coverage [email or web address] Password [email or web address] 02/22/2015, 3:11 PM    LOS: 4 days

## 2015-02-22 NOTE — Progress Notes (Signed)
Patient was transferred from [facility] to [facility] and CSW has been following patient with attempt to secure placement.  Active bed search had been initiated with no success in bed offers and patient is medically stable for d/c. Barriers to d/c include lack of insurance coverage and patient is on an extremely costly medication that he will continue to require at the facility (approximately $6,000,no insurance, patient cannot sign himself into the facility and father will not sign him in.  CSW spoke with patient's father Jonathan [name]. He stated that patient lived alone prior to admission to the hospital and "took care of himself."  Patient does not have any financial means of support- his father had been buying food for him and his house was given to him when his grandparents passed away.Patient is now alert and oriented to person only and is unable to actively participate in his plan of care.  CSW spoke with his father Jonathan [name] who indicated that he was paying patient's light bill and providing him with food as much as he could but he cannot and will not be able to be his caregiver or sign him into a nursing center. He is caring for his wife who is an invalid and states "we are out of money and I can barely help the 2 of [place].".  CSW discussed patient's situation with Surveyor, quantity of [place] and the Medical Director  -Dr. Reynaldo Minium. Currently we are searching for a Difficult To Place bed for patient. CSW left a message for [facility] to try to ascertain status of patient's Medicaid/disability status.  Also will attempt to reach APS worker for [facility] re: above situation.  Lorie Phenix. Pauline Good, [place]

## 2015-02-22 NOTE — Progress Notes (Signed)
md called and said that the patient was complaining about pain related to the condom catheter.  Md stated to d/c condom cath. First we tried to use a bigger condom cath due to patient being incontinent. Pt has not complained anymore of pain

## 2015-02-22 NOTE — Progress Notes (Signed)
Occupational Therapy Treatment Patient Details Name: THAER MIYOSHI MRN: [id number] DOB: 1955/07/04 Today's Date: 02/22/2015    History of present illness Patient is a 59 y.o. male with history of traumatic brain injury, cognitive impairment, possible depression, tobacco and alcohol abuse, seizure disorder-unclear if related to his TBI or alcohol withdrawal, initially presented to the [facility] with complaints of profound weight loss, chronic productive cough and hemoptysis. transferred to [facility] for evaluation by cardiothoracic surgery.   OT comments  Patient progressing towards OT goals, continue plan of care for now. Pt on RA during session and sats remained greater than 96% prior to, during, and post activity. Pt engaged in bed mobility, stood to use urinal, then ambulated into BR for toileting needs. Pt then stood at sink for grooming task of washing hands. During session, pt required max multimodal cueing for initiation, problem solving, and safety.    Follow Up Recommendations  SNF;Supervision/Assistance - 24 hour    Equipment Recommendations  3 in 1 bedside comode;Other (comment) (TBD)    Recommendations for Other Services  None at this time  Precautions / Restrictions Precautions Precautions: Fall Precaution Comments: decreased cognition Restrictions Weight Bearing Restrictions: No    Mobility Bed Mobility Overal bed mobility: Needs Assistance Bed Mobility: Supine to Sit;Sit to Supine     Supine to sit: Supervision Sit to supine: Supervision   General bed mobility comments: Multiple verbal cues for intiation  Transfers Overall transfer level: Needs assistance Equipment used: Rolling walker (2 wheeled) Transfers: Sit to/from Stand Sit to Stand: Min guard General transfer comment: cues for hand placement    Balance Overall balance assessment: Needs assistance Sitting-balance support: No upper extremity supported;Feet  supported Sitting balance-Leahy Scale: Good     Standing balance support: Bilateral upper extremity supported;During functional activity Standing balance-Leahy Scale: Fair   ADL Overall ADL's : Needs assistance/impaired Toilet Transfer: Min guard;RW;Comfort height toilet;Ambulation;Grab bars   Toileting- Clothing Manipulation and Hygiene: Supervision/safety;Sitting/lateral lean;Cueing for safety       Functional mobility during ADLs: Min guard;Rolling walker General ADL Comments: Pt limited by decreased cognition. Pt required multiple cues for initiation and problem solving.      Cognition   Behavior During Therapy: WFL for tasks assessed/performed;Flat affect Overall Cognitive Status: History of cognitive impairments - at baseline (poor initiation and problem solving)       Memory: Decreased short-term memory                  Pertinent Vitals/ Pain       Pain Assessment: Faces Faces Pain Scale: No hurt   Frequency Min 2X/week     Progress Toward Goals  OT Goals(current goals can now befound in the care plan section)  Progress towards OT goals: Progressing toward goals     Plan Discharge plan remains appropriate    End of Session Equipment Utilized During Treatment: Gait belt;Rolling walker   Activity Tolerance Patient tolerated treatment well   Patient Left in bed;with call bell/phone within reach;with bed alarm set  Nurse Communication Mobility status;Other (comment) (pt used BR, took off supplemental 02 as sats were 96% or greater)    Time: 2229-7989 OT Time Calculation (min): 30 min  Charges: OT General Charges $OT Visit: 1 Procedure OT Treatments $Self Care/Home Management : 23-37 mins  Siah Steely , MS, OTR/L, CLT Pager: [phone]  02/22/2015, 10:21 AM

## 2015-02-23 DIAGNOSIS — E440 Moderate protein-calorie malnutrition: Secondary | ICD-10-CM

## 2015-02-23 DIAGNOSIS — D638 Anemia in other chronic diseases classified elsewhere: Secondary | ICD-10-CM

## 2015-02-23 DIAGNOSIS — F1020 Alcohol dependence, uncomplicated: Secondary | ICD-10-CM

## 2015-02-23 DIAGNOSIS — R627 Adult failure to thrive: Secondary | ICD-10-CM

## 2015-02-23 DIAGNOSIS — G250 Essential tremor: Secondary | ICD-10-CM

## 2015-02-23 LAB — CBC
HEMATOCRIT: 26.9 % — AB (ref 39.0–52.0)
Hemoglobin: 8.3 g/dL — ABNORMAL LOW (ref 13.0–17.0)
MCH: 28.8 pg (ref 26.0–34.0)
MCHC: 30.9 g/dL (ref 30.0–36.0)
MCV: 93.4 fL (ref 78.0–100.0)
PLATELETS: 643 10*3/uL — AB (ref 150–400)
RBC: 2.88 MIL/uL — ABNORMAL LOW (ref 4.22–5.81)
RDW: 16.3 % — AB (ref 11.5–15.5)
WBC: 10.8 10*3/uL — AB (ref 4.0–10.5)

## 2015-02-23 MED ORDER — VITAMIN B-12 1000 MCG PO TABS
500.0000 ug | ORAL_TABLET | Freq: Every day | ORAL | Status: DC
  Administered 2015-02-23 – 2015-02-25 (×3): 500 ug via ORAL
  Filled 2015-02-23 (×3): qty 1

## 2015-02-23 MED ORDER — FERROUS SULFATE 325 (65 FE) MG PO TABS
325.0000 mg | ORAL_TABLET | Freq: Two times a day (BID) | ORAL | Status: DC
  Administered 2015-02-24 – 2015-02-25 (×4): 325 mg via ORAL
  Filled 2015-02-23 (×4): qty 1

## 2015-02-23 MED ORDER — SODIUM CHLORIDE 0.9 % IV SOLN
125.0000 mg | Freq: Once | INTRAVENOUS | Status: AC
  Administered 2015-02-23: 125 mg via INTRAVENOUS
  Filled 2015-02-23: qty 10

## 2015-02-23 NOTE — Progress Notes (Signed)
PROGRESS NOTE  [name]:[id number] DOB: 09-19-1955 DOA: 02/18/2015 PCP: No PCP Per Patient  HPI/Brief narrative 59 y.o. male , separated, apparently lives alone, depends on elderly parents for financial and other support, history of traumatic brain injury, cognitive impairment, possible depression, tobacco and alcohol abuse, seizure disorder-unclear if related to his TBI or alcohol withdrawal, initially presented to the [facility] with complaints of profound weight loss, chronic productive cough and hemoptysis. 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 He was treated with IV vancomycin, Zosyn and discharged 9/29 on voriconazole. However he never picked up Voriconazole and was readmitted to [facility] on 10/2 with septic shock which has since resolved. He was evaluated by surgery (Dr. Nestor Lewandowsky) on 02/08/15 who recommended that he will likely require thoracotomy and was transferred to [facility] on 10/7. TCTS & ID consulted. Thoracic surgery indicated that patient was not a candidate for surgery and recommended continued medical treatment with antifungals.   Assessment/Plan:  Chronic Cavitary Aspergillosis/left upper lobe - Patient has been evaluated by infectious disease, CCM and surgery at [facility] - ID input appreciated: Discontinued IV vancomycin and Zosyn on 10/8 and continued voriconazole-likely lifetime. - Thoracic surgery consulted on 10/7-appreciated input and not a surgical candidate and recommended medical management. - Workup thus far: Sputum culture 10/6: Appears normal. [place]ora-final results pending. AFB culture 9/212 & 9/20-pending. Sputum culture 02/02/15: Mold-previous mold sent to the state lab for identification. Light growth of Candida species. Rapid HIV screen: Nonreactive. QuantiFERON TB gold: Indeterminate. Blood culture 9/20: Negative. Sputum culture 9/20: Mold-sent to  state for identification  Paroxysmal Atrial fibrillation - On amiodarone. Not anticoagulation candidate secondary to hemoptysis from lung lesion. Currently in sinus rhythm.  Anemia of chronic disease/Iron deficiency/B12 deficiency - Status post PRBC transfusion 1 unit at [facility]. Hemoglobin stable. Follow CBC periodically and transfuse if hemoglobin less than 7 g per DL. -give dose of nulecit and start ferrous sulfate--iron saturation 5%, ferritin 219 -start B12 supplementation--serum B12--> 242  Seizure disorder - Unclear if related to his traumatic brain injury or alcohol withdrawal. Continue Topamax. Follows at [facility] neurology.  Hypokalemia - Replace as needed and follow. Magnesium normal.  Bilateral nonocclusive upper extremity DVT - Not on anticoagulation secondary to hemoptysis from lung lesion.  Non-severe (moderate) malnutrition in context of social or environmental circumstances - Continue nutritional supplements and dietitian input appreciated.  Tobacco abuse - Cessation counseled  Alcohol dependence - Continue multivitamins, folate and thiamine supplements. Likely out of window for withdrawal (has been hospitalized since 01/14/15). However monitor closely.  Chronic tremors - ? Benign essential tremors versus related to alcohol dependence.  History of traumatic brain injury and cognitive impairment  Adult failure to thrive  Depression - Continue Celexa  Thrombocytosis - Likely reactive and also due to iron deficiency anemia    DVT prophylaxis: SCD's Code Status: Full  Family Communication: Discussed with patient's father on 02/21/15: Updated care and answered questions. He informed me that he is unable to take care of patient and that he is already taking care of his own wife who is "invalid". Disposition Plan: Medically stable for DC to SNF when bed available. Discussed with case management and clinical Research officer, political party.    Consultants:  Thoracic Surgery  Infectious disease  Procedures:  None  Antibiotics:  IV Vancomycin 10/7> 10/8  IV Zosyn 10/7> 10/8  IV Voriconazole 10/7>  Procedures/Studies: Dg Chest 1 View  02/04/2015  CLINICAL DATA:  Shortness of breath.  Seizure. EXAM: CHEST 1 VIEW COMPARISON:  CT 02/03/2015.  Chest x-ray 02/02/2015. FINDINGS: Right PICC line in good anatomic position. Large cavitary lesion in the left upper lobe is again noted. Mild infiltrate in the right upper lobe and left lung base cannot be excluded. Tiny pleural effusions. No pneumothorax. Heart size normal. IMPRESSION: 1. Persistent unchanged cavitary lesion in the left upper lobe. 2. Mild infiltrate right upper lobe and left lower lobe cannot be excluded. Tiny bilateral pleural effusions. 3. Right PICC line stable position . Electronically Signed   By: Marcello Moores  Register   On: 02/04/2015 07:33   Dg Chest 2 View  02/08/2015  CLINICAL DATA:  Follow-up pneumonia, shortness of breath and sepsis. EXAM: CHEST  2 VIEW COMPARISON:  02/04/2015 chest radiograph FINDINGS: A right PICC terminates in the lower third of the superior vena cava. Stable cardiomediastinal silhouette with normal heart size. No pneumothorax. There are stable small bilateral pleural effusions. There is no appreciable change in the large cavitary left upper lobe mass with extensive surrounding patchy consolidation. There is stable mild hazy opacities in the left lower lobe. No new focal lung consolidation. No pulmonary edema. IMPRESSION: 1. Stable large left upper lobe cavitary mass with surrounding consolidation. Differential includes tuberculosis, fungal pneumonia and/or underlying neoplasm. Continued post treatment chest imaging follow-up is advised. 2. Stable mild hazy opacities in the left lower lobe. 3. Stable small bilateral pleural effusions. Electronically Signed   By: Ilona Sorrel M.D.   On: 02/08/2015 07:19   Ct Chest W  Contrast  02/03/2015  CLINICAL DATA:  59 year old male with acute respiratory failure likely secondary to Left-sided pneumonia. Complicated by septic shock with type II myocardial infarction due to demand ischemia, and atrial fibrillation with rapid ventricular rate.^77mL OMNIPAQUE IOHEXOL 300 MG/ML SOLN. EXAM: CT CHEST WITH CONTRAST TECHNIQUE: Multidetector CT imaging of the chest was performed during intravenous contrast administration. CONTRAST:  64mL OMNIPAQUE IOHEXOL 300 MG/ML  SOLN COMPARISON:  Chest x-ray 02/02/2015 FINDINGS: Heart: Heart size is normal. Coronary artery calcifications are present. Central line/PICC tip to the lower superior vena cava. Vascular structures: Pulmonary arteries are grossly normally opacified. Thoracic aorta is normal in appearance. Mediastinum/thyroid: The visualized portion of the thyroid gland has a normal appearance. Mediastinal lymph nodes are present. Pretracheal lymph node is 0.9 cm. Subcarinal lymph node is 1.2 cm. The small left hilar lymph nodes are present, largest measuring approximate 1.0 cm. Lungs/Airways: Within the left lung apex new there is a large cavitary lesion containing a rounded mass measuring 2 3.8 x 3.3 cm. 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 There is significant airspace disease surrounding this cavitary lesion, occupying the entire upper lobe. There patchy areas of infiltrate within the left lower lobe. Similar patchy densities identified within the right upper lobe. There are bilateral pleural effusions. Upper abdomen: Possible low-attenuation lesion within the central portion of the right kidney versus hydronephrosis or parapelvic cyst. This abnormality is incompletely evaluated as it is on the last image of this study. Gallbladder is present and is contracted. There is diffuse body wall edema. Stomach is distended. Chest wall/osseous structures: Unremarkable. IMPRESSION: 1. 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 The cavitary lesion may be the result of fungal infection, TB, or malignancy. 2. Patchy areas of infiltrate within the right upper lobe and left lower lobe. 3. Bilateral pleural effusions. 4. Coronary artery disease. 5. Right-sided central  line/PICC to the lower superior vena cava. 6. Small mediastinal and hilar lymph nodes may be reactive. 7. Low-attenuation structure within the central portion of the right kidney is incompletely evaluated. Consider further evaluation with renal ultrasound. 8. These results will be called to the ordering clinician or representative by the Radiologist Assistant, and communication documented in the PACS or zVision Dashboard. Electronically Signed   By: Nolon Nations M.D.   On: 02/03/2015 19:11   US Venous Img Upper Bilat  02/08/2015  CLINICAL DATA:  Arm edema, x1 week.  Right arm PICC line. EXAM: RIGHT UPPER EXTREMITY VENOUS DOPPLER ULTRASOUND TECHNIQUE: Gray-scale sonography with graded compression, as well as color Doppler and duplex ultrasound were performed to evaluate the upper extremity deep venous system from the level of the subclavian vein and including the jugular, axillary, basilic and upper cephalic vein. Spectral Doppler was utilized to evaluate flow at rest and with distal augmentation maneuvers. COMPARISON:  None. FINDINGS: There is nonocclusive thrombus around the PICC line in the brachial, axillary, and subclavian veins. These veins are partially compressible. Unremarkable right jugular, cephalic, radial and ulnar veins. On the left the left IJ vein and subclavian vein unremarkable. Axillary vein shows normal compressibility. There is mural thrombus in a brachial vein which is partially compressible. There is some mural thrombus in the basilic vein with persistent flow. Cephalic, radial and ulnar veins remain patent. IMPRESSION: 1. Right partially occlusive DVT involving brachial, axillary, and subclavian veins, adjacent to  PICC catheter. 2. Nonocclusive mural thrombus in left brachial and basilic veins. Electronically Signed   By: Lucrezia Europe M.D.   On: 02/08/2015 16:10   Dg Chest Port 1 View  02/13/2015  CLINICAL DATA:  Cough and short of breath EXAM: PORTABLE CHEST 1 VIEW COMPARISON:  02/08/2015.  CT chest 02/03/2015 FINDINGS: Left upper lobe density shows progressive consolidation. Based on the CT there was a cavitary infiltrate in the left upper lobe. Increased cavitation is seen laterally within the lesion. Left lower lobe remains clear. Right lung is clear. COPD with hyperinflation. Heart size and vascularity normal. IMPRESSION: Left upper lobe process shows evolutionary changes with increased density and consolidation in the left perihilar region and increased cavitation laterally. Findings most consistent with cavitary pneumonia. Continued follow-up is recommended to exclude underlying neoplasm or further complication. Electronically Signed   By: Franchot Gallo M.D.   On: 02/13/2015 09:42   Dg Chest Port 1 View  02/02/2015  CLINICAL DATA:  PICC line placement EXAM: PORTABLE CHEST - 1 VIEW COMPARISON:  02/02/2015 FINDINGS: Right PICC line has been placed.  The tip is in the SVC. Continued dense consolidation in the left upper lobe, unchanged. Heart is normal size. No confluent opacity on the right. IMPRESSION: Right PICC line tip in the SVC. No change in the dense consolidation in the left upper lobe. Electronically Signed   By: Rolm Baptise M.D.   On: 02/02/2015 15:13   Dg Chest Port 1 View  02/02/2015  CLINICAL DATA:  Pneumonia. Shortness of breath. Weight loss. Nights with EXAM: PORTABLE CHEST - 1 VIEW COMPARISON:  02/01/2015 FINDINGS: Dense consolidation again noted in the upper lobe, unchanged. Questionable central cavitation. No confluent opacity on the right. Heart is normal size. No visible effusions. IMPRESSION: Stable dense consolidation in the left upper lobe with questionable areas of central cavitation.  Electronically Signed   By: Rolm Baptise M.D.   On: 02/02/2015 11:07   Dg Chest Portable 1 View  02/01/2015  CLINICAL DATA:  Productive cough, shortness of breath, assess for TB EXAM: PORTABLE CHEST - 1 VIEW COMPARISON:  None. FINDINGS: There is consolidation of the left upper lobe with cavitation. The right lung is clear. There is no pleural effusion bilaterally. The mediastinal contour and cardiac silhouette are normal. The bony structures are normal. IMPRESSION: Consolidation of the left upper lobe with cavitation. The findings are likely infectious in nature, TB is not excluded based on the x-ray. Electronically Signed   By: Abelardo Diesel M.D.   On: 02/01/2015 09:15        Subjective: Patient denies fevers, chills, headache, chest pain, dyspnea, nausea, vomiting, diarrhea, abdominal pain, dysuria, hematuria   Objective: Filed Vitals:   02/22/15 2039 02/23/15 0526 02/23/15 1038 02/23/15 1208  BP: 95/55 110/66 96/54 99/57   Pulse: 83 82 80 81  Temp: 99.4 F (37.4 C) 97.9 F (36.6 C) 97.7 F (36.5 C) 98.6 F (37 C)  TempSrc: Oral Oral Oral Oral  Resp: 18 17 18 18   Height:      Weight:  51.619 kg (113 lb 12.8 oz)    SpO2: 98% 98%  98%    Intake/Output Summary (Last 24 hours) at 02/23/15 1802 Last data filed at 02/23/15 1321  Gross per 24 hour  Intake    960 ml  Output   1375 ml  Net   -415 ml   Weight change: -2.313 kg (-5 lb 1.6 oz) Exam:   General:  Pt is alert, follows commands appropriately, not in acute distress  HEENT: No icterus, No thrush, No neck mass, [place]/AT  Cardiovascular: RRR, S1/S2, no rubs, no gallops  Respiratory: CTA bilaterally, no wheezing, no crackles, no rhonchi  Abdomen: Soft/+BS, non tender, non distended, no guarding  Extremities: No edema, No lymphangitis, No petechiae, No rashes, no synovitis  Data Reviewed: Basic Metabolic Panel:  Recent Labs Lab 02/18/15 0559 02/18/15 1937 02/19/15 0418 02/20/15 0548  NA 143 138 144 142  K 3.0*  3.3* 3.3* 3.6  CL 112* 106 108 107  CO2 24 25 27 26   GLUCOSE 87 90 88 87  BUN 9 10 10 8   CREATININE 0.73 0.69 0.73 0.65  CALCIUM 8.0* 7.9* 8.2* 8.2*  MG 1.8 2.0  --   --    Liver Function Tests:  Recent Labs Lab 02/20/15 0548  AST 14*  ALT 10*  ALKPHOS 55  BILITOT 0.2*  PROT 5.4*  ALBUMIN 1.6*   No results for input(s): LIPASE, AMYLASE in the last 168 hours. No results for input(s): AMMONIA in the last 168 hours. CBC:  Recent Labs Lab 02/17/15 0431 02/18/15 0559 02/19/15 0418 02/20/15 0548 02/23/15 0321  WBC 15.2* 10.8* 13.0* 11.8* 10.8*  HGB 8.7* 8.5* 8.1* 8.1* 8.3*  HCT 26.5* 25.1* 24.9* 25.4* 26.9*  MCV 89.5 91.1 92.9 94.4 93.4  PLT 729* 674* 650* 620* 643*   Cardiac Enzymes: No results for input(s): CKTOTAL, CKMB, CKMBINDEX, TROPONINI in the last 168 hours. BNP: Invalid input(s): POCBNP CBG: No results for input(s): GLUCAP in the last 168 hours.  Recent Results (from the past 240 hour(s))  Culture, expectorated sputum-assessment     Status: None   Collection Time: 02/17/15  4:54 PM  Result Value Ref Range Status   Specimen Description EXPECTORATED SPUTUM  Final   Special Requests Immunocompromised  Final   Sputum evaluation THIS SPECIMEN IS ACCEPTABLE FOR SPUTUM CULTURE  Final   Report Status 02/17/2015 FINAL  Final  Culture, respiratory (NON-Expectorated)     Status: None   Collection  Time: 02/17/15  4:54 PM  Result Value Ref Range Status   Specimen Description EXPECTORATED SPUTUM  Final   Special Requests Immunocompromised Reflexed from [id number]  Final   Gram Stain   Final    MODERATE WBC SEEN FEW GRAM POSITIVE COCCI EXCELLENT SPECIMEN - 90-100% WBCS    Culture APPEARS TO BE NORMAL RESPIRATORY FLORA  Final   Report Status 02/20/2015 FINAL  Final     Scheduled Meds: . amiodarone  200 mg Oral Daily  . citalopram  20 mg Oral [id number]  . feeding supplement (ENSURE ENLIVE)  237 mL Oral BID BM  . ferric gluconate (FERRLECIT/NULECIT) IV  125 mg  Intravenous Once  . [START ON 02/24/2015] ferrous sulfate  325 mg Oral BID WC  . folic acid  1 mg Oral Daily  . guaiFENesin  1,200 mg Oral BID  . Influenza vac split quadrivalent PF  0.5 mL Intramuscular Tomorrow-1000  . mirtazapine  15 mg Oral QHS  . multivitamin with minerals  1 tablet Oral Daily  . primidone  200 mg Oral QHS  . sodium chloride  3 mL Intravenous Q12H  . thiamine  100 mg Oral Daily  . topiramate  25 mg Oral BID  . vitamin B-12  500 mcg Oral Daily  . voriconazole  200 mg Oral Q12H   Continuous Infusions:    Jonathan Vargus, DO  [facility] Pager [phone]  If 7PM-7AM, please contact night-coverage [email or web address] Password [email or web address] 02/23/2015, 6:02 PM   LOS: 5 days

## 2015-02-23 NOTE — Progress Notes (Signed)
CSW is continuing to search for placement- no bed offers at this time.  Supervisor is assisting in finding placement- [facility] is currently considering patient but no formal bed offer at this time  CSW will continue to follow.  Domenica Reamer, [place] Social Worker [phone]

## 2015-02-24 ENCOUNTER — Inpatient Hospital Stay: Payer: Self-pay | Admitting: Internal Medicine

## 2015-02-24 DIAGNOSIS — B449 Aspergillosis, unspecified: Secondary | ICD-10-CM

## 2015-02-24 DIAGNOSIS — E876 Hypokalemia: Secondary | ICD-10-CM

## 2015-02-24 LAB — BASIC METABOLIC PANEL
ANION GAP: 7 (ref 5–15)
BUN: 13 mg/dL (ref 6–20)
CALCIUM: 8.5 mg/dL — AB (ref 8.9–10.3)
CHLORIDE: 104 mmol/L (ref 101–111)
CO2: 26 mmol/L (ref 22–32)
Creatinine, Ser: 0.71 mg/dL (ref 0.61–1.24)
GFR calc non Af Amer: >60 mL/min (ref >60)
Glucose, Bld: 96 mg/dL (ref 65–99)
Potassium: 3.9 mmol/L (ref 3.5–5.1)
Sodium: 137 mmol/L (ref 135–145)

## 2015-02-24 NOTE — Progress Notes (Signed)
Nutrition Follow-up  DOCUMENTATION CODES:   Non-severe (moderate) malnutrition in context of social or environmental circumstances  INTERVENTION:  Continue Ensure Enlive po BID, each supplement provides 350 kcal and 20 grams of protein Provide Magic cup BID with meals, each supplement provides 290 kcal and 9 grams of protein Provide snack once daily Continue Multivitamin with minerals daily  NUTRITION DIAGNOSIS:    (Inability to manage self care) related to lethargy/confusion (likely inability to manage nutritional self care) as evidenced by moderate depletion of body fat, mild depletion of muscle mass.  Ongoing  GOAL:   Patient will meet greater than or equal to 90% of their needs  Unmet  MONITOR:   PO intake, Supplement acceptance, Weight trends, Labs, I & O's  REASON FOR ASSESSMENT:   Consult  (Malnutrition)  ASSESSMENT:   59 y.o. male , separated, apparently lives with a friend intermittently, depends on elderly parents for financial and other support, history of traumatic brain injury, cognitive impairment, possible depression, tobacco and alcohol abuse, seizure disorder-unclear if related to his TBI or alcohol withdrawal, initially presented to the [facility] with complaints of profound weight loss, chronic productive cough and hemoptysis.   Pt is unable to explain how his appetite has been or how he is eating. He states he likes the vanilla Ensue Enlive supplements and has been drinking them. He is agreeable to receiving pudding snacks and Magic Cup ice cream with meals. He reports intermittent difficulty with swallowing but, is unable provide further details. His weight has trended down over the past 4 days from 133 lbs to 112 lbs. RD encouraged increased PO intake with snacks and supplements between meals.   Labs: low hemoglobin, low calcium  Diet Order:  Diet regular Room service appropriate?: Yes; Fluid consistency:: Thin  Skin:  Reviewed,  no issues  Last BM:  10/10  Height:   Ht Readings from Last 1 Encounters:  02/18/15 5\' 9"  (1.753 m)    Weight:   Wt Readings from Last 1 Encounters:  02/24/15 112 lb 8 oz (51.03 kg)    Ideal Body Weight:  72.73 kg  BMI:  Body mass index is 16.61 kg/(m^2).  Estimated Nutritional Needs:   Kcal:  1800-2000  Protein:  70-85 grams  Fluid:  1.8 L/day  EDUCATION NEEDS:   No education needs identified at this time  [place], LDN Inpatient Clinical Dietitian Pager: [phone] After Hours Pager: [phone]

## 2015-02-24 NOTE — Progress Notes (Signed)
PROGRESS NOTE  Jonathan [name] XNT:[id number] DOB: 12/08/1955 DOA: 02/18/2015 PCP: No PCP Per Patient   HPI/Brief narrative 59 y.o. male , separated, apparently lives alone, depends on elderly parents for financial and other support, history of traumatic brain injury, cognitive impairment, possible depression, tobacco and alcohol abuse, seizure disorder-unclear if related to his TBI or alcohol withdrawal, initially presented to the [facility] with complaints of profound weight loss, chronic productive cough and hemoptysis. 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 He was treated with IV vancomycin, Zosyn and discharged 9/29 on voriconazole. However he never picked up Voriconazole and was readmitted to [facility] on 10/2 with septic shock which has since resolved. He was evaluated by surgery (Dr. Nestor Lewandowsky) on 02/08/15 who recommended that he will likely require thoracotomy and was transferred to [facility] on 10/7. TCTS & ID consulted. Thoracic surgery indicated that patient was not a candidate for surgery and recommended continued medical treatment with antifungals.   Assessment/Plan:  Chronic Cavitary Aspergillosis/left upper lobe - Patient has been evaluated by infectious disease, CCM and surgery at [facility] - ID input appreciated: Discontinued IV vancomycin and Zosyn on 10/8 and continued voriconazole-likely lifetime. - Thoracic surgery consulted on 10/7-appreciated input and not a surgical candidate and recommended medical management. - Workup thus far: Sputum culture 10/6: Appears normal. Flora-final results pending. AFB culture 9/212 & 9/20-pending. Sputum culture 02/02/15: Mold-previous mold sent to the state lab for identification. Light growth of Candida species. Rapid HIV screen: Nonreactive. QuantiFERON TB gold: Indeterminate. Blood culture 9/20: Negative. Sputum culture 9/20: Mold-sent to  state for identification  Paroxysmal Atrial fibrillation - On amiodarone. Not anticoagulation candidate secondary to hemoptysis from lung lesion. Currently in sinus rhythm.  Anemia of chronic disease/Iron deficiency/B12 deficiency - Status post PRBC transfusion 1 unit at [facility]. Hemoglobin stable. Follow CBC periodically and transfuse if hemoglobin less than 7 g per DL. -give dose of nulecit and start ferrous sulfate--iron saturation 5%, ferritin 219 -start B12 supplementation--serum B12--> 242  Seizure disorder - Unclear if related to his traumatic brain injury or alcohol withdrawal. Continue Topamax. Follows at [facility] neurology.  Hypokalemia - Replace as needed and follow. Magnesium normal.  Bilateral nonocclusive upper extremity DVT - Not on anticoagulation secondary to hemoptysis from lung lesion.  Non-severe (moderate) malnutrition in context of social or environmental circumstances - Continue nutritional supplements and dietitian input appreciated.  Tobacco abuse - Cessation counseled  Alcohol dependence - Continue multivitamins, folate and thiamine supplements. Likely out of window for withdrawal (has been hospitalized since 01/14/15). However monitor closely.  Chronic tremors - ? Benign essential tremors versus related to alcohol dependence.  History of traumatic brain injury and cognitive impairment  Adult failure to thrive  Depression - Continue Celexa  Thrombocytosis - Likely reactive and also due to iron deficiency anemia    DVT prophylaxis: SCD's Code Status: Full  Family Communication: Discussed with patient's father on 02/21/15: Updated care and answered questions. He informed me that he is unable to take care of patient and that he is already taking care of his own wife who is "invalid". Disposition Plan: Medically stable for DC to SNF when bed available. Discussed with case management and clinical Research officer, political party.    Consultants:  Thoracic Surgery  Infectious disease  Procedures:  None  Antibiotics:  IV Vancomycin 10/7> 10/8  IV Zosyn 10/7> 10/8  IV Voriconazole 10/7>  Procedures/Studies: Dg Chest 1 View  02/04/2015  CLINICAL DATA:  Shortness of breath.  Seizure. EXAM: CHEST 1 VIEW COMPARISON:  CT 02/03/2015.  Chest x-ray 02/02/2015. FINDINGS: Right PICC line in good anatomic position. Large cavitary lesion in the left upper lobe is again noted. Mild infiltrate in the right upper lobe and left lung base cannot be excluded. Tiny pleural effusions. No pneumothorax. Heart size normal. IMPRESSION: 1. Persistent unchanged cavitary lesion in the left upper lobe. 2. Mild infiltrate right upper lobe and left lower lobe cannot be excluded. Tiny bilateral pleural effusions. 3. Right PICC line stable position . Electronically Signed   By: Marcello Moores  Register   On: 02/04/2015 07:33   Dg Chest 2 View  02/08/2015  CLINICAL DATA:  Follow-up pneumonia, shortness of breath and sepsis. EXAM: CHEST  2 VIEW COMPARISON:  02/04/2015 chest radiograph FINDINGS: A right PICC terminates in the lower third of the superior vena cava. Stable cardiomediastinal silhouette with normal heart size. No pneumothorax. There are stable small bilateral pleural effusions. There is no appreciable change in the large cavitary left upper lobe mass with extensive surrounding patchy consolidation. There is stable mild hazy opacities in the left lower lobe. No new focal lung consolidation. No pulmonary edema. IMPRESSION: 1. Stable large left upper lobe cavitary mass with surrounding consolidation. Differential includes tuberculosis, fungal pneumonia and/or underlying neoplasm. Continued post treatment chest imaging follow-up is advised. 2. Stable mild hazy opacities in the left lower lobe. 3. Stable small bilateral pleural effusions. Electronically Signed   By: Ilona Sorrel M.D.   On: 02/08/2015 07:19   Ct Chest W  Contrast  02/03/2015  CLINICAL DATA:  59 year old male with acute respiratory failure likely secondary to Left-sided pneumonia. Complicated by septic shock with type II myocardial infarction due to demand ischemia, and atrial fibrillation with rapid ventricular rate.^21mL OMNIPAQUE IOHEXOL 300 MG/ML SOLN. EXAM: CT CHEST WITH CONTRAST TECHNIQUE: Multidetector CT imaging of the chest was performed during intravenous contrast administration. CONTRAST:  51mL OMNIPAQUE IOHEXOL 300 MG/ML  SOLN COMPARISON:  Chest x-ray 02/02/2015 FINDINGS: Heart: Heart size is normal. Coronary artery calcifications are present. Central line/PICC tip to the lower superior vena cava. Vascular structures: Pulmonary arteries are grossly normally opacified. Thoracic aorta is normal in appearance. Mediastinum/thyroid: The visualized portion of the thyroid gland has a normal appearance. Mediastinal lymph nodes are present. Pretracheal lymph node is 0.9 cm. Subcarinal lymph node is 1.2 cm. The small left hilar lymph nodes are present, largest measuring approximate 1.0 cm. Lungs/Airways: Within the left lung apex new there is a large cavitary lesion containing a rounded mass measuring 2 3.8 x 3.3 cm. 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 There is significant airspace disease surrounding this cavitary lesion, occupying the entire upper lobe. There patchy areas of infiltrate within the left lower lobe. Similar patchy densities identified within the right upper lobe. There are bilateral pleural effusions. Upper abdomen: Possible low-attenuation lesion within the central portion of the right kidney versus hydronephrosis or parapelvic cyst. This abnormality is incompletely evaluated as it is on the last image of this study. Gallbladder is present and is contracted. There is diffuse body wall edema. Stomach is distended. Chest wall/osseous structures: Unremarkable. IMPRESSION: 1. 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 The cavitary lesion may be the result of fungal infection, TB, or malignancy. 2. Patchy areas of infiltrate within the right upper lobe and left lower lobe. 3. Bilateral pleural effusions. 4. Coronary artery disease. 5. Right-sided central  line/PICC to the lower superior vena cava. 6. Small mediastinal and hilar lymph nodes may be reactive. 7. Low-attenuation structure within the central portion of the right kidney is incompletely evaluated. Consider further evaluation with renal ultrasound. 8. These results will be called to the ordering clinician or representative by the Radiologist Assistant, and communication documented in the PACS or zVision Dashboard. Electronically Signed   By: Nolon Nations M.D.   On: 02/03/2015 19:11   US Venous Img Upper Bilat  02/08/2015  CLINICAL DATA:  Arm edema, x1 week.  Right arm PICC line. EXAM: RIGHT UPPER EXTREMITY VENOUS DOPPLER ULTRASOUND TECHNIQUE: Gray-scale sonography with graded compression, as well as color Doppler and duplex ultrasound were performed to evaluate the upper extremity deep venous system from the level of the subclavian vein and including the jugular, axillary, basilic and upper cephalic vein. Spectral Doppler was utilized to evaluate flow at rest and with distal augmentation maneuvers. COMPARISON:  None. FINDINGS: There is nonocclusive thrombus around the PICC line in the brachial, axillary, and subclavian veins. These veins are partially compressible. Unremarkable right jugular, cephalic, radial and ulnar veins. On the left the left IJ vein and subclavian vein unremarkable. Axillary vein shows normal compressibility. There is mural thrombus in a brachial vein which is partially compressible. There is some mural thrombus in the basilic vein with persistent flow. Cephalic, radial and ulnar veins remain patent. IMPRESSION: 1. Right partially occlusive DVT involving brachial, axillary, and subclavian veins, adjacent to  PICC catheter. 2. Nonocclusive mural thrombus in left brachial and basilic veins. Electronically Signed   By: Lucrezia Europe M.D.   On: 02/08/2015 16:10   Dg Chest Port 1 View  02/13/2015  CLINICAL DATA:  Cough and short of breath EXAM: PORTABLE CHEST 1 VIEW COMPARISON:  02/08/2015.  CT chest 02/03/2015 FINDINGS: Left upper lobe density shows progressive consolidation. Based on the CT there was a cavitary infiltrate in the left upper lobe. Increased cavitation is seen laterally within the lesion. Left lower lobe remains clear. Right lung is clear. COPD with hyperinflation. Heart size and vascularity normal. IMPRESSION: Left upper lobe process shows evolutionary changes with increased density and consolidation in the left perihilar region and increased cavitation laterally. Findings most consistent with cavitary pneumonia. Continued follow-up is recommended to exclude underlying neoplasm or further complication. Electronically Signed   By: Franchot Gallo M.D.   On: 02/13/2015 09:42   Dg Chest Port 1 View  02/02/2015  CLINICAL DATA:  PICC line placement EXAM: PORTABLE CHEST - 1 VIEW COMPARISON:  02/02/2015 FINDINGS: Right PICC line has been placed.  The tip is in the SVC. Continued dense consolidation in the left upper lobe, unchanged. Heart is normal size. No confluent opacity on the right. IMPRESSION: Right PICC line tip in the SVC. No change in the dense consolidation in the left upper lobe. Electronically Signed   By: Rolm Baptise M.D.   On: 02/02/2015 15:13   Dg Chest Port 1 View  02/02/2015  CLINICAL DATA:  Pneumonia. Shortness of breath. Weight loss. Nights with EXAM: PORTABLE CHEST - 1 VIEW COMPARISON:  02/01/2015 FINDINGS: Dense consolidation again noted in the upper lobe, unchanged. Questionable central cavitation. No confluent opacity on the right. Heart is normal size. No visible effusions. IMPRESSION: Stable dense consolidation in the left upper lobe with questionable areas of central cavitation.  Electronically Signed   By: Rolm Baptise M.D.   On: 02/02/2015 11:07   Dg Chest Portable 1 View  02/01/2015  CLINICAL DATA:  Productive cough, shortness of breath, assess for TB EXAM: PORTABLE CHEST - 1 VIEW COMPARISON:  None. FINDINGS: There is consolidation of the left upper lobe with cavitation. The right lung is clear. There is no pleural effusion bilaterally. The mediastinal contour and cardiac silhouette are normal. The bony structures are normal. IMPRESSION: Consolidation of the left upper lobe with cavitation. The findings are likely infectious in nature, TB is not excluded based on the x-ray. Electronically Signed   By: Abelardo Diesel M.D.   On: 02/01/2015 09:15        Subjective: Patient denies any fevers, chills, chest pain, shortness breath, nausea, vomiting, diarrhea.  Objective: Filed Vitals:   02/23/15 1208 02/23/15 2038 02/24/15 0630 02/24/15 1147  BP: 99/57 97/56 97/54  94/55  Pulse: 81 78 71 75  Temp: 98.6 F (37 C) 97.6 F (36.4 C) 97 F (36.1 C) 98.1 F (36.7 C)  TempSrc: Oral Oral Oral Oral  Resp: 18 16 18 18   Height:      Weight:   51.03 kg (112 lb 8 oz)   SpO2: 98% 97% 98% 97%    Intake/Output Summary (Last 24 hours) at 02/24/15 1807 Last data filed at 02/24/15 1347  Gross per 24 hour  Intake   1560 ml  Output   1425 ml  Net    135 ml   Weight change: -0.59 kg (-1 lb 4.8 oz) Exam:   General:  Pt is alert, follows commands appropriately, not in acute distress  HEENT: No icterus, No thrush, No neck mass, [place]/AT  Cardiovascular: RRR, S1/S2, no rubs, no gallops  Respiratory: Bilateral scattered rales. No wheezing. Good air movement.  Abdomen: Soft/+BS, non tender, non distended, no guarding  Extremities: No edema, No lymphangitis, No petechiae, No rashes, no synovitis  Data Reviewed: Basic Metabolic Panel:  Recent Labs Lab 02/18/15 0559 02/18/15 1937 02/19/15 0418 02/20/15 0548 02/24/15 0345  NA 143 138 144 142 137  K 3.0* 3.3* 3.3* 3.6  3.9  CL 112* 106 108 107 104  CO2 24 25 27 26 26   GLUCOSE 87 90 88 87 96  BUN 9 10 10 8 13   CREATININE 0.73 0.69 0.73 0.65 0.71  CALCIUM 8.0* 7.9* 8.2* 8.2* 8.5*  MG 1.8 2.0  --   --   --    Liver Function Tests:  Recent Labs Lab 02/20/15 0548  AST 14*  ALT 10*  ALKPHOS 55  BILITOT 0.2*  PROT 5.4*  ALBUMIN 1.6*   No results for input(s): LIPASE, AMYLASE in the last 168 hours. No results for input(s): AMMONIA in the last 168 hours. CBC:  Recent Labs Lab 02/18/15 0559 02/19/15 0418 02/20/15 0548 02/23/15 0321  WBC 10.8* 13.0* 11.8* 10.8*  HGB 8.5* 8.1* 8.1* 8.3*  HCT 25.1* 24.9* 25.4* 26.9*  MCV 91.1 92.9 94.4 93.4  PLT 674* 650* 620* 643*   Cardiac Enzymes: No results for input(s): CKTOTAL, CKMB, CKMBINDEX, TROPONINI in the last 168 hours. BNP: Invalid input(s): POCBNP CBG: No results for input(s): GLUCAP in the last 168 hours.  Recent Results (from the past 240 hour(s))  Culture, expectorated sputum-assessment     Status: None   Collection Time: 02/17/15  4:54 PM  Result Value Ref Range Status   Specimen Description EXPECTORATED SPUTUM  Final   Special Requests Immunocompromised  Final   Sputum evaluation THIS SPECIMEN IS ACCEPTABLE FOR SPUTUM CULTURE  Final   Report Status 02/17/2015 FINAL  Final  Culture, respiratory (NON-Expectorated)     Status: None  Collection Time: 02/17/15  4:54 PM  Result Value Ref Range Status   Specimen Description EXPECTORATED SPUTUM  Final   Special Requests Immunocompromised Reflexed from [id number]  Final   Gram Stain   Final    MODERATE WBC SEEN FEW GRAM POSITIVE COCCI EXCELLENT SPECIMEN - 90-100% WBCS    Culture APPEARS TO BE NORMAL RESPIRATORY FLORA  Final   Report Status 02/20/2015 FINAL  Final     Scheduled Meds: . amiodarone  200 mg Oral Daily  . citalopram  20 mg Oral BH-q7a  . feeding supplement (ENSURE ENLIVE)  237 mL Oral BID BM  . ferrous sulfate  325 mg Oral BID WC  . folic acid  1 mg Oral Daily  .  guaiFENesin  1,200 mg Oral BID  . Influenza vac split quadrivalent PF  0.5 mL Intramuscular Tomorrow-1000  . mirtazapine  15 mg Oral QHS  . multivitamin with minerals  1 tablet Oral Daily  . primidone  200 mg Oral QHS  . sodium chloride  3 mL Intravenous Q12H  . thiamine  100 mg Oral Daily  . topiramate  25 mg Oral BID  . vitamin B-12  500 mcg Oral Daily  . voriconazole  200 mg Oral Q12H   Continuous Infusions:    Enedina Pair, DO  [facility] Pager [phone]  If 7PM-7AM, please contact night-coverage [email or web address] Password [email or web address] 02/24/2015, 6:07 PM   LOS: 6 days

## 2015-02-24 NOTE — Progress Notes (Signed)
CSW spoke with patient's father Jonathan [name] again today via telephone. Discussed continued search for a SNF bed for patient and that he could not be placed without someone signing consent for him to be treated at the facility.  He has, in the past, refused to sign but now agrees to sign as long as he will not be held financially accountable for his son's debts.  Patient states that he is 59 years old and is caring for an invalid wife; they are not able to meet their financial needs at this time and thus he cannot take on any further debt. Active bed search remains in place for a Difficult to Place bed.  Jonathan [name]. Jonathan [name], [place]

## 2015-02-25 ENCOUNTER — Encounter
Admission: RE | Admit: 2015-02-25 | Discharge: 2015-02-25 | Disposition: A | Discharge status: Home / Self Care | Payer: MEDICAID | Source: Ambulatory Visit | Attending: Internal Medicine | Admitting: Internal Medicine

## 2015-02-25 DIAGNOSIS — F10230 Alcohol dependence with withdrawal, uncomplicated: Secondary | ICD-10-CM

## 2015-02-25 MED ORDER — ENSURE ENLIVE PO LIQD: 237.0000 mL | Freq: Two times a day (BID) | ORAL | Status: AC

## 2015-02-25 MED ORDER — FERROUS SULFATE 325 (65 FE) MG PO TABS: 325.0000 mg | ORAL_TABLET | Freq: Two times a day (BID) | ORAL | Status: AC

## 2015-02-25 MED ORDER — VORICONAZOLE 200 MG PO TABS: 200.0000 mg | ORAL_TABLET | Freq: Two times a day (BID) | ORAL | Status: AC

## 2015-02-25 MED ORDER — MIRTAZAPINE 15 MG PO TABS: 15.0000 mg | ORAL_TABLET | Freq: Every day | ORAL | Status: AC

## 2015-02-25 MED ORDER — CYANOCOBALAMIN 500 MCG PO TABS: 500.0000 ug | ORAL_TABLET | Freq: Every day | ORAL | Status: AC

## 2015-02-25 NOTE — Clinical Social Work Placement (Signed)
   CLINICAL SOCIAL WORK PLACEMENT  NOTE  Date:  02/25/2015  Patient Details  Name: Jonathan [name] MRN: [id number] Date of Birth: June 24, 1955  Clinical Social Work is seeking post-discharge placement for this patient at the [place] level of care (*CSW will initial, date and re-position this form in  chart as items are completed):  No (Difficult to place patient)   Patient/family provided with [facility] Work Department's list of facilities offering this level of care within the geographic area requested by the patient (or if unable, by the patient's family).   (DTP pateint)   Patient/family informed of their freedom to choose among providers that offer the needed level of care, that participate in Medicare, Medicaid or managed care program needed by the patient, have an available bed and are willing to accept the patient.      Patient/family informed of [facility]'s ownership interest in [facility] and [facility], as well as of the fact that they are under no obligation to receive care at these facilities.  PASRR submitted to EDS on 02/14/15     PASRR number received on 02/14/15     Existing PASRR number confirmed on       FL2 transmitted to all facilities in geographic area requested by pt/family on 02/14/15     FL2 transmitted to all facilities within larger geographic area on 02/21/15     Patient informed that his/her managed care company has contracts with or will negotiate with certain facilities, including the following:        Yes   Patient/family informed of bed offers received.  Patient chooses bed at [facility] (Arranged due to Difficult to Place situation. No other choices  available.)     Physician recommends and patient chooses bed at      Patient to be transferred to [facility] on 02/25/15.  Patient to be transferred to facility by Ambulance [facility])     Patient family notified on 02/25/15 of transfer.  Name  of family member notified:  Father:  Jonathan [name]     PHYSICIAN Please prepare priority discharge summary, including medications, Please sign FL2, Please prepare prescriptions     Additional Comment: 02/25/15   Ok for d/c today to SNF per MD DC packet prepared and will send d/c summary to facility.  Nursing notified to call report.  Patient remains alert to person only; father is agreeable to sign patient into the facility.  Patient is a Difficult to Place category patient. Notified Zack Administrator of [facility] of above. CSW signing off.  Lorie Phenix. [place], [address]    _______________________________________________ Williemae Area, LCSW 02/25/2015, 10:25 AM

## 2015-02-25 NOTE — Progress Notes (Signed)
Patient will be discharge today around 2 pm  to [facility] in [place] via ambulance. Report given to Sarah.

## 2015-02-25 NOTE — Discharge Summary (Signed)
Physician Discharge Summary  Jonathan [name] KCM:[id number] DOB: [birth year] DOA: 02/18/2015  PCP: No PCP Per Patient  Admit date: 02/18/2015 Discharge date: 02/25/2015  Recommendations for Outpatient Follow-up:  1. Pt will need to follow up with PCP in 2 weeks post discharge 2. Please obtain BMP and CBC in 2 weeks  Discharge Diagnoses:  Chronic Cavitary Aspergillosis/left upper lobe - Patient has been evaluated by infectious disease, CCM and surgery at [facility] - ID input appreciated: Discontinued IV vancomycin and Zosyn on 10/8 and continued voriconazole-likely lifetime. - Thoracic surgery consulted on 10/7-appreciated input and not a surgical candidate and recommended medical management. - Workup thus far: Sputum culture 10/6: Appears normal. Flora-final results pending. AFB culture 9/212 & 9/20-pending. Sputum culture 02/02/15: Mold-previous mold sent to the state lab for identification. Light growth of Candida species. Rapid HIV screen: Nonreactive. QuantiFERON TB gold: Indeterminate. Blood culture 9/20: Negative. Sputum culture 9/20: Mold-sent to state for identification  Paroxysmal Atrial fibrillation - On amiodarone. Not anticoagulation candidate secondary to hemoptysis from lung lesion. Currently in sinus rhythm.  Anemia of chronic disease/Iron deficiency/B12 deficiency - Status post PRBC transfusion 1 unit at [facility]. Hemoglobin stable. Follow CBC periodically and transfuse if hemoglobin less than 7 g per DL. -give dose of nulecit and start ferrous sulfate--iron saturation 5%, ferritin 219 -start B12 supplementation--serum B12--> 242  Seizure disorder - Unclear if related to his traumatic brain injury or alcohol withdrawal. Continue Topamax. Follows at [facility] neurology.  Hypokalemia - Replace as needed and follow. Magnesium normal. - K = 3.9 on 10/13  Bilateral nonocclusive upper extremity DVT - Not on anticoagulation  secondary to hemoptysis from lung lesion.  Non-severe (moderate) malnutrition in context of social or environmental circumstances - Continue nutritional supplements and dietitian input appreciated.  Tobacco abuse - Cessation counseled  Alcohol dependence - Continue multivitamins, folate and thiamine supplements. Likely out of window for withdrawal (has been hospitalized since 01/14/15). However monitor closely.  Chronic tremors - ? Benign essential tremors versus related to alcohol dependence.  History of traumatic brain injury and cognitive impairment  Adult failure to thrive  Depression - Continue Celexa  Thrombocytosis - Likely reactive and also due to iron deficiency anemia   Discharge Condition: stable  Disposition: [facility]  Diet:regular Wt Readings from Last 3 Encounters:  02/25/15 49.578 kg (109 lb 4.8 oz)  02/13/15 59.512 kg (131 lb 3.2 oz)  02/10/15 59.104 kg (130 lb 4.8 oz)    History of present illness:  59 y.o. male , separated, apparently lives alone, depends on elderly parents for financial and other support, history of traumatic brain injury, cognitive impairment, possible depression, tobacco and alcohol abuse, seizure disorder-unclear if related to his TBI or alcohol withdrawal, initially presented to the [facility] with complaints of profound weight loss, chronic productive cough and hemoptysis. 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 He was treated with IV vancomycin, Zosyn and discharged 9/29 on voriconazole. However he never picked up Voriconazole and was readmitted to [facility] on 10/2 with septic shock which has since resolved. He was evaluated by surgery (Dr. Nestor Lewandowsky) on 02/08/15 who recommended that he will likely require thoracotomy and was transferred to [facility] on 10/7. TCTS & ID consulted. Thoracic surgery indicated that patient was not a candidate for surgery and recommended  continued medical treatment with antifungals.  Consultants: ID--[place]  Discharge Exam: Filed Vitals:   02/25/15 0956  BP: 96/53  Pulse: 73  Temp: 98 F (36.7 C)  Resp: 16   Filed Vitals:   02/24/15 1147 02/24/15 2057 02/25/15 0519 02/25/15 0956  BP: 94/55 93/54 92/68  96/53  Pulse: 75 73 70 73  Temp: 98.1 F (36.7 C) 98 F (36.7 C) 98.2 F (36.8 C) 98 F (36.7 C)  TempSrc: Oral Oral Oral   Resp: 18 19 16 16   Height:      Weight:   49.578 kg (109 lb 4.8 oz)   SpO2: 97% 98% 100% 98%   General: Awake and alert, NAD, pleasant, cooperative Cardiovascular: RRR, no rub, no gallop, no S3 Respiratory: scattered rales bilateral Abdomen:soft, nontender, nondistended, positive bowel sounds Extremities: No edema, No lymphangitis, no petechiae  Discharge Instructions      Discharge Instructions    Diet - low sodium heart healthy    Complete by:  As directed      Increase activity slowly    Complete by:  As directed             Medication List    TAKE these medications        amiodarone 200 MG tablet  Commonly known as:  PACERONE  Take 1 tablet (200 mg total) by mouth daily.     citalopram 20 MG tablet  Commonly known as:  CELEXA  Take 20 mg by mouth every morning.     cyanocobalamin 500 MCG tablet  Take 1 tablet (500 mcg total) by mouth daily.     feeding supplement (ENSURE ENLIVE) Liqd  Take 237 mLs by mouth 2 (two) times daily between meals.     ferrous sulfate 325 (65 FE) MG tablet  Take 1 tablet (325 mg total) by mouth 2 (two) times daily with a meal.     guaiFENesin 600 MG 12 hr tablet  Commonly known as:  MUCINEX  Take 1 tablet (600 mg total) by mouth 2 (two) times daily as needed.     mirtazapine 15 MG tablet  Commonly known as:  REMERON  Take 1 tablet (15 mg total) by mouth at bedtime.     primidone 50 MG tablet  Commonly known as:  MYSOLINE  Take 200 mg by mouth at bedtime.     TOPAMAX 25 MG tablet  Generic drug:  topiramate  Take 25 mg by  mouth 2 (two) times daily. 1 tablet in the morning and the second tablet 6 hours after the first     voriconazole 200 MG tablet  Commonly known as:  VFEND  Take 1 tablet (200 mg total) by mouth every 12 (twelve) hours.         The results of significant diagnostics from this hospitalization (including imaging, microbiology, ancillary and laboratory) are listed below for reference.    Significant Diagnostic Studies: Dg Chest 1 View  02/04/2015  CLINICAL DATA:  Shortness of breath.  Seizure. EXAM: CHEST 1 VIEW COMPARISON:  CT 02/03/2015.  Chest x-ray 02/02/2015. FINDINGS: Right PICC line in good anatomic position. Large cavitary lesion in the left upper lobe is again noted. Mild infiltrate in the right upper lobe and left lung base cannot be excluded. Tiny pleural effusions. No pneumothorax. Heart size normal. IMPRESSION: 1. Persistent unchanged cavitary lesion in the left upper lobe. 2. Mild infiltrate right upper lobe and left lower lobe cannot be excluded. Tiny bilateral pleural effusions. 3. Right PICC line stable position . Electronically Signed   By: Marcello Moores  Register   On: 02/04/2015 07:33   Dg Chest 2 View  02/08/2015  CLINICAL DATA:  Follow-up pneumonia, shortness of breath and sepsis. EXAM: CHEST  2 VIEW COMPARISON:  02/04/2015 chest radiograph FINDINGS: A right PICC terminates in the lower third of the superior vena cava. Stable cardiomediastinal silhouette with normal heart size. No pneumothorax. There are stable small bilateral pleural effusions. There is no appreciable change in the large cavitary left upper lobe mass with extensive surrounding patchy consolidation. There is stable mild hazy opacities in the left lower lobe. No new focal lung consolidation. No pulmonary edema. IMPRESSION: 1. Stable large left upper lobe cavitary mass with surrounding consolidation. Differential includes tuberculosis, fungal pneumonia and/or underlying neoplasm. Continued post treatment chest imaging  follow-up is advised. 2. Stable mild hazy opacities in the left lower lobe. 3. Stable small bilateral pleural effusions. Electronically Signed   By: Ilona Sorrel M.D.   On: 02/08/2015 07:19   Ct Chest W Contrast  02/03/2015  CLINICAL DATA:  59 year old male with acute respiratory failure likely secondary to Left-sided pneumonia. Complicated by septic shock with type II myocardial infarction due to demand ischemia, and atrial fibrillation with rapid ventricular rate.^74mL OMNIPAQUE IOHEXOL 300 MG/ML SOLN. EXAM: CT CHEST WITH CONTRAST TECHNIQUE: Multidetector CT imaging of the chest was performed during intravenous contrast administration. CONTRAST:  64mL OMNIPAQUE IOHEXOL 300 MG/ML  SOLN COMPARISON:  Chest x-ray 02/02/2015 FINDINGS: Heart: Heart size is normal. Coronary artery calcifications are present. Central line/PICC tip to the lower superior vena cava. Vascular structures: Pulmonary arteries are grossly normally opacified. Thoracic aorta is normal in appearance. Mediastinum/thyroid: The visualized portion of the thyroid gland has a normal appearance. Mediastinal lymph nodes are present. Pretracheal lymph node is 0.9 cm. Subcarinal lymph node is 1.2 cm. The small left hilar lymph nodes are present, largest measuring approximate 1.0 cm. Lungs/Airways: Within the left lung apex new there is a large cavitary lesion containing a rounded mass measuring 2 3.8 x 3.3 cm. 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 There is significant airspace disease surrounding this cavitary lesion, occupying the entire upper lobe. There patchy areas of infiltrate within the left lower lobe. Similar patchy densities identified within the right upper lobe. There are bilateral pleural effusions. Upper abdomen: Possible low-attenuation lesion within the central portion of the right kidney versus hydronephrosis or parapelvic cyst. This abnormality is incompletely evaluated as it is on the last image of this study. Gallbladder is present and  is contracted. There is diffuse body wall edema. Stomach is distended. Chest wall/osseous structures: Unremarkable. IMPRESSION: 1. 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 The cavitary lesion may be the result of fungal infection, TB, or malignancy. 2. Patchy areas of infiltrate within the right upper lobe and left lower lobe. 3. Bilateral pleural effusions. 4. Coronary artery disease. 5. Right-sided central line/PICC to the lower superior vena cava. 6. Small mediastinal and hilar lymph nodes may be reactive. 7. Low-attenuation structure within the central portion of the right kidney is incompletely evaluated. Consider further evaluation with renal ultrasound. 8. These results will be called to the ordering clinician or representative by the Radiologist Assistant, and communication documented in the PACS or zVision Dashboard. Electronically Signed   By: Nolon Nations M.D.   On: 02/03/2015 19:11   US Venous Img Upper Bilat  02/08/2015  CLINICAL DATA:  Arm edema, x1 week.  Right arm PICC line. EXAM: RIGHT UPPER EXTREMITY VENOUS DOPPLER ULTRASOUND TECHNIQUE: Gray-scale sonography with graded compression, as well as color Doppler and duplex ultrasound were performed to evaluate the upper extremity  deep venous system from the level of the subclavian vein and including the jugular, axillary, basilic and upper cephalic vein. Spectral Doppler was utilized to evaluate flow at rest and with distal augmentation maneuvers. COMPARISON:  None. FINDINGS: There is nonocclusive thrombus around the PICC line in the brachial, axillary, and subclavian veins. These veins are partially compressible. Unremarkable right jugular, cephalic, radial and ulnar veins. On the left the left IJ vein and subclavian vein unremarkable. Axillary vein shows normal compressibility. There is mural thrombus in a brachial vein which is partially compressible. There is some mural  thrombus in the basilic vein with persistent flow. Cephalic, radial and ulnar veins remain patent. IMPRESSION: 1. Right partially occlusive DVT involving brachial, axillary, and subclavian veins, adjacent to PICC catheter. 2. Nonocclusive mural thrombus in left brachial and basilic veins. Electronically Signed   By: Lucrezia Europe M.D.   On: 02/08/2015 16:10   Dg Chest Port 1 View  02/13/2015  CLINICAL DATA:  Cough and short of breath EXAM: PORTABLE CHEST 1 VIEW COMPARISON:  02/08/2015.  CT chest 02/03/2015 FINDINGS: Left upper lobe density shows progressive consolidation. Based on the CT there was a cavitary infiltrate in the left upper lobe. Increased cavitation is seen laterally within the lesion. Left lower lobe remains clear. Right lung is clear. COPD with hyperinflation. Heart size and vascularity normal. IMPRESSION: Left upper lobe process shows evolutionary changes with increased density and consolidation in the left perihilar region and increased cavitation laterally. Findings most consistent with cavitary pneumonia. Continued follow-up is recommended to exclude underlying neoplasm or further complication. Electronically Signed   By: Franchot Gallo M.D.   On: 02/13/2015 09:42   Dg Chest Port 1 View  02/02/2015  CLINICAL DATA:  PICC line placement EXAM: PORTABLE CHEST - 1 VIEW COMPARISON:  02/02/2015 FINDINGS: Right PICC line has been placed.  The tip is in the SVC. Continued dense consolidation in the left upper lobe, unchanged. Heart is normal size. No confluent opacity on the right. IMPRESSION: Right PICC line tip in the SVC. No change in the dense consolidation in the left upper lobe. Electronically Signed   By: Rolm Baptise M.D.   On: 02/02/2015 15:13   Dg Chest Port 1 View  02/02/2015  CLINICAL DATA:  Pneumonia. Shortness of breath. Weight loss. Nights with EXAM: PORTABLE CHEST - 1 VIEW COMPARISON:  02/01/2015 FINDINGS: Dense consolidation again noted in the upper lobe, unchanged. Questionable  central cavitation. No confluent opacity on the right. Heart is normal size. No visible effusions. IMPRESSION: Stable dense consolidation in the left upper lobe with questionable areas of central cavitation. Electronically Signed   By: Rolm Baptise M.D.   On: 02/02/2015 11:07   Dg Chest Portable 1 View  02/01/2015  CLINICAL DATA:  Productive cough, shortness of breath, assess for TB EXAM: PORTABLE CHEST - 1 VIEW COMPARISON:  None. FINDINGS: There is consolidation of the left upper lobe with cavitation. The right lung is clear. There is no pleural effusion bilaterally. The mediastinal contour and cardiac silhouette are normal. The bony structures are normal. IMPRESSION: Consolidation of the left upper lobe with cavitation. The findings are likely infectious in nature, TB is not excluded based on the x-ray. Electronically Signed   By: Abelardo Diesel M.D.   On: 02/01/2015 09:15     Microbiology: Recent Results (from the past 240 hour(s))  Culture, expectorated sputum-assessment     Status: None   Collection Time: 02/17/15  4:54 PM  Result Value Ref Range Status  Specimen Description EXPECTORATED SPUTUM  Final   Special Requests Immunocompromised  Final   Sputum evaluation THIS SPECIMEN IS ACCEPTABLE FOR SPUTUM CULTURE  Final   Report Status 02/17/2015 FINAL  Final  Culture, respiratory (NON-Expectorated)     Status: None   Collection Time: 02/17/15  4:54 PM  Result Value Ref Range Status   Specimen Description EXPECTORATED SPUTUM  Final   Special Requests Immunocompromised Reflexed from [id number]  Final   Gram Stain   Final    MODERATE WBC SEEN FEW GRAM POSITIVE COCCI EXCELLENT SPECIMEN - 90-100% WBCS    Culture APPEARS TO BE NORMAL RESPIRATORY FLORA  Final   Report Status 02/20/2015 FINAL  Final     Labs: Basic Metabolic Panel:  Recent Labs Lab 02/18/15 1937 02/19/15 0418 02/20/15 0548 02/24/15 0345  NA 138 144 142 137  K 3.3* 3.3* 3.6 3.9  CL 106 108 107 104  CO2 25 27 26 26     GLUCOSE 90 88 87 96  BUN 10 10 8 13   CREATININE 0.69 0.73 0.65 0.71  CALCIUM 7.9* 8.2* 8.2* 8.5*  MG 2.0  --   --   --    Liver Function Tests:  Recent Labs Lab 02/20/15 0548  AST 14*  ALT 10*  ALKPHOS 55  BILITOT 0.2*  PROT 5.4*  ALBUMIN 1.6*   No results for input(s): LIPASE, AMYLASE in the last 168 hours. No results for input(s): AMMONIA in the last 168 hours. CBC:  Recent Labs Lab 02/19/15 0418 02/20/15 0548 02/23/15 0321  WBC 13.0* 11.8* 10.8*  HGB 8.1* 8.1* 8.3*  HCT 24.9* 25.4* 26.9*  MCV 92.9 94.4 93.4  PLT 650* 620* 643*   Cardiac Enzymes: No results for input(s): CKTOTAL, CKMB, CKMBINDEX, TROPONINI in the last 168 hours. BNP: Invalid input(s): POCBNP CBG: No results for input(s): GLUCAP in the last 168 hours.  Time coordinating discharge:  Greater than 30 minutes  Signed:  Rashena Dowling, DO [facility] Pager: [phone] 02/25/2015, 10:32 AM

## 2015-02-25 NOTE — Progress Notes (Signed)
Patient discharge to [facility] accompanied via ambulance with two EMS personnel. All personal belongings given.Patient's stable.  No signs and symptoms of distress noted. Denies any pain.

## 2015-02-28 ENCOUNTER — Other Ambulatory Visit
Admission: RE | Admit: 2015-02-28 | Discharge: 2015-02-28 | Disposition: A | Discharge status: Home / Self Care | Payer: Medicaid Other | Source: Skilled Nursing Facility | Attending: Internal Medicine | Admitting: Internal Medicine

## 2015-02-28 DIAGNOSIS — R0600 Dyspnea, unspecified: Secondary | ICD-10-CM | POA: Insufficient documentation

## 2015-02-28 DIAGNOSIS — R0989 Other specified symptoms and signs involving the circulatory and respiratory systems: Secondary | ICD-10-CM | POA: Insufficient documentation

## 2015-02-28 DIAGNOSIS — R05 Cough: Secondary | ICD-10-CM | POA: Insufficient documentation

## 2015-02-28 LAB — COMPREHENSIVE METABOLIC PANEL
ALBUMIN: 2.4 g/dL — AB (ref 3.5–5.0)
ALT: 10 U/L — ABNORMAL LOW (ref 17–63)
ANION GAP: 6 (ref 5–15)
AST: 13 U/L — AB (ref 15–41)
Alkaline Phosphatase: 76 U/L (ref 38–126)
BUN: 20 mg/dL (ref 6–20)
CHLORIDE: 103 mmol/L (ref 101–111)
CO2: 24 mmol/L (ref 22–32)
Calcium: 8.4 mg/dL — ABNORMAL LOW (ref 8.9–10.3)
Creatinine, Ser: 0.75 mg/dL (ref 0.61–1.24)
GFR calc Af Amer: >60 mL/min (ref >60)
GFR calc non Af Amer: >60 mL/min (ref >60)
GLUCOSE: 97 mg/dL (ref 65–99)
POTASSIUM: 3.8 mmol/L (ref 3.5–5.1)
SODIUM: 133 mmol/L — AB (ref 135–145)
Total Protein: 6.7 g/dL (ref 6.5–8.1)

## 2015-02-28 LAB — CBC
HCT: 27.5 % — ABNORMAL LOW (ref 40.0–52.0)
HEMOGLOBIN: 8.9 g/dL — AB (ref 13.0–18.0)
MCH: 29 pg (ref 26.0–34.0)
MCHC: 32.2 g/dL (ref 32.0–36.0)
MCV: 90.1 fL (ref 80.0–100.0)
Platelets: 581 10*3/uL — ABNORMAL HIGH (ref 150–440)
RBC: 3.05 MIL/uL — AB (ref 4.40–5.90)
RDW: 15.8 % — ABNORMAL HIGH (ref 11.5–14.5)
WBC: 11.9 10*3/uL — ABNORMAL HIGH (ref 3.8–10.6)

## 2015-02-28 LAB — TSH: TSH: 2.076 u[IU]/mL (ref 0.350–4.500)

## 2015-02-28 LAB — MAGNESIUM: MAGNESIUM: 1.8 mg/dL (ref 1.7–2.4)

## 2015-03-03 LAB — VITAMIN D 1,25 DIHYDROXY
VITAMIN D3 1, 25 (OH): 71 pg/mL
Vitamin D 1, 25 (OH)2 Total: 71 pg/mL

## 2015-03-11 LAB — ACID FAST SMEAR+CULTURE W/RFLX (ARMC ONLY)
ACID FAST CULTURE: POSITIVE — AB
Acid Fast Smear: NEGATIVE

## 2015-03-14 LAB — AFB ID BY DNA PROBE
M AVIUM COMPLEX: NEGATIVE
M GORDONAE: POSITIVE
M KANSASII: NEGATIVE
M TUBERCULOSIS COMPLEX: NEGATIVE

## 2015-03-14 NOTE — Progress Notes (Signed)
Lab called patient growing mycobacterium GORDONAE. Looks like he was d.c on voriconozole. No sensitivities yet. PCP will need to follow up on results.

## 2015-03-15 ENCOUNTER — Encounter
Admission: RE | Admit: 2015-03-15 | Discharge: 2015-03-15 | Disposition: A | Discharge status: Home / Self Care | Payer: Self-pay | Source: Ambulatory Visit | Attending: Internal Medicine | Admitting: Internal Medicine

## 2015-03-15 DIAGNOSIS — R634 Abnormal weight loss: Secondary | ICD-10-CM | POA: Insufficient documentation

## 2015-03-17 LAB — CBC WITH DIFFERENTIAL/PLATELET
BASOS ABS: 0.1 10*3/uL (ref 0–0.1)
BASOS PCT: 1 %
EOS ABS: 0.2 10*3/uL (ref 0–0.7)
Eosinophils Relative: 3 %
HEMATOCRIT: 29.7 % — AB (ref 40.0–52.0)
HEMOGLOBIN: 9.8 g/dL — AB (ref 13.0–18.0)
Lymphocytes Relative: 20 %
Lymphs Abs: 1.7 10*3/uL (ref 1.0–3.6)
MCH: 30.7 pg (ref 26.0–34.0)
MCHC: 33.1 g/dL (ref 32.0–36.0)
MCV: 92.6 fL (ref 80.0–100.0)
MONOS PCT: 10 %
Monocytes Absolute: 0.8 10*3/uL (ref 0.2–1.0)
NEUTROS ABS: 5.9 10*3/uL (ref 1.4–6.5)
NEUTROS PCT: 66 %
Platelets: 468 10*3/uL — ABNORMAL HIGH (ref 150–440)
RBC: 3.21 MIL/uL — ABNORMAL LOW (ref 4.40–5.90)
RDW: 16.4 % — ABNORMAL HIGH (ref 11.5–14.5)
WBC: 8.7 10*3/uL (ref 3.8–10.6)

## 2015-03-17 LAB — ACID FAST SMEAR+CULTURE W/RFLX (ARMC ONLY)
Acid Fast Culture: NEGATIVE
Acid Fast Smear: NEGATIVE

## 2015-03-17 LAB — BASIC METABOLIC PANEL
ANION GAP: 8 (ref 5–15)
BUN: 19 mg/dL (ref 6–20)
CALCIUM: 9.3 mg/dL (ref 8.9–10.3)
CHLORIDE: 107 mmol/L (ref 101–111)
CO2: 25 mmol/L (ref 22–32)
CREATININE: 0.73 mg/dL (ref 0.61–1.24)
GFR calc non Af Amer: >60 mL/min (ref >60)
Glucose, Bld: 82 mg/dL (ref 65–99)
Potassium: 4.3 mmol/L (ref 3.5–5.1)
SODIUM: 140 mmol/L (ref 135–145)

## 2015-03-18 LAB — ACID FAST CULTURE WITH REFLEXED SENSITIVITIES
ACID FAST CULTURE: NEGATIVE
ACID FAST SMEAR: NEGATIVE

## 2015-03-23 LAB — CULTURE, RESPIRATORY

## 2015-03-23 LAB — CULTURE, RESPIRATORY W GRAM STAIN

## 2015-04-03 ENCOUNTER — Encounter: Payer: Self-pay | Admitting: Infectious Diseases

## 2015-04-14 ENCOUNTER — Encounter
Admission: RE | Admit: 2015-04-14 | Discharge: 2015-04-14 | Disposition: A | Discharge status: Home / Self Care | Payer: MEDICAID | Source: Ambulatory Visit | Attending: Internal Medicine | Admitting: Internal Medicine

## 2015-05-15 ENCOUNTER — Encounter
Admission: RE | Admit: 2015-05-15 | Discharge: 2015-05-15 | Disposition: A | Discharge status: Home / Self Care | Payer: MEDICAID | Source: Ambulatory Visit | Attending: Internal Medicine | Admitting: Internal Medicine

## 2015-06-15 ENCOUNTER — Encounter
Admission: RE | Admit: 2015-06-15 | Discharge: 2015-06-15 | Disposition: A | Discharge status: Home / Self Care | Payer: MEDICAID | Source: Ambulatory Visit | Attending: Internal Medicine | Admitting: Internal Medicine

## 2015-07-01 ENCOUNTER — Ambulatory Visit: Payer: Self-pay

## 2017-05-10 IMAGING — CT CT CHEST W/ CM
1 of 2 series · 14 of 31 positions shown, 18 images · IV contrast (omnipaque)
Comparison: Chest x-ray 02/02/2015

CLINICAL DATA: 59-year-old male with acute respiratory failure
likely secondary to Left-sided pneumonia. Complicated by septic
shock with type II myocardial infarction due to demand ischemia, and
atrial fibrillation with rapid ventricular rate.^75mL OMNIPAQUE
IOHEXOL 300 MG/ML SOLN.

EXAM:
CT CHEST WITH CONTRAST
TECHNIQUE: Multidetector CT imaging of the chest was performed during
intravenous contrast administration.
CONTRAST:  75mL OMNIPAQUE IOHEXOL 300 MG/ML  SOLN

[Series 2: routine chest with · axial · 0.75mm/px · z∈[-519,-199]mm · 14 of 76 slices shown, 18 images]
[im 6/76  mediastinal]
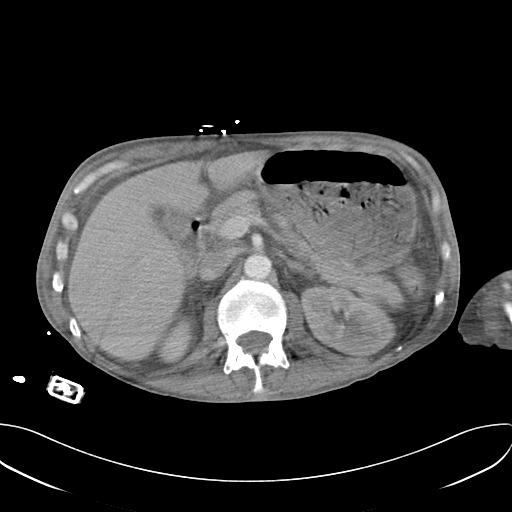
[im 6/76  lung]
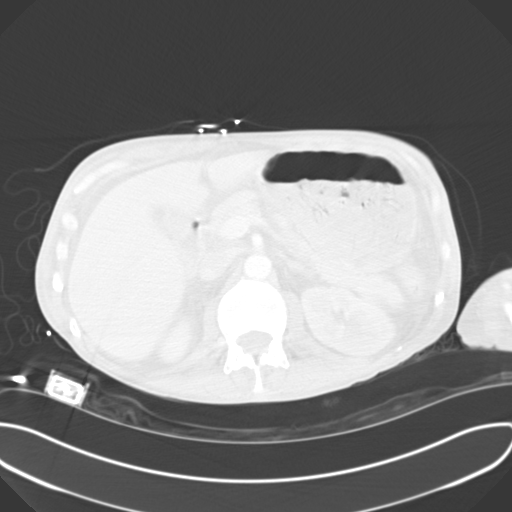
[im 12/76  lung]
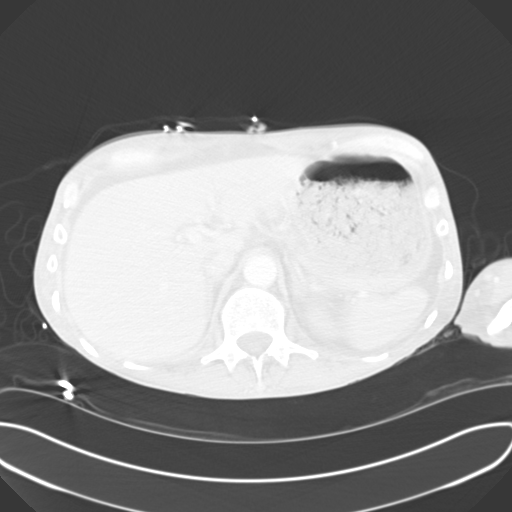
[im 18/76  lung]
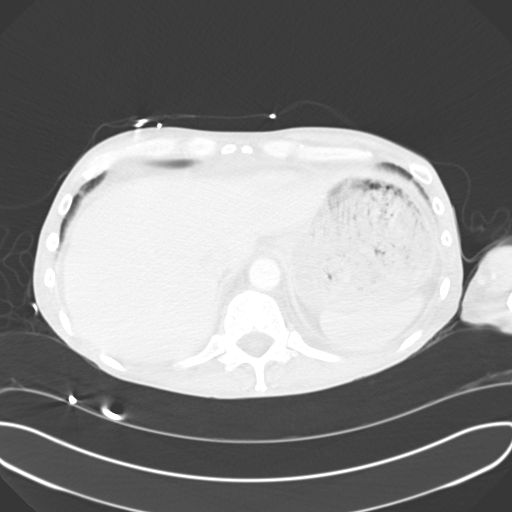
[im 24/76  lung]
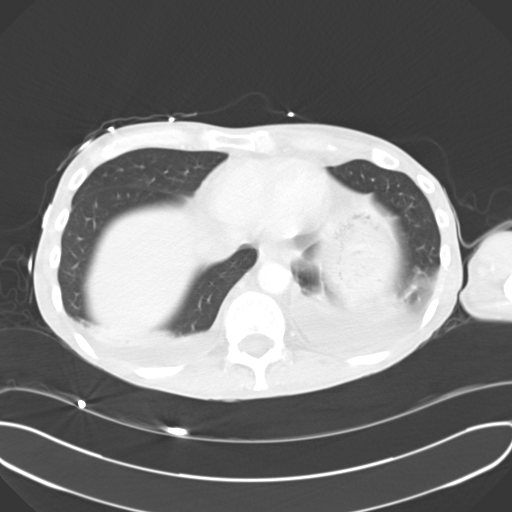
[im 29/76  mediastinal]
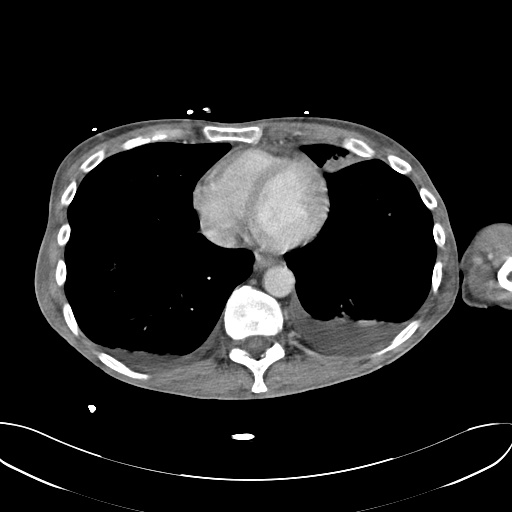
[im 29/76  lung]
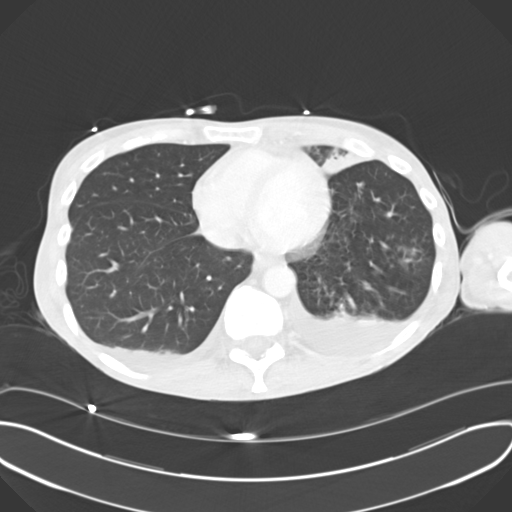
[im 35/76  lung]
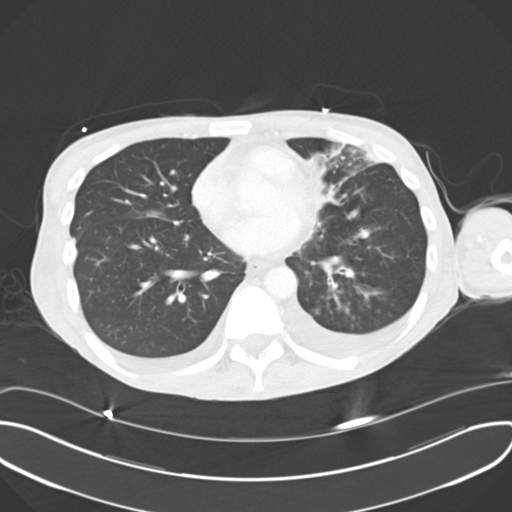
[im 37/76  lung]
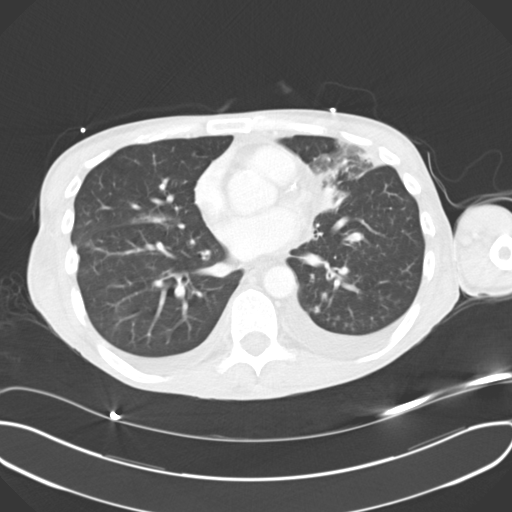
[im 38/76  lung]
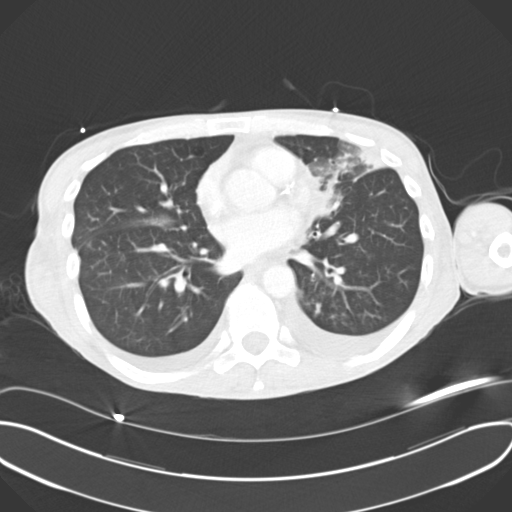
[im 41/76  mediastinal]
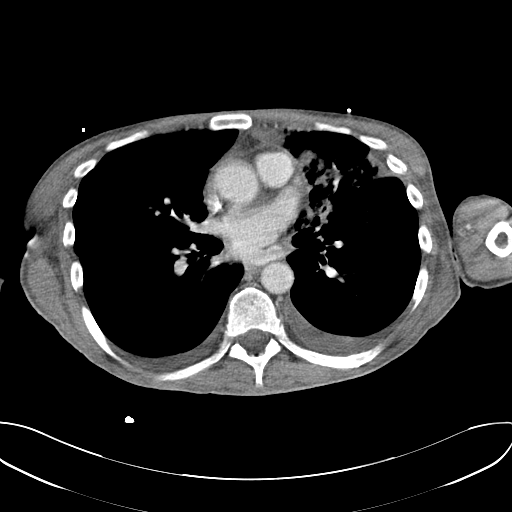
[im 41/76  lung]
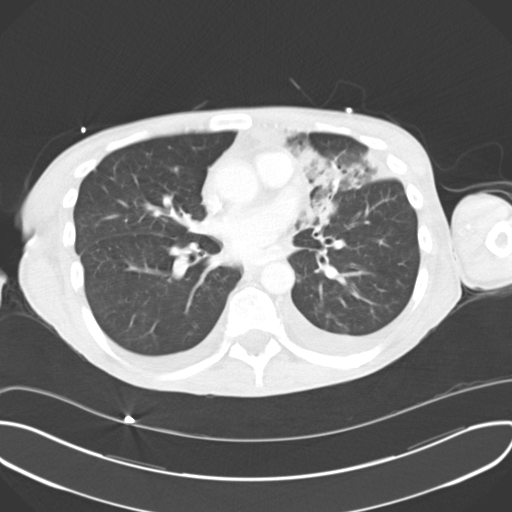
[im 47/76  lung]
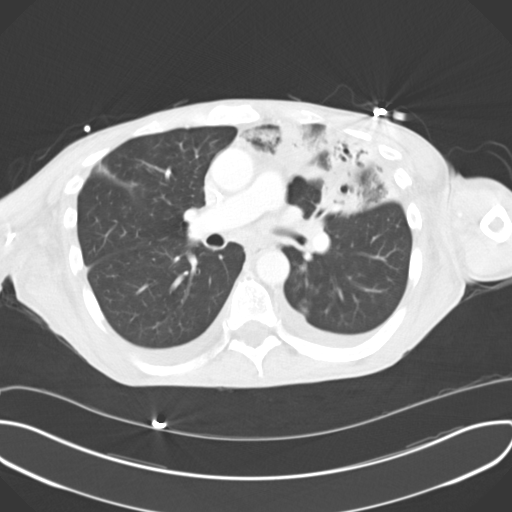
[im 52/76  lung]
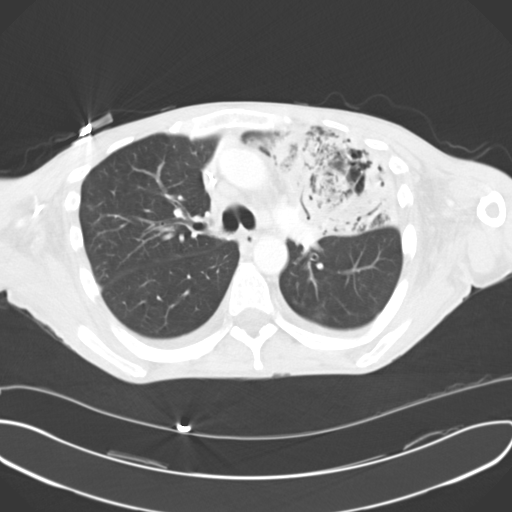
[im 58/76  lung]
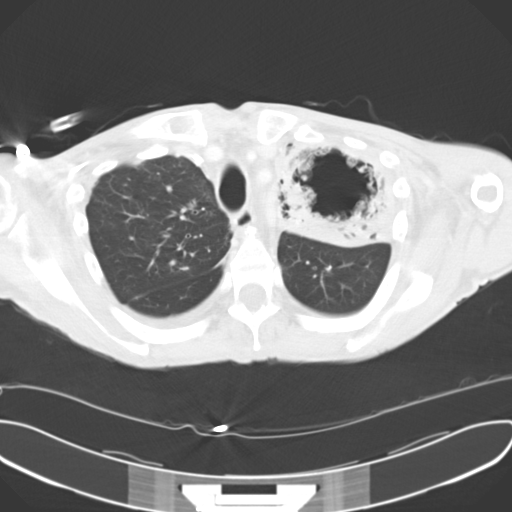
[im 64/76  mediastinal]
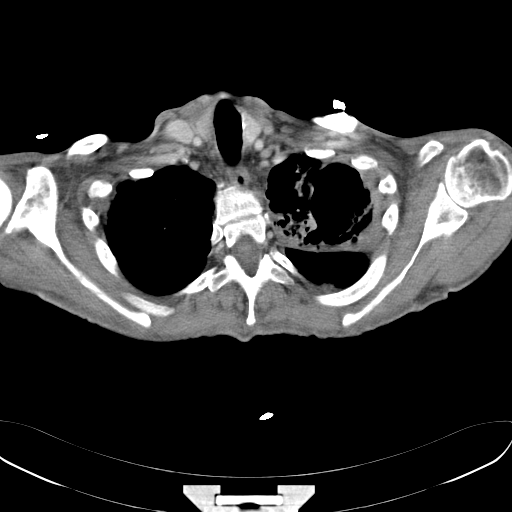
[im 64/76  lung]
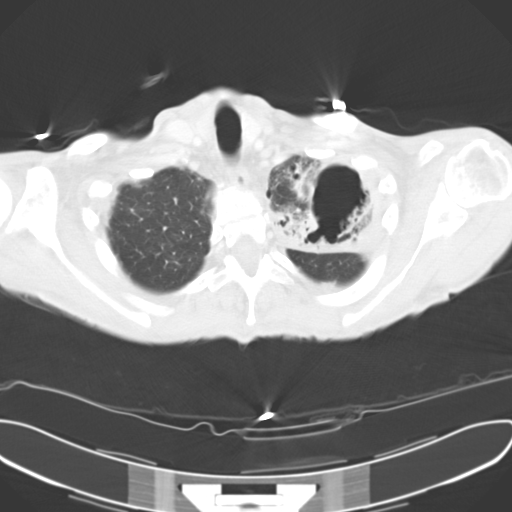
[im 70/76  lung]
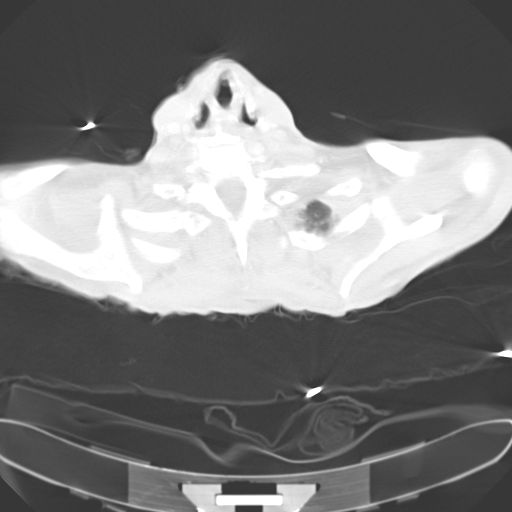

[14 of 31 positions shown; findings below may reference images not displayed]

FINDINGS: Heart: Heart size is normal. Coronary artery calcifications are
present. Central line/PICC tip to the lower superior vena cava.

Vascular structures: Pulmonary arteries are grossly normally
opacified. Thoracic aorta is normal in appearance.

Mediastinum/thyroid: The visualized portion of the thyroid gland has
a normal appearance. Mediastinal lymph nodes are present.
Pretracheal lymph node is 0.9 cm. Subcarinal lymph node is 1.2 cm.
The small left hilar lymph nodes are present, largest measuring
approximate 1.0 cm.

Lungs/Airways: Within the left lung apex new there is a large
cavitary lesion containing a rounded mass measuring 2 3.8 x 3.3 cm.
Findings are consistent with mycetoma. There is significant airspace
disease surrounding this cavitary lesion, occupying the entire upper
lobe. There patchy areas of infiltrate within the left lower lobe.
Similar patchy densities identified within the right upper lobe.
There are bilateral pleural effusions.

Upper abdomen: Possible low-attenuation lesion within the central
portion of the right kidney versus hydronephrosis or parapelvic
cyst. This abnormality is incompletely evaluated as it is on the
last image of this study. Gallbladder is present and is contracted.
There is diffuse body wall edema. Stomach is distended.

Chest wall/osseous structures: Unremarkable.
IMPRESSION: 1. Large cavitary lesion containing mass within the left upper lobe,
likely representing mycetoma/Aspergilloma. The cavitary lesion may
be the result of fungal infection, TB, or malignancy.
2. Patchy areas of infiltrate within the right upper lobe and left
lower lobe.
3. Bilateral pleural effusions.
4. Coronary artery disease.
5. Right-sided central line/PICC to the lower superior vena cava.
6. Small mediastinal and hilar lymph nodes may be reactive.
7. Low-attenuation structure within the central portion of the right
kidney is incompletely evaluated. Consider further evaluation with
renal ultrasound.
8. These results will be called to the ordering clinician or
representative by the Radiologist Assistant, and communication
documented in the PACS or zVision Dashboard.

## 2017-05-11 IMAGING — CR DG CHEST 1V
1 series · 1 of 1 positions shown · non-contrast
Comparison: CT 02/03/2015.  Chest x-ray 02/02/2015.

CLINICAL DATA: Shortness of breath.  Seizure.

EXAM:
CHEST 1 VIEW

[ap]
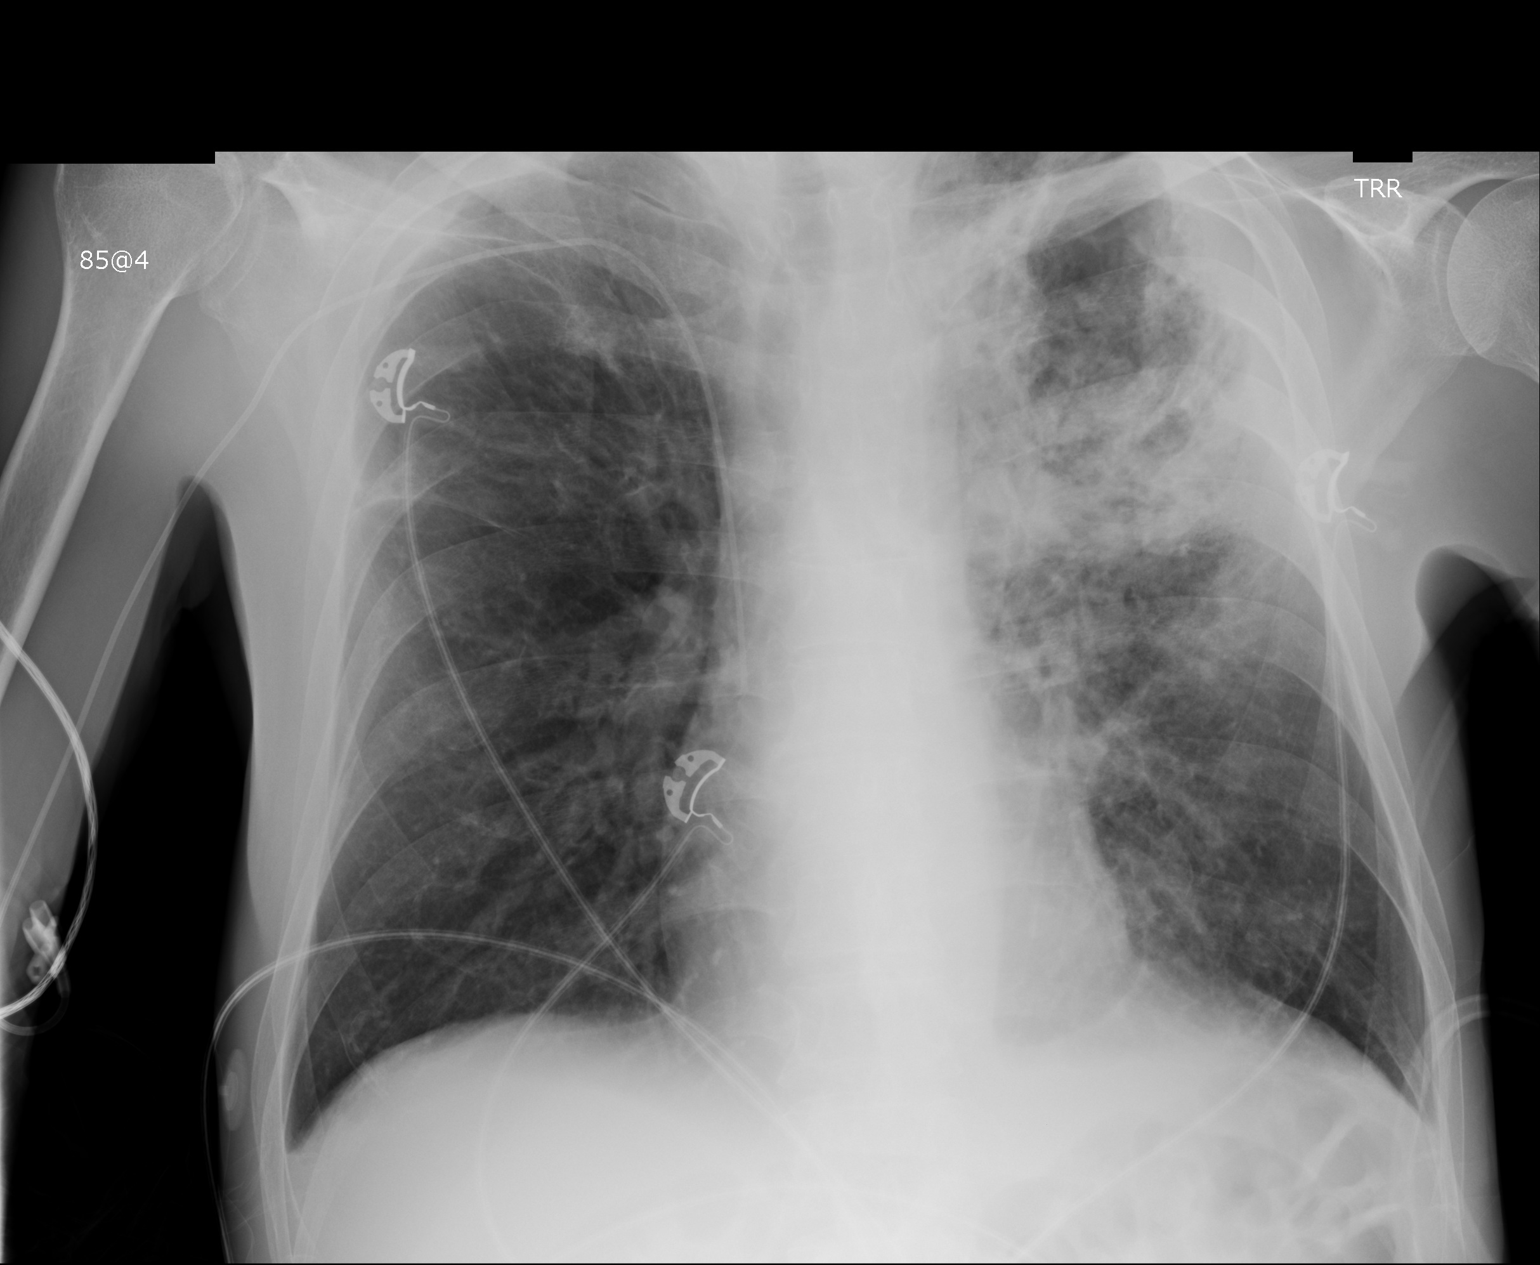

[1 of 1 positions shown; findings below may reference images not displayed]

FINDINGS: Right PICC line in good anatomic position. Large cavitary lesion in
the left upper lobe is again noted. Mild infiltrate in the right
upper lobe and left lung base cannot be excluded. Tiny pleural
effusions. No pneumothorax. Heart size normal.
IMPRESSION: 1. Persistent unchanged cavitary lesion in the left upper lobe.
2. Mild infiltrate right upper lobe and left lower lobe cannot be
excluded. Tiny bilateral pleural effusions.
3. Right PICC line stable position .

## 2024-06-08 ENCOUNTER — Other Ambulatory Visit: Payer: Self-pay

## 2024-06-08 DIAGNOSIS — Z87891 Personal history of nicotine dependence: Secondary | ICD-10-CM

## 2024-06-08 DIAGNOSIS — Z136 Encounter for screening for cardiovascular disorders: Secondary | ICD-10-CM

## 2024-06-19 ENCOUNTER — Telehealth: Payer: Self-pay

## 2024-06-19 DIAGNOSIS — Z87891 Personal history of nicotine dependence: Secondary | ICD-10-CM

## 2024-06-19 DIAGNOSIS — Z122 Encounter for screening for malignant neoplasm of respiratory organs: Secondary | ICD-10-CM

## 2024-06-19 NOTE — Telephone Encounter (Signed)
 Lung Cancer Screening Narrative/Criteria Questionnaire (Cigarette Smokers Only- No Cigars/Pipes/vapes)   Jonathan [name]   SDMV:06/22/2024 at 10:30 am Natalie        [birth year]               LDCT: 07/02/2024 at 1:00 pm [facility]    69 y.o.   Phone: [phone]   Lung Screening Narrative (confirm age 65-77 yrs Medicare / 50-80 yrs Private pay insurance)   Insurance information: [employer]   Referring Provider: Rickard, MD   This screening involves an initial phone call with a team member from our program. It is called a shared decision making visit. The initial meeting is required by  insurance and Medicare to make sure you understand the program. This appointment takes about 15-20 minutes to complete. You will complete the screening scan at your scheduled date/time.  This scan takes about 5-10 minutes to complete. You can eat and drink normally before and after the scan.  Criteria questions for Lung Cancer Screening:   Are you a current or former smoker? Former Age began smoking: 18   If you are a former smoker, what year did you quit smoking? Quit 2016 (within 15 yrs)   To calculate your smoking history, I need an accurate estimate of how many packs of cigarettes you smoked per day and for how many years. (Not just the number of PPD you are now smoking)   Years smoking 40 x Packs per day 1.5 = Pack years 60   (at least 20 pack yrs)   (Make sure they understand that we need to know how much they have smoked in the past, not just the number of PPD they are smoking now)  Do you have a personal history of cancer?  No    Do you have a family history of cancer? No  Are you coughing up blood?  No  Have you had unexplained weight loss of 15 lbs or more in the last 6 months? No  It looks like you meet all criteria.  When would be a good time for us  to schedule you for this screening?   Additional information: N/A

## 2024-06-22 ENCOUNTER — Encounter

## 2024-07-02 ENCOUNTER — Ambulatory Visit
